# Patient Record
Sex: Male | Born: 1970 | Race: White | Hispanic: No | Marital: Married | Smoking: Never smoker
Health system: Southern US, Community
[De-identification: ages and names within clinical notes are randomized; demographics above are authoritative.]

## PROBLEM LIST (undated history)

## (undated) DIAGNOSIS — M545 Low back pain, unspecified: Secondary | ICD-10-CM

## (undated) DIAGNOSIS — R51 Headache: Secondary | ICD-10-CM

## (undated) DIAGNOSIS — G030 Nonpyogenic meningitis: Secondary | ICD-10-CM

## (undated) DIAGNOSIS — E785 Hyperlipidemia, unspecified: Secondary | ICD-10-CM

## (undated) DIAGNOSIS — A6002 Herpesviral infection of other male genital organs: Secondary | ICD-10-CM

## (undated) DIAGNOSIS — G039 Meningitis, unspecified: Secondary | ICD-10-CM

## (undated) DIAGNOSIS — M7711 Lateral epicondylitis, right elbow: Secondary | ICD-10-CM

## (undated) DIAGNOSIS — T148XXA Other injury of unspecified body region, initial encounter: Secondary | ICD-10-CM

## (undated) DIAGNOSIS — R519 Headache, unspecified: Secondary | ICD-10-CM

## (undated) DIAGNOSIS — H5350 Unspecified color vision deficiencies: Secondary | ICD-10-CM

## (undated) HISTORY — DX: Meningitis, unspecified: G03.9

## (undated) HISTORY — DX: Low back pain, unspecified: M54.50

## (undated) HISTORY — DX: Headache, unspecified: R51.9

## (undated) HISTORY — DX: Other injury of unspecified body region, initial encounter: T14.8XXA

## (undated) HISTORY — DX: Nonpyogenic meningitis: G03.0

## (undated) HISTORY — PX: LATERAL EPICONDYLE RELEASE: SHX1958

## (undated) HISTORY — DX: Low back pain: M54.5

## (undated) HISTORY — DX: Hyperlipidemia, unspecified: E78.5

## (undated) HISTORY — DX: Unspecified color vision deficiencies: H53.50

## (undated) HISTORY — DX: Herpesviral infection of other male genital organs: A60.02

## (undated) HISTORY — DX: Headache: R51

---
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
## 1998-09-19 HISTORY — PX: LUMBAR DISC SURGERY: SHX700

## 1998-10-01 ENCOUNTER — Observation Stay (HOSPITAL_COMMUNITY): Admission: RE | Admit: 1998-10-01 | Discharge: 1998-10-02 | Payer: Self-pay | Admitting: Specialist

## 1998-10-01 ENCOUNTER — Encounter: Payer: Self-pay | Admitting: Specialist

## 1999-04-15 ENCOUNTER — Encounter: Admission: RE | Admit: 1999-04-15 | Discharge: 1999-07-14 | Payer: Self-pay | Admitting: Anesthesiology

## 2006-02-28 ENCOUNTER — Ambulatory Visit: Payer: Self-pay | Admitting: Family Medicine

## 2007-04-30 ENCOUNTER — Ambulatory Visit: Payer: Self-pay | Admitting: Family Medicine

## 2007-05-02 ENCOUNTER — Ambulatory Visit: Payer: Self-pay | Admitting: Family Medicine

## 2007-08-10 ENCOUNTER — Ambulatory Visit (HOSPITAL_COMMUNITY): Admission: RE | Admit: 2007-08-10 | Discharge: 2007-08-10 | Payer: Self-pay | Admitting: Anesthesiology

## 2007-08-23 ENCOUNTER — Encounter: Admission: RE | Admit: 2007-08-23 | Discharge: 2007-09-19 | Payer: Self-pay | Admitting: Neurological Surgery

## 2007-09-20 ENCOUNTER — Encounter: Admission: RE | Admit: 2007-09-20 | Discharge: 2007-10-31 | Payer: Self-pay | Admitting: Neurological Surgery

## 2007-09-20 HISTORY — PX: CERVICAL DISC SURGERY: SHX588

## 2007-10-12 ENCOUNTER — Encounter: Admission: RE | Admit: 2007-10-12 | Discharge: 2007-10-12 | Payer: Self-pay | Admitting: Neurological Surgery

## 2007-12-19 ENCOUNTER — Encounter: Admission: RE | Admit: 2007-12-19 | Discharge: 2007-12-19 | Payer: Self-pay | Admitting: Family Medicine

## 2007-12-19 ENCOUNTER — Ambulatory Visit: Payer: Self-pay | Admitting: Family Medicine

## 2007-12-21 ENCOUNTER — Encounter: Admission: RE | Admit: 2007-12-21 | Discharge: 2008-01-08 | Payer: Self-pay | Admitting: Family Medicine

## 2008-01-03 ENCOUNTER — Ambulatory Visit: Payer: Self-pay | Admitting: Family Medicine

## 2008-01-05 ENCOUNTER — Ambulatory Visit (HOSPITAL_COMMUNITY): Admission: RE | Admit: 2008-01-05 | Discharge: 2008-01-05 | Payer: Self-pay | Admitting: Family Medicine

## 2008-01-17 ENCOUNTER — Observation Stay (HOSPITAL_COMMUNITY): Admission: RE | Admit: 2008-01-17 | Discharge: 2008-01-18 | Payer: Self-pay | Admitting: Neurological Surgery

## 2008-02-19 ENCOUNTER — Encounter: Admission: RE | Admit: 2008-02-19 | Discharge: 2008-02-19 | Payer: Self-pay | Admitting: Neurological Surgery

## 2008-04-21 ENCOUNTER — Encounter: Admission: RE | Admit: 2008-04-21 | Discharge: 2008-04-21 | Payer: Self-pay | Admitting: Neurological Surgery

## 2008-07-20 HISTORY — PX: VASECTOMY: SHX75

## 2008-11-06 ENCOUNTER — Encounter: Admission: RE | Admit: 2008-11-06 | Discharge: 2008-11-06 | Payer: Self-pay | Admitting: Neurological Surgery

## 2009-06-15 ENCOUNTER — Ambulatory Visit: Payer: Self-pay | Admitting: Family Medicine

## 2009-09-07 ENCOUNTER — Ambulatory Visit: Payer: Self-pay | Admitting: Family Medicine

## 2010-02-24 ENCOUNTER — Ambulatory Visit: Payer: Self-pay | Admitting: Family Medicine

## 2010-03-14 ENCOUNTER — Emergency Department (HOSPITAL_COMMUNITY): Admission: EM | Admit: 2010-03-14 | Discharge: 2010-03-14 | Payer: Self-pay | Admitting: Family Medicine

## 2010-03-19 ENCOUNTER — Emergency Department (HOSPITAL_COMMUNITY): Admission: EM | Admit: 2010-03-19 | Discharge: 2010-03-19 | Payer: Self-pay | Admitting: Family Medicine

## 2010-03-23 ENCOUNTER — Ambulatory Visit: Payer: Self-pay | Admitting: Family Medicine

## 2011-01-21 ENCOUNTER — Ambulatory Visit (INDEPENDENT_AMBULATORY_CARE_PROVIDER_SITE_OTHER): Payer: 59 | Admitting: Family Medicine

## 2011-01-21 DIAGNOSIS — L258 Unspecified contact dermatitis due to other agents: Secondary | ICD-10-CM

## 2011-01-24 ENCOUNTER — Other Ambulatory Visit (INDEPENDENT_AMBULATORY_CARE_PROVIDER_SITE_OTHER): Payer: 59

## 2011-01-24 DIAGNOSIS — E7800 Pure hypercholesterolemia, unspecified: Secondary | ICD-10-CM

## 2011-02-01 ENCOUNTER — Telehealth: Payer: Self-pay | Admitting: Family Medicine

## 2011-02-01 NOTE — Telephone Encounter (Signed)
KNAPP ASKED PT TO CALL TO LET HER KNOW HOW HE WAS DOING.  STEROID CREAN HELPED TO CLEAN UP AREA BUT DID NOT CLEAR IT UP. TRIED ANTIFUNGAL CREAM BUT THAT MADE THE PROBLEM SPREAD. TRIED TO GET APPT W/DERMATOLOGIST BUT APPT NOT AVAIL UNTIL 2-3 WEEKS FROM NOW. PLEASE CALL IF HE NEED TO DO SOMETHING ELSE

## 2011-02-01 NOTE — Op Note (Signed)
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   PROCEDURE:  1. Decompressive anterior cervical diskectomy C5-6.  2. Anterior cervical arthrodesis C5-6 utilizing a 8-mm      corticocancellous allograft.  3. Anterior cervical plating C5-6 utilizing a Venture plate.   SURGEON:  Tia Alert, MD   ASSISTANT:  Donalee Citrin, MD   ANESTHESIA:  General endotracheal.   COMPLICATIONS:  None apparent.   INDICATIONS FOR PROCEDURE:  Mr. Arizpe is a very pleasant 40 year old  gentleman, who presented with an acute right C6 radiculopathy with  weakness in his hand and wrist extensors.  He had significant pain in C6  distribution.  He had an MRI which showed a soft disk herniation to the  right at C5-6.  I recommended decompressive anterior cervical  diskectomy, fusion plating at C5-6.  He understood the risks, benefits,  and  expected outcome and wished to proceed.   DESCRIPTION OF PROCEDURE:  The patient was taken to operating room and  after induction of adequate generalized endotracheal anesthesia, he was  placed in a supine position on the operating room table.  His right  anterior cervical region was prepped with DuraPrep and then draped in  usual sterile fashion.  Local anesthesia 5 mL was injected and a  transverse incision was made to the right of midline and carried down to  the platysma which was elevated, opened, and undermined with Metzenbaum  scissors.  I then dissected it in a plane medial to the  sternocleidomastoid muscle and internal carotid artery and lateral to  the trachea and esophagus to expose C5-6.  Intraoperative x-ray  confirmed my level and then the longus colli muscles were taken down and  the shadow line retractors were placed under this to expose C5-6.  We  incised the annulus and performed the initial diskectomy with the  pituitary rongeurs and the curved Karlin curettes.  We then used the  high-speed drill to drill the endplates to prepare for later  arthrodesis.  The operating microscope was brought into the field and  then the posterior and longitudinal ligament was opened with a nerve  hook and then removed in the circumferential fashion with undercutting  the bodies of C5-C6.  Bilateral foraminotomies were performed.  Particular attention was placed in the right foramen because of the  herniated disk.  We did found a significant disk herniation with a free  fragment over the C6 nerve root.  The C6 nerve root was identified by  marching along the pedicle identifying the nerve root and then marching  out pass the pedicle  level utilizing a 1 and 2-mm Kerrison punch.  A  generous foraminotomy was performed.  The nerve root was widely  decompressed.  We then palpated with a nerve hook into the foramen along  the nerve root to assure adequate decompression.  We then  circumferentially palpated to assure adequate decompression at central  canal.  The dura was full all the way across.  We felt like we had a  good decompression of the C6 nerve root in the midline.  We then  irrigated with saline solution.  We dried the surgical bed.  We measured  the interspace with the 8 mm and used an 8-mm corticocancellous  allograft and tapped this into position at C5-6.  We then used a Venture  plate and placed two 13-mm variable angled screws in the bodies of C5-C6  and these locked into plate by a locking mechanism within the plate.  We  then irrigated with saline solution  containing bacitracin, dried all  bleeding points with bipolar cautery, and once meticulous hemostasis was  achieved, closed the platysma with 3-0 Vicryl closing the subcuticular  tissue with 3-0 Vicryl, closed the skin with Benzoin and Steri-Strips.  The drapes were removed.  A sterile dressing was applied.  The patient  awakened from general anesthesia and transferred to recovery room in  stable condition.  At the end of procedure, all sponge, needle, and  instrument counts were correct.      Tia Alert, MD  Electronically Signed     DSJ/MEDQ  D:  01/17/2008  T:  01/17/2008  Job:  [id number]

## 2011-02-02 NOTE — Telephone Encounter (Signed)
Spoke with pt.  He will continue with the clobetasol, and step down to weaker Rx steroid he has if clearing up significantly.  Avoid using antifungal (made it worse).  F/u with derm in 2-3 weeks if not improving/resolving for consult (I rec make appt, cancel if better).

## 2011-05-29 ENCOUNTER — Inpatient Hospital Stay (INDEPENDENT_AMBULATORY_CARE_PROVIDER_SITE_OTHER)
Admission: RE | Admit: 2011-05-29 | Discharge: 2011-05-29 | Disposition: A | Discharge status: Home / Self Care | Payer: 59 | Source: Ambulatory Visit | Attending: Family Medicine | Admitting: Family Medicine

## 2011-05-29 DIAGNOSIS — J020 Streptococcal pharyngitis: Secondary | ICD-10-CM

## 2011-06-14 LAB — DIFFERENTIAL
Basophils Absolute: 0
Lymphocytes Relative: 29
Neutro Abs: 4.6
Neutrophils Relative %: 58

## 2011-06-14 LAB — BASIC METABOLIC PANEL
BUN: 11
Calcium: 9.4
GFR calc non Af Amer: >60
Glucose, Bld: 91
Sodium: 140

## 2011-06-14 LAB — CBC
Platelets: 198
RDW: 13.3
WBC: 8

## 2011-06-14 LAB — PROTIME-INR
INR: 1
Prothrombin Time: 12.9

## 2011-06-14 LAB — APTT: aPTT: 30

## 2011-06-22 ENCOUNTER — Encounter: Payer: Self-pay | Admitting: Family Medicine

## 2011-06-23 ENCOUNTER — Encounter: Payer: Self-pay | Admitting: Family Medicine

## 2011-06-23 ENCOUNTER — Ambulatory Visit (INDEPENDENT_AMBULATORY_CARE_PROVIDER_SITE_OTHER): Payer: 59 | Admitting: Family Medicine

## 2011-06-23 VITALS — BP 136/96 | HR 64 | Ht 72.0 in | Wt 220.0 lb

## 2011-06-23 DIAGNOSIS — IMO0001 Reserved for inherently not codable concepts without codable children: Secondary | ICD-10-CM

## 2011-06-23 DIAGNOSIS — G4700 Insomnia, unspecified: Secondary | ICD-10-CM

## 2011-06-23 DIAGNOSIS — Z0000 Encounter for general adult medical examination without abnormal findings: Secondary | ICD-10-CM

## 2011-06-23 DIAGNOSIS — E7800 Pure hypercholesterolemia, unspecified: Secondary | ICD-10-CM

## 2011-06-23 DIAGNOSIS — M545 Low back pain, unspecified: Secondary | ICD-10-CM

## 2011-06-23 DIAGNOSIS — R030 Elevated blood-pressure reading, without diagnosis of hypertension: Secondary | ICD-10-CM

## 2011-06-23 LAB — POCT URINALYSIS DIPSTICK
Ketones, UA: NEGATIVE
Leukocytes, UA: NEGATIVE
Nitrite, UA: NEGATIVE
Protein, UA: NEGATIVE
Urobilinogen, UA: NEGATIVE
pH, UA: 7

## 2011-06-23 MED ORDER — ZOLPIDEM TARTRATE 10 MG PO TABS: 10.0000 mg | ORAL_TABLET | Freq: Every evening | ORAL | Status: DC | PRN

## 2011-06-23 NOTE — Progress Notes (Signed)
Larry Cruz is a 40 y.o. male who presents for a complete physical.  He has the following concerns: He saw dermatologist for rash on hand, which after a skin scraping was diagnosed as ringworm.  He had already failed topical therapy, so was placed on oral antifungals, and required 2 months of treatment.  Is significantly improved.  Stopped the Lipitor during the time he was on anti-fungals, and has only restarted about 2 weeks ago.  Last lipids were 01/2011  Intermittently has problems with low back pain (since his surgery).  He finds that if he just gets some good sleep with Ambien, back improves on its own; rarely uses Flexeril or Rx anti-inflammatories (both prescribed for him in the past)  There is no immunization history on file for this patient. Got flu shot at work earlier this week.  Believes tetanus is UTD--will check with employee health Last colonoscopy: never Last PSA: never Dentist: twice yearly Ophtho: 2-3 years ago Exercise: Not much recently  Past Medical History  Diagnosis Date  . 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 Dyslipidemia   . Low back pain     Past Surgical History  Procedure Date  . Vasectomy 11/09  . Cervical disc surgery 2009    JONES, MD; fusion  . Lumbar disc surgery 2007    CARTER, MD; L4-L5    History   Social History  . Marital Status: Married    Spouse Name: N/A    Number of Children: 2  . Years of Education: N/A   Occupational History  . Rehab director [employer]   Social History Main Topics  . Smoking status: Never Smoker   . Smokeless tobacco: Never Used  . Alcohol Use: Yes     1 drink 3-4 times per week.  . Drug Use: No  . Sexually Active: Yes -- Male partner(s)    Birth Control/ Protection: Surgical     vasectomy   Other Topics Concern  . Not on file   Social History Narrative   Lives with wife, daughter, son and dog    Family History  Problem Relation Age of Onset  . Heart disease Father     CABG in mid 40's  .  Hypertension Father   . Alzheimer's disease Father   . Hyperlipidemia Father   . Diabetes Father   . Hyperlipidemia Mother   . Hyperlipidemia Sister   . Hyperlipidemia Brother   . Hypertension Brother   . Hyperlipidemia Brother   . Hyperlipidemia Sister   . Hyperlipidemia Sister   . Hyperlipidemia Sister   . Hyperlipidemia Sister   . Hyperlipidemia Sister   . Hyperlipidemia Sister     Current outpatient prescriptions:atorvastatin (LIPITOR) 20 MG tablet, Take 20 mg by mouth daily.  , Disp: , Rfl: ;  zolpidem (AMBIEN) 10 MG tablet, Take 1 tablet (10 mg total) by mouth at bedtime as needed., Disp: 30 tablet, Rfl: 1  No Known Allergies  ROS: The patient denies anorexia, fever, weight changes, headaches,  vision loss, decreased hearing, ear pain, hoarseness, chest pain, palpitations, dizziness, syncope, dyspnea on exertion, cough, swelling, nausea, vomiting, diarrhea, abdominal pain, melena, indigestion/heartburn, hematuria, incontinence, erectile dysfunction, nocturia, weakened urine stream, dysuria, genital lesions, joint pains, numbness, tingling, weakness, tremor, suspicious skin lesions, depression, anxiety, abnormal bleeding/bruising, or enlarged lymph nodes +constipation, intermittent BRB, known hemorrhoid in past; intermittent LBP relieved by OTC meds. Previously used Flexeril and rx NSAID's, not needed recently.  Rash L hand resolving.  Some pain in  knees, ankles, shoulder  PHYSICAL EXAM: BP 128/90  Pulse 64  Ht 6' (1.829 m)  Wt 220 lb (99.791 kg)  BMI 29.84 kg/m2  General Appearance:    Alert, cooperative, no distress, appears stated age  Head:    Normocephalic, without obvious abnormality, atraumatic  Eyes:    PERRL, conjunctiva/corneas clear, EOM's intact, fundi    benign  Ears:    Normal TM's and external ear canals  Nose:   Nares normal, mucosa normal, no drainage or sinus   tenderness  Throat:   Lips, mucosa, and tongue normal; teeth and gums normal  Neck:   Supple, no  lymphadenopathy;  thyroid:  no   enlargement/tenderness/nodules; no carotid   bruit or JVD  Back:    Spine nontender, no curvature, ROM normal, no CVA     tenderness  Lungs:     Clear to auscultation bilaterally without wheezes, rales or     ronchi; respirations unlabored  Chest Wall:    No tenderness or deformity   Heart:    Regular rate and rhythm, S1 and S2 normal, no murmur, rub   or gallop  Breast Exam:    No chest wall tenderness, masses or gynecomastia  Abdomen:     Soft, non-tender, nondistended, normoactive bowel sounds,    no masses, no hepatosplenomegaly. Abdominal obesity  Genitalia:    Normal male external genitalia without lesions.  Testicles without masses.  No inguinal hernias.  Rectal:    Normal sphincter tone, no masses or tenderness; guaiac negative stool.  Prostate smooth, no nodules, not enlarged.  Extremities:   No clubbing, cyanosis or edema  Pulses:   2+ and symmetric all extremities  Skin:   Skin color, texture, turgor normal, no rashes or lesions. Slight residual discoloration at L hand, otherwise normal  Lymph nodes:   Cervical, supraclavicular, and axillary nodes normal  Neurologic:   CNII-XII intact, normal strength, sensation and gait; reflexes 1+ and symmetric throughout          Psych:   Normal mood, affect, hygiene and grooming.     ASSESSMENT/PLAN: 1. Routine general medical examination at a health care facility  POCT Urinalysis Dipstick, Visual acuity screening  2. Insomnia  zolpidem (AMBIEN) 10 MG tablet  3. Pure hypercholesterolemia    4. Elevated BP    5. Lumbago     Lipids, glu and lft's recommended for spring (April/May)  PSA screening at age 49, recommended at least 30 minutes of aerobic activity at least 5 days/week; proper sunscreen use reviewed; healthy diet and alcohol recommendations (less than or equal to 2 drinks/day) reviewed; regular seatbelt use; changing batteries in smoke detectors. Self-testicular exams. Immunization recommendations  discussed.  Colonoscopy recommendations reviewed--age 41, sooner if anyone in family with polyps.  TdaP recommended if he hasn't already had. Will check employee health records and set up NV if needed  Exercise, weight loss, check BP elsewhere. F/u 6 months--on BP and due for labs

## 2011-06-23 NOTE — Patient Instructions (Addendum)
HEALTH MAINTENANCE RECOMMENDATIONS:  It is recommended that you get at least 30 minutes of aerobic exercise at least 5 days/week (for weight loss, you may need as much as 60-90 minutes). This can be any activity that gets your heart rate up. This can be divided in 10-15 minute intervals if needed, but try and build up your endurance at least once a week.  Weight bearing exercise is also recommended twice weekly.  Eat a healthy diet with lots of vegetables, fruits and fiber.  "Colorful" foods have a lot of vitamins (ie green vegetables, tomatoes, red peppers, etc).  Limit sweet tea, regular sodas and alcoholic beverages, all of which has a lot of calories and sugar.  Up to 2 alcoholic drinks daily may be beneficial for men (unless trying to lose weight, watch sugars).  Drink a lot of water.  Sunscreen of at least SPF 30 should be used on all sun-exposed parts of the skin when outside between the hours of 10 am and 4 pm (not just when at beach or pool, but even with exercise, golf, tennis, and yard work!)  Use a sunscreen that says "broad spectrum" so it covers both UVA and UVB rays, and make sure to reapply every 1-2 hours.  Remember to change the batteries in your smoke detectors when changing your clock times in the spring and fall.  Use your seat belt every time you are in a car, and please drive safely and not be distracted with cell phones and texting while driving.  Periodically check your testicles in the shower as instructed.  Please check with employee health regarding date of last tetanus, and whether it was Td or TdaP (TdaP is recommended if you never had one) Check blood pressure periodically and keep a list--bring to your appt in 6 months

## 2011-10-26 ENCOUNTER — Other Ambulatory Visit: Payer: Self-pay | Admitting: Family Medicine

## 2011-12-28 ENCOUNTER — Ambulatory Visit: Payer: 59 | Admitting: Family Medicine

## 2012-07-25 ENCOUNTER — Telehealth: Payer: Self-pay | Admitting: Family Medicine

## 2012-07-25 NOTE — Telephone Encounter (Signed)
Spoke with patient and he scheduled for 10/22/12, called in #30 with no refills.

## 2012-07-25 NOTE — Telephone Encounter (Signed)
I was going to refuse this medication, wanted to double check with you first. Pt last seen 06/23/11 for CPE and cx 12/28/11 appt.  Thanks.

## 2012-07-25 NOTE — Telephone Encounter (Signed)
Pt not seen in a year, but has only used 60 tabs in entire year.  Call him to schedule CPE, and if he schedules, okay to give #30, no refill

## 2012-08-06 ENCOUNTER — Telehealth: Payer: Self-pay | Admitting: *Deleted

## 2012-08-06 NOTE — Telephone Encounter (Signed)
Error

## 2012-10-05 ENCOUNTER — Other Ambulatory Visit: Payer: Self-pay | Admitting: Family Medicine

## 2012-10-22 ENCOUNTER — Ambulatory Visit (INDEPENDENT_AMBULATORY_CARE_PROVIDER_SITE_OTHER): Payer: 59 | Admitting: Family Medicine

## 2012-10-22 ENCOUNTER — Encounter: Payer: Self-pay | Admitting: Family Medicine

## 2012-10-22 VITALS — BP 138/84 | HR 64 | Ht 73.0 in | Wt 225.0 lb

## 2012-10-22 DIAGNOSIS — R002 Palpitations: Secondary | ICD-10-CM

## 2012-10-22 DIAGNOSIS — E7800 Pure hypercholesterolemia, unspecified: Secondary | ICD-10-CM

## 2012-10-22 DIAGNOSIS — Z0000 Encounter for general adult medical examination without abnormal findings: Secondary | ICD-10-CM

## 2012-10-22 DIAGNOSIS — R030 Elevated blood-pressure reading, without diagnosis of hypertension: Secondary | ICD-10-CM

## 2012-10-22 DIAGNOSIS — Z23 Encounter for immunization: Secondary | ICD-10-CM

## 2012-10-22 DIAGNOSIS — IMO0001 Reserved for inherently not codable concepts without codable children: Secondary | ICD-10-CM

## 2012-10-22 LAB — LIPID PANEL
HDL: 53 mg/dL (ref >39)
LDL Cholesterol: 130 mg/dL — ABNORMAL HIGH (ref 0–99)

## 2012-10-22 LAB — COMPREHENSIVE METABOLIC PANEL
ALT: 29 U/L (ref 0–53)
Alkaline Phosphatase: 74 U/L (ref 39–117)
CO2: 28 mEq/L (ref 19–32)
Sodium: 141 mEq/L (ref 135–145)
Total Bilirubin: 0.9 mg/dL (ref 0.3–1.2)
Total Protein: 7.4 g/dL (ref 6.0–8.3)

## 2012-10-22 LAB — POCT URINALYSIS DIPSTICK
Bilirubin, UA: NEGATIVE
Blood, UA: NEGATIVE
Nitrite, UA: NEGATIVE
pH, UA: 6

## 2012-10-22 LAB — HEMOCCULT GUIAC POC 1CARD (OFFICE)

## 2012-10-22 NOTE — Patient Instructions (Addendum)
HEALTH MAINTENANCE RECOMMENDATIONS:  It is recommended that you get at least 30 minutes of aerobic exercise at least 5 days/week (for weight loss, you may need as much as 60-90 minutes). This can be any activity that gets your heart rate up. This can be divided in 10-15 minute intervals if needed, but try and build up your endurance at least once a week.  Weight bearing exercise is also recommended twice weekly.  Eat a healthy diet with lots of vegetables, fruits and fiber.  "Colorful" foods have a lot of vitamins (ie green vegetables, tomatoes, red peppers, etc).  Limit sweet tea, regular sodas and alcoholic beverages, all of which has a lot of calories and sugar.  Up to 2 alcoholic drinks daily may be beneficial for men (unless trying to lose weight, watch sugars).  Drink a lot of water.  Sunscreen of at least SPF 30 should be used on all sun-exposed parts of the skin when outside between the hours of 10 am and 4 pm (not just when at beach or pool, but even with exercise, golf, tennis, and yard work!)  Use a sunscreen that says "broad spectrum" so it covers both UVA and UVB rays, and make sure to reapply every 1-2 hours.  Remember to change the batteries in your smoke detectors when changing your clock times in the spring and fall.  Use your seat belt every time you are in a car, and please drive safely and not be distracted with cell phones and texting while driving.  Elevated BP--continue to monitor.  Goal is <135/85.  Continue low sodium diet, daily exercise, and weight loss is encouraged.  Palpitations with exercise--increase fluid intake, and limit caffeine.  If ongoing problems, needs referral to cardiologist for further evaluation.  Please call us if this persists, for referral.

## 2012-10-22 NOTE — Progress Notes (Signed)
Chief Complaint  Patient presents with  . Annual Exam    fasting annual exam. Has joint pain in his feet and knees, notices when he is more active. Standing makes his feet hurt. Was having some heart flutters and had them do an EKG at work, said it was okay. Notices this when he get "real into" a tennis game.   Larry Cruz is a 42 y.o. male who presents for a complete physical.  He is also here to f/u on hyperlipidemia:  He is past due for labs, is fasting today.  Compliant with meds and diet, denies side effects.  November--noticed a muscle spasm/quivering in chest.  Had EKG done at work, which was reportedly okay.  Pulse was a little irregular; had more caffeine that day.  Sometimes feels similar when playing singles in tennis, only when playing hard. Doesn't notice with other physical exertion.  Denies chest tightness/pain/pressure.  Health Maintenance: Immunization History  Administered Date(s) Administered  . Influenza Split 07/03/2012  . Tdap 10/22/2012  believes tetanus was early 2000's (poss 2003) through employee health Last colonoscopy: never  Last PSA: never  Dentist: twice yearly  Ophtho: 3-4 years ago  Exercise: weight class once weekly; tennis 1-3x/week (only once recently)  Checks BP periodically elsewhere, and runs high 120's to low 140's/mid-70's-80's.    ROS:  The patient denies anorexia, fever, weight changes, headaches,  vision loss, decreased hearing, ear pain, hoarseness, chest pain, dizziness, syncope, dyspnea on exertion, cough, swelling, nausea, vomiting, diarrhea, abdominal pain, melena, hematochezia, indigestion/heartburn, hematuria, incontinence, erectile dysfunction, nocturia, weakened urine stream, dysuria, genital lesions, joint pains, numbness, tingling, weakness, tremor, suspicious skin lesions, depression, anxiety, abnormal bleeding/bruising, or enlarged lymph nodes. Some constipation, chronic. Insomnia--uses ambien on average once a week, but more  typically may need it 2-3x/week, then go 6 weeks without needing it.  PHYSICAL EXAM: BP 138/84  Pulse 64  Ht 6\' 1"  (1.854 m)  Wt 225 lb (102.059 kg)  BMI 29.69 kg/m2 138/84 on repeat by MD RA General Appearance:    Alert, cooperative, no distress, appears stated age  Head:    Normocephalic, without obvious abnormality, atraumatic  Eyes:    PERRL, conjunctiva/corneas clear, EOM's intact, fundi    benign  Ears:    Normal TM's and external ear canals  Nose:   Nares normal, mucosa normal, no drainage or sinus   tenderness  Throat:   Lips, mucosa, and tongue normal; teeth and gums normal  Neck:   Supple, no lymphadenopathy;  thyroid:  no   enlargement/tenderness/nodules; no carotid   bruit or JVD  Back:    Spine nontender, no curvature, ROM normal, no CVA     tenderness  Lungs:     Clear to auscultation bilaterally without wheezes, rales or     ronchi; respirations unlabored  Chest Wall:    No tenderness or deformity   Heart:    Regular rate and rhythm, S1 and S2 normal, no murmur, rub   or gallop  Breast Exam:    No chest wall tenderness, masses or gynecomastia  Abdomen:     Soft, non-tender, nondistended, normoactive bowel sounds,    no masses, no hepatosplenomegaly. Abdominal obesity.  Genitalia:    Normal male external genitalia without lesions.  Testicles without masses.  No inguinal hernias.  Rectal:    Normal sphincter tone, no masses or tenderness; guaiac negative stool.  Prostate smooth, no nodules, not enlarged.  Extremities:   No clubbing, cyanosis or edema  Pulses:  2+ and symmetric all extremities  Skin:   Skin color, texture, turgor normal, no rashes or lesions  Lymph nodes:   Cervical, supraclavicular, and axillary nodes normal  Neurologic:   CNII-XII intact, normal strength, sensation and gait; reflexes 2+ and symmetric throughout          Psych:   Normal mood, affect, hygiene and grooming.     ASSESSMENT/PLAN:  1. Routine general medical examination at a health care  facility  Visual acuity screening, POCT Urinalysis Dipstick, Hemoccult - 1 Card (office)  2. Pure hypercholesterolemia  Lipid panel, Comprehensive metabolic panel, atorvastatin (LIPITOR) 20 MG tablet  3. Need for Tdap vaccination  Tdap vaccine greater than or equal to 7yo IM  4. Elevated blood pressure  Comprehensive metabolic panel  5. Palpitations  TSH    Palpitations with exercise--increase fluid intake, and limit caffeine.  If ongoing problems, needs referral to cardiologist for further evaluation.    Elevated BP--continue to monitor, lower at home, some white coat component.  Goal is <135/85.  Continue low sodium diet, daily exercise, and weight loss is encouraged.  Recommended at least 30 minutes of aerobic activity at least 5 days/week; proper sunscreen use reviewed; healthy diet and alcohol recommendations (less than or equal to 2 drinks/day) reviewed; regular seatbelt use; changing batteries in smoke detectors. Self-testicular exams. Immunization recommendations discussed--TdaP given today.  Colonoscopy recommendations reviewed--age 74.

## 2012-10-23 LAB — TSH: TSH: 1.168 u[IU]/mL (ref 0.350–4.500)

## 2012-10-23 MED ORDER — ATORVASTATIN CALCIUM 20 MG PO TABS: 20.0000 mg | ORAL_TABLET | Freq: Every day | ORAL | Status: DC

## 2013-01-10 ENCOUNTER — Encounter: Payer: Self-pay | Admitting: Family Medicine

## 2013-01-10 ENCOUNTER — Ambulatory Visit (INDEPENDENT_AMBULATORY_CARE_PROVIDER_SITE_OTHER): Payer: 59 | Admitting: Family Medicine

## 2013-01-10 VITALS — BP 132/90 | HR 64 | Ht 72.25 in | Wt 202.0 lb

## 2013-01-10 DIAGNOSIS — M545 Low back pain, unspecified: Secondary | ICD-10-CM

## 2013-01-10 DIAGNOSIS — M5416 Radiculopathy, lumbar region: Secondary | ICD-10-CM

## 2013-01-10 DIAGNOSIS — IMO0002 Reserved for concepts with insufficient information to code with codable children: Secondary | ICD-10-CM

## 2013-01-10 MED ORDER — METHYLPREDNISOLONE 4 MG PO KIT: PACK | ORAL | Status: DC

## 2013-01-10 NOTE — Progress Notes (Signed)
Chief Complaint  Patient presents with  . back and hip pain    2 week ago fell on a flight of stair on spine, back and right hip pain and now constanly   Slipped on stairs about 2 weeks ago (wearing socks on hard wood).  The corner of step hit his L4-L5 area--landed straight on this area, from standing position, to about 2 stairs lower, on his low back. Had immediate onset of pain, with some swelling and bruising.  Pain has worsened in the last 48 hours.  He has some pain radiating down his right leg (lateral lower leg)--burning pain.  He has had some pain in this area since his surgery, but is worse now.  He is complaining of right hip pain.  Pain is across right low back, into buttock and right hip, some pain anteriorly.  Pain seems to be getting worse lying down and sitting, feels better standing.  Icing helps some.  Has taken ibuprofen 200-600mg  just a few times (10 pills total).  Saw some blood in the stool about a week ago, which he tends to get only when takes ibuprofen. He has also taken naproxen once daily, and tylenol every other day. He can't notice any significant improvement with these pain meds.  He has a dull ache across entire lower lumbar area  He has a h/o low back surgery, in 1999 or 2000.  Had ongoing problems since the surgery.  Had MRI in 2008, and epidural in 2009, with good results until this recent fall.  Last MRI (2008): IMPRESSION:  1. Previous posterior decompressive surgery at L4-5. Moderate broad-based disc protrusion more pronounced towards the right. Narrowing of the lateral recesses, right more than left. Either L5 nerve root could be affected at this level, more likely the right.  2. Broad-based disc protrusion at L5-S1 with caudal down-turning of disc material. Subarticular lateral recesses are narrowed and disc material abuts the S1 nerve roots. Either could be irritated though neither appears grossly compressed.   He spoke with Dr. Marikay Alar today, who suggested  repeat MRI.   Patient denies any weakness, bowel or bladder dysfunction.  Past Medical History  Diagnosis Date  . 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 Dyslipidemia   . Low back pain   . Colorblind    Past Surgical History  Procedure Laterality Date  . Vasectomy  11/09  . Cervical disc surgery  2009    JONES, MD; fusion  . Lumbar disc surgery  2007    CARTER, MD; L4-L5   History   Social History  . Marital Status: Married    Spouse Name: N/A    Number of Children: 2  . Years of Education: N/A   Occupational History  . 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 Smoking status: Never Smoker   . Smokeless tobacco: Never Used  . Alcohol Use: Yes     Comment: 1 drink 3-4 times per week.  . Drug Use: No  . Sexually Active: Yes -- Male partner(s)    Birth Control/ Protection: Surgical     Comment: vasectomy   Other Topics Concern  . Not on file   Social History Narrative   Lives with wife, daughter, son and dog   Current Outpatient Prescriptions on File Prior to Visit  Medication Sig Dispense Refill  . atorvastatin (LIPITOR) 20 MG tablet Take 1 tablet (20 mg total) by mouth daily.  90 tablet  3  . zolpidem (  AMBIEN) 10 MG tablet TAKE 1 TABLET BY MOUTH AT BEDTIME AS NEEDED  30 tablet  0   No current facility-administered medications on file prior to visit.   No Known Allergies  ROS:  Denies fevers, rash, bleeding/bruising, numbness/tingling/weakness, urinary complaints, bowel changes.  +back pain, radiating into RLE as per HPI.  No chest pain, shortness of breath, URI symptoms or other complaints.  PHYSICAL EXAM BP 132/90  Pulse 64  Ht 6' 0.25" (1.835 m)  Wt 202 lb (91.627 kg)  BMI 27.21 kg/m2 Well developed, pleasant male, who doesn't appear to be in acute distress Spine: Mildly diffusely tender across lower back.  No focal tenderness at spine, SI joint or sciatic notch.  NO CVA tenderness. No pyriformis spasm.  Strength 5/5, normal  sensation. ?decreased/absent reflex at R ankle, only 1+ at left Skin: no bruising/rash   ASSESSMENT/PLAN:  Lumbar radiculopathy - Plan: DG Lumbar Spine Complete, methylPREDNISolone (MEDROL, PAK,) 4 MG tablet  Lumbago - Plan: DG Lumbar Spine Complete  Discussed treatments including NSAIDs vs steroids.  Elects for a course of oral steroids, hoping for some relief in symptoms while awaiting other studies and determination for MRI.  If no improvement with steroids, to call for MRI.  Will start with regular x-rays given acute trauma, r/o fracture (compression). Risks and side effects of oral steroids reviewed, not to take NSAIDs along with med.  May call for stronger pain meds if needed (declines today).  Pt to call and let us know if symptoms persist, for MRI.  Will f/u with Dr. Yetta Barre sooner if develops worsening neuro symptoms/weakness.

## 2013-01-10 NOTE — Patient Instructions (Addendum)
Get x-rays at the hospital.  Start steroid dosepak--take as directed.  If symptoms not improving, call and we will get you scheduled for MRI.

## 2013-01-11 ENCOUNTER — Encounter: Payer: Self-pay | Admitting: Family Medicine

## 2013-01-11 ENCOUNTER — Ambulatory Visit (HOSPITAL_COMMUNITY)
Admission: RE | Admit: 2013-01-11 | Discharge: 2013-01-11 | Disposition: A | Discharge status: Home / Self Care | Payer: 59 | Source: Ambulatory Visit | Attending: Family Medicine | Admitting: Family Medicine

## 2013-01-11 DIAGNOSIS — M5416 Radiculopathy, lumbar region: Secondary | ICD-10-CM

## 2013-01-11 DIAGNOSIS — M545 Low back pain, unspecified: Secondary | ICD-10-CM | POA: Insufficient documentation

## 2013-01-11 DIAGNOSIS — M79609 Pain in unspecified limb: Secondary | ICD-10-CM | POA: Insufficient documentation

## 2013-01-11 DIAGNOSIS — M51379 Other intervertebral disc degeneration, lumbosacral region without mention of lumbar back pain or lower extremity pain: Secondary | ICD-10-CM | POA: Insufficient documentation

## 2013-01-11 DIAGNOSIS — W19XXXA Unspecified fall, initial encounter: Secondary | ICD-10-CM | POA: Insufficient documentation

## 2013-01-11 DIAGNOSIS — M5137 Other intervertebral disc degeneration, lumbosacral region: Secondary | ICD-10-CM | POA: Insufficient documentation

## 2013-03-02 ENCOUNTER — Emergency Department (HOSPITAL_COMMUNITY)
Admission: EM | Admit: 2013-03-02 | Discharge: 2013-03-02 | Disposition: A | Discharge status: Home / Self Care | Payer: 59 | Source: Home / Self Care

## 2013-03-02 ENCOUNTER — Encounter (HOSPITAL_COMMUNITY): Payer: Self-pay | Admitting: Emergency Medicine

## 2013-03-02 DIAGNOSIS — L255 Unspecified contact dermatitis due to plants, except food: Secondary | ICD-10-CM

## 2013-03-02 DIAGNOSIS — L237 Allergic contact dermatitis due to plants, except food: Secondary | ICD-10-CM

## 2013-03-02 MED ORDER — LORATADINE 10 MG PO TABS: ORAL_TABLET | ORAL | Status: DC

## 2013-03-02 MED ORDER — PREDNISONE 10 MG PO TABS: 20.0000 mg | ORAL_TABLET | Freq: Every day | ORAL | Status: DC

## 2013-03-02 MED ORDER — DIPHENHYDRAMINE HCL 25 MG PO TABS: ORAL_TABLET | ORAL | Status: DC

## 2013-03-02 NOTE — ED Provider Notes (Signed)
History     CSN: [id number]  Arrival date & time 03/02/13  1249   First MD Initiated Contact with Patient 03/02/13 1353      Chief Complaint  Patient presents with  . Poison Ivy    (Consider location/radiation/quality/duration/timing/severity/associated sxs/prior treatment) Patient is a 42 y.o. male presenting with poison ivy.  Poison Lajoyce Corners This is a new problem. The current episode started 2 days ago. The problem has not changed since onset.Pertinent negatives include no chest pain, no abdominal pain, no headaches and no shortness of breath. Nothing aggravates the symptoms. The symptoms are relieved by medications (benadryl).    Past Medical History  Diagnosis Date  . 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 Dyslipidemia   . Low back pain   . Colorblind     Past Surgical History  Procedure Laterality Date  . Vasectomy  11/09  . Cervical disc surgery  2009    JONES, MD; fusion  . Lumbar disc surgery  2007    CARTER, MD; L4-L5    Family History  Problem Relation Age of Onset  . Heart disease Father     CABG in mid 11's  . Hypertension Father   . Alzheimer's disease Father   . Hyperlipidemia Father   . Diabetes Father   . Hyperlipidemia Mother   . Hyperlipidemia Sister   . Hyperlipidemia Brother   . Hypertension Brother   . Hyperlipidemia Brother   . Hyperlipidemia Sister   . Hyperlipidemia Sister   . Hyperlipidemia Sister   . Hyperlipidemia Sister   . Hyperlipidemia Sister   . Hyperlipidemia Sister     History  Substance Use Topics  . Smoking status: Never Smoker   . Smokeless tobacco: Never Used  . Alcohol Use: Yes     Comment: 1 drink 3-4 times per week.      Review of Systems  Respiratory: Negative for shortness of breath.   Cardiovascular: Negative for chest pain.  Gastrointestinal: Negative for abdominal pain.  Neurological: Negative for headaches.  All other systems reviewed and are negative.    Allergies  Review of patient's allergies indicates no  known allergies.  Home Medications   Current Outpatient Rx  Name  Route  Sig  Dispense  Refill  . atorvastatin (LIPITOR) 20 MG tablet   Oral   Take 1 tablet (20 mg total) by mouth daily.   90 tablet   3   . diphenhydrAMINE (BENADRYL) 25 MG tablet      1-2 tablets at bedtime when needed   20 tablet   0   . loratadine (CLARITIN) 10 MG tablet      1 tablet once a day for 10 days when needed   90 tablet   1   . methylPREDNISolone (MEDROL, PAK,) 4 MG tablet      follow package directions   21 tablet   0   . predniSONE (DELTASONE) 10 MG tablet   Oral   Take 2 tablets (20 mg total) by mouth daily.   5 tablet   0   . zolpidem (AMBIEN) 10 MG tablet      TAKE 1 TABLET BY MOUTH AT BEDTIME AS NEEDED   30 tablet   0     BP 114/63  Pulse 64  Temp(Src) 98.2 F (36.8 C) (Oral)  Resp 14  Physical Exam  Nursing note and vitals reviewed. Constitutional: He is oriented to person, place, and time. He appears well-developed and well-nourished.  HENT:  Head: Normocephalic and  atraumatic.  Eyes: Conjunctivae are normal. Pupils are equal, round, and reactive to light.  Neck: Normal range of motion. Neck supple. No JVD present. No thyromegaly present.  erythmatic raised lesions nec, and two in left face  Cardiovascular: Normal rate, normal heart sounds and intact distal pulses.   Pulmonary/Chest: Effort normal and breath sounds normal.  Lymphadenopathy:    He has no cervical adenopathy.  Neurological: He is alert and oriented to person, place, and time.  Psychiatric: He has a normal mood and affect. His behavior is normal.    ED Course  Procedures (including critical care time)  Labs Reviewed - No data to display No results found.   1. Poison ivy       MDM  Discussed meds and tx.  SE and precautions reviewed        Maryelizabeth Rowan, MD 03/02/13 1404

## 2013-03-02 NOTE — ED Notes (Signed)
Pt c/o poss poison ivy rash onset Thursday... Rash on neck, side of face and groin area... Denies: fevers Has taken benadryl for sxs... He is alert and oriented w/no signs of acute distress.

## 2013-03-13 ENCOUNTER — Other Ambulatory Visit: Payer: Self-pay | Admitting: Family Medicine

## 2013-03-13 NOTE — Telephone Encounter (Signed)
I called out Ambien to the pharmacy per Crosby Oyster PA-C. CLS

## 2013-03-13 NOTE — Telephone Encounter (Signed)
Is it okay to refill?

## 2013-07-25 ENCOUNTER — Other Ambulatory Visit: Payer: Self-pay

## 2013-08-21 ENCOUNTER — Other Ambulatory Visit: Payer: Self-pay | Admitting: Neurological Surgery

## 2013-08-21 DIAGNOSIS — M5126 Other intervertebral disc displacement, lumbar region: Secondary | ICD-10-CM

## 2013-08-22 ENCOUNTER — Ambulatory Visit
Admission: RE | Admit: 2013-08-22 | Discharge: 2013-08-22 | Disposition: A | Discharge status: Home / Self Care | Payer: 59 | Source: Ambulatory Visit | Attending: Neurological Surgery | Admitting: Neurological Surgery

## 2013-08-22 DIAGNOSIS — M5126 Other intervertebral disc displacement, lumbar region: Secondary | ICD-10-CM

## 2013-08-23 ENCOUNTER — Other Ambulatory Visit: Payer: Self-pay | Admitting: Neurological Surgery

## 2013-08-23 ENCOUNTER — Other Ambulatory Visit: Payer: 59

## 2013-08-23 DIAGNOSIS — M545 Low back pain, unspecified: Secondary | ICD-10-CM

## 2013-08-27 ENCOUNTER — Ambulatory Visit
Admission: RE | Admit: 2013-08-27 | Discharge: 2013-08-27 | Disposition: A | Discharge status: Home / Self Care | Payer: 59 | Source: Ambulatory Visit | Attending: Neurological Surgery | Admitting: Neurological Surgery

## 2013-08-27 DIAGNOSIS — M545 Low back pain, unspecified: Secondary | ICD-10-CM

## 2013-08-27 MED ORDER — IOHEXOL 180 MG/ML  SOLN
1.0000 mL | Freq: Once | INTRAMUSCULAR | Status: AC | PRN
  Administered 2013-08-27: 1 mL via EPIDURAL

## 2013-08-27 MED ORDER — METHYLPREDNISOLONE ACETATE 40 MG/ML INJ SUSP (RADIOLOG
120.0000 mg | Freq: Once | INTRAMUSCULAR | Status: AC
  Administered 2013-08-27: 120 mg via EPIDURAL

## 2013-09-04 ENCOUNTER — Other Ambulatory Visit: Payer: Self-pay | Admitting: Neurological Surgery

## 2013-09-04 DIAGNOSIS — M545 Low back pain, unspecified: Secondary | ICD-10-CM

## 2013-09-05 ENCOUNTER — Encounter: Payer: Self-pay | Admitting: Family Medicine

## 2013-09-05 ENCOUNTER — Other Ambulatory Visit: Payer: Self-pay | Admitting: Medical

## 2013-09-05 MED ORDER — ZOLPIDEM TARTRATE 10 MG PO TABS: ORAL_TABLET | ORAL | Status: DC

## 2013-09-05 NOTE — Telephone Encounter (Signed)
Done

## 2013-09-09 ENCOUNTER — Ambulatory Visit
Admission: RE | Admit: 2013-09-09 | Discharge: 2013-09-09 | Disposition: A | Discharge status: Home / Self Care | Payer: 59 | Source: Ambulatory Visit | Attending: Neurological Surgery | Admitting: Neurological Surgery

## 2013-09-09 DIAGNOSIS — M545 Low back pain, unspecified: Secondary | ICD-10-CM

## 2013-09-09 MED ORDER — IOHEXOL 180 MG/ML  SOLN
1.0000 mL | Freq: Once | INTRAMUSCULAR | Status: AC | PRN
  Administered 2013-09-09: 1 mL via EPIDURAL

## 2013-09-09 MED ORDER — METHYLPREDNISOLONE ACETATE 40 MG/ML INJ SUSP (RADIOLOG
120.0000 mg | Freq: Once | INTRAMUSCULAR | Status: AC
  Administered 2013-09-09: 120 mg via EPIDURAL

## 2013-11-17 HISTORY — PX: LUMBAR DISC SURGERY: SHX700

## 2014-03-05 ENCOUNTER — Encounter: Payer: Self-pay | Admitting: Family Medicine

## 2014-03-05 ENCOUNTER — Ambulatory Visit (INDEPENDENT_AMBULATORY_CARE_PROVIDER_SITE_OTHER): Payer: 59 | Admitting: Family Medicine

## 2014-03-05 VITALS — BP 110/76 | HR 76 | Temp 97.8°F | Ht 72.25 in | Wt 216.0 lb

## 2014-03-05 DIAGNOSIS — Z79899 Other long term (current) drug therapy: Secondary | ICD-10-CM

## 2014-03-05 DIAGNOSIS — H60399 Other infective otitis externa, unspecified ear: Secondary | ICD-10-CM

## 2014-03-05 DIAGNOSIS — H6092 Unspecified otitis externa, left ear: Secondary | ICD-10-CM

## 2014-03-05 DIAGNOSIS — G4700 Insomnia, unspecified: Secondary | ICD-10-CM

## 2014-03-05 DIAGNOSIS — E7800 Pure hypercholesterolemia, unspecified: Secondary | ICD-10-CM

## 2014-03-05 MED ORDER — NEOMYCIN-POLYMYXIN-HC 3.5-10000-1 OT SOLN: 4.0000 [drp] | Freq: Four times a day (QID) | OTIC | Status: DC

## 2014-03-05 MED ORDER — ZOLPIDEM TARTRATE 10 MG PO TABS: ORAL_TABLET | ORAL | Status: DC

## 2014-03-05 NOTE — Patient Instructions (Signed)
Otitis Externa Otitis externa is a bacterial or fungal infection of the outer ear canal. This is the area from the eardrum to the outside of the ear. Otitis externa is sometimes called "swimmer's ear." CAUSES  Possible causes of infection include:  Swimming in dirty water.  Moisture remaining in the ear after swimming or bathing.  Mild injury (trauma) to the ear.  Objects stuck in the ear (foreign body).  Cuts or scrapes (abrasions) on the outside of the ear. SYMPTOMS  The first symptom of infection is often itching in the ear canal. Later signs and symptoms may include swelling and redness of the ear canal, ear pain, and yellowish-white fluid (pus) coming from the ear. The ear pain may be worse when pulling on the earlobe. DIAGNOSIS  Your caregiver will perform a physical exam. A sample of fluid may be taken from the ear and examined for bacteria or fungi. TREATMENT  Antibiotic ear drops are often given for 10 to 14 days. Treatment may also include pain medicine or corticosteroids to reduce itching and swelling. PREVENTION   Keep your ear dry. Use the corner of a towel to absorb water out of the ear canal after swimming or bathing.  Avoid scratching or putting objects inside your ear. This can damage the ear canal or remove the protective wax that lines the canal. This makes it easier for bacteria and fungi to grow.  Avoid swimming in lakes, polluted water, or poorly chlorinated pools.  You may use ear drops made of rubbing alcohol and vinegar after swimming. Combine equal parts of white vinegar and alcohol in a bottle. Put 3 or 4 drops into each ear after swimming. HOME CARE INSTRUCTIONS   Apply antibiotic ear drops to the ear canal as prescribed by your caregiver.  Only take over-the-counter or prescription medicines for pain, discomfort, or fever as directed by your caregiver.  If you have diabetes, follow any additional treatment instructions from your caregiver.  Keep all  follow-up appointments as directed by your caregiver. SEEK MEDICAL CARE IF:   You have a fever.  Your ear is still red, swollen, painful, or draining pus after 3 days.  Your redness, swelling, or pain gets worse.  You have a severe headache.  You have redness, swelling, pain, or tenderness in the area behind your ear. MAKE SURE YOU:   Understand these instructions.  Will watch your condition.  Will get help right away if you are not doing well or get worse. Document Released: 09/05/2005 Document Revised: 11/28/2011 Document Reviewed: 09/22/2011 [facility] Patient Information 2015 [place], [place]. This information is not intended to replace advice given to you by your health care provider. Make sure you discuss any questions you have with your health care provider.

## 2014-03-05 NOTE — Progress Notes (Signed)
Chief Complaint  Patient presents with  . Otalgia    left ear pain x 4-5 day, last 12-24 hours have been worse.    4-5 days ago he started with left ear discomfort.  It was worse yesterday, and very painful last night, awoke him from sleep.  He has some decreased/muffled hearing.  Jaw movements also increase pain.  Denies runny nose, sinus pain, sore throat, PND, cough (just mild seasonal throat/allergy symptoms).  He took tylenol and aleve today.  Denies fevers, chills. No sick contacts.  +swimming.  Asking for ambien refill--Last filled 08/2013.  He reports that a prescription of #30 usually lasts 6 months.  While dealing with his back pain/surgery he admits to needing to use it more frequently than usual.  Requesting refill today.  Hyperlipidemia--he reports having a month left on current prescription, but admitting to being past due for labs.  He reports that he didn't take it regularly while having back issues.  Only back to taking it regularly for the last week.  Last labs 10/2012  Past Medical History  Diagnosis Date  . 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 Dyslipidemia   . Low back pain   . Colorblind    Past Surgical History  Procedure Laterality Date  . Vasectomy  11/09  . Cervical disc surgery  2009    JONES, MD; fusion  . Lumbar disc surgery  2000    CARTER, MD; L4-L5 (right)  . Lumbar disc surgery  11/2013    L4-5, Dr. Ronnald Ramp (left)   History   Social History  . Marital Status: Married    Spouse Name: N/A    Number of Children: 2  . Years of Education: N/A   Occupational History  . 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 Smoking status: Never Smoker   . Smokeless tobacco: Never Used  . Alcohol Use: Yes     Comment: 1 drink 3-4 times per week.  . Drug Use: No  . Sexual Activity: Yes    Partners: Female    Birth Control/ Protection: Surgical     Comment: vasectomy   Other Topics Concern  . Not on file   Social History Narrative   Lives with  wife, daughter, son and dog   Outpatient Encounter Prescriptions as of 03/05/2014  Medication Sig Note  . atorvastatin (LIPITOR) 20 MG tablet Take 1 tablet (20 mg total) by mouth daily. 03/05/2014: Only restarted last week; not taking regularly while dealing with back pain/surgery  . zolpidem (AMBIEN) 10 MG tablet TAKE 1 TABLET BY MOUTH AT BEDTIME AS NEEDED 03/05/2014: Uses sporadically, usually #30 lasts 6 months.  Needed more frequently related to back pain  . [DISCONTINUED] diphenhydrAMINE (BENADRYL) 25 MG tablet 1-2 tablets at bedtime when needed   . [DISCONTINUED] zolpidem (AMBIEN) 10 MG tablet TAKE 1 TABLET BY MOUTH AT BEDTIME AS NEEDED 03/05/2014: (30 pills usually last 6 months, needed more related to back pain/surgery)  . neomycin-polymyxin-hydrocortisone (CORTISPORIN) otic solution Place 4 drops into the left ear 4 (four) times daily.   . [DISCONTINUED] loratadine (CLARITIN) 10 MG tablet 1 tablet once a day for 10 days when needed   . [DISCONTINUED] methylPREDNISolone (MEDROL, PAK,) 4 MG tablet follow package directions   . [DISCONTINUED] predniSONE (DELTASONE) 10 MG tablet Take 2 tablets (20 mg total) by mouth daily.    No Known Allergies  ROS:  Denies headaches, dizziness, chest pain, shortness of breath.  +ear pain as per HPI.  Denies other URI symptoms, cough, shortness of breath.  No GI complaints.  No bleeding/bruising, rash.  Back pain improved s/p surgery, not yet back to playing tennis yet.  PHYSICAL EXAM: BP 110/76  Pulse 76  Temp(Src) 97.8 F (36.6 C) (Oral)  Ht 6' 0.25" (1.835 m)  Wt 216 lb (97.977 kg)  BMI 29.10 kg/m2 Well developed, pleasant male in no distress HEENT:  PERRL ,EOMI, conjunctiva clear.  R TM and EAC normal. Tender at L TMJ, no clicking. L EAC is swollen with exudate.  Visualized portion of TM is normal, without erythema or bulging.  OP clear. Sinuses nontender. Nasal mucosa normal. Neck: no lymphadenopathy or mass Heart: regular rate and rhythm without  murmur Lungs: clear bilaterally Neuro: alert and oriented.  Cranial nerves intact. Normal gait Psych: normal mood, affect, hygiene and grooming.  ASSESSMENT/PLAN:  Otitis externa of left ear - Plan: neomycin-polymyxin-hydrocortisone (CORTISPORIN) otic solution  Insomnia - Plan: zolpidem (AMBIEN) 10 MG tablet  Pure hypercholesterolemia - Plan: Lipid panel  Encounter for long-term (current) use of other medications - Plan: Comprehensive metabolic panel, Lipid panel  L OE, cannot r/o TMJ issues contributing Continue Aleve as needed for pain Cortisporin otic to treat OE.  Insomnia--sporadic.  Refilled ambien  Hyperlipidemia--noncompliant; past due for labs, but only back on meds x 1 week.  Set up labs for 2 months, and schedule CPE  c-met, lipids

## 2014-03-07 ENCOUNTER — Telehealth: Payer: Self-pay

## 2014-03-07 DIAGNOSIS — E7800 Pure hypercholesterolemia, unspecified: Secondary | ICD-10-CM

## 2014-03-07 NOTE — Telephone Encounter (Signed)
Refill request for Atorvastatin 20mg  #90 [facility]

## 2014-03-10 ENCOUNTER — Ambulatory Visit (INDEPENDENT_AMBULATORY_CARE_PROVIDER_SITE_OTHER): Payer: 59 | Admitting: Medical

## 2014-03-10 ENCOUNTER — Encounter: Payer: Self-pay | Admitting: Medical

## 2014-03-10 VITALS — BP 110/80 | HR 60 | Temp 97.6°F | Resp 14 | Wt 212.0 lb

## 2014-03-10 DIAGNOSIS — H60392 Other infective otitis externa, left ear: Secondary | ICD-10-CM

## 2014-03-10 DIAGNOSIS — H60399 Other infective otitis externa, unspecified ear: Secondary | ICD-10-CM

## 2014-03-10 MED ORDER — ATORVASTATIN CALCIUM 20 MG PO TABS: 20.0000 mg | ORAL_TABLET | Freq: Every day | ORAL | Status: DC

## 2014-03-10 NOTE — Progress Notes (Addendum)
Subjective: Patient is a 43 y.o. male presenting for 10 day history of left ear fullness/pressure. He was last seen here by Dr. Tomi Bamberger on 03/05/2014 and diagnosed with Otitis Externa, prescribed Cortisporin and Aleve for pain. He admits compliance with medication and reports that his pain has improved but the fullness/pressure and decreased hearing persist. He now reports a headache and feels sharp pain at times like his ear "wants to pop". A few times, the pain has been worse when he applies the otic solution. He denies ringing in his ears but says that he sometimes feels he hears "crackles and pops". No fevers, sinus pressure, runny nose, jaw pain, throat pain, dizziness, vertigo, vision changes. He reports having seasonal allergies but hasn't had any difficulties with his sinuses recently. He notes that he has had more difficulty with his left ear recently and has needed to have it irrigated for cerumen. He did not have it irrigated last week when he was seen by Dr. Tomi Bamberger. No other aggravating or relieving factors.  The patient has no other questions or concerns.  ROS as in subjective.   Objective:  BP 110/80  Pulse 60  Temp(Src) 97.6 F (36.4 C) (Oral)  Resp 14  Wt 212 lb (96.163 kg)  General appearance: alert, no distress  HEENT: normocephalic, sclerae anicteric, Right TM partially occluded by cerumen otherwise intact, ear canal without erythema or discharge; Left TM is occluded by clumpy whitish discharge mixed with cerumen, ear canal has no discharge and is without erythema. Nares patent, no discharge or erythema, pharynx normal Oral cavity: MMM, no lesions Neck: supple, no lymphadenopathy, no thyromegaly, no masses   Assessment:  Encounter Diagnosis  Name Primary?  . Otitis, externa, infective, left Yes     Plan: Used currette to remove some of the left canal debris from left ear canal.  Inserted ear wick.   No apparent erythema or swollen canal.    Advised he give the  antibiotic drops more time, c/t Ibuprofen, avoid getting water in the ear.  If not much improved in 72 hours, consider ENT referral for better debridement.  Patient was seen in conjunction with PA student Jaynee Eagles, and I have also evaluated and examined patient, agree with student's notes, student supervised by me.

## 2014-03-10 NOTE — Telephone Encounter (Signed)
Done

## 2014-03-12 ENCOUNTER — Telehealth: Payer: Self-pay | Admitting: *Deleted

## 2014-03-12 DIAGNOSIS — G4700 Insomnia, unspecified: Secondary | ICD-10-CM

## 2014-03-12 MED ORDER — ZOLPIDEM TARTRATE 10 MG PO TABS: ORAL_TABLET | ORAL | Status: DC

## 2014-03-12 NOTE — Telephone Encounter (Signed)
Ok #30 this time only

## 2014-03-12 NOTE — Telephone Encounter (Signed)
Spoke with patient and he states that he had the rx bottle in his car, went into a store and accidentally left his car unlocked and when he got back to the care the bottle was gone. He searched his entire care several times so this will not turn up.

## 2014-03-12 NOTE — Telephone Encounter (Signed)
Larry Cruz, pharmacist @ [facility]. Larry Cruz filled his ambien rx last week and has lost the prescription. Pharmacist is asking for permission to fill early (3 weeks). If that is okay then they would actually need new rx phoned in as original did not have any refills. Thanks.

## 2014-03-12 NOTE — Telephone Encounter (Signed)
Called into pharmacy

## 2014-03-12 NOTE — Telephone Encounter (Signed)
Please call pt and find out details--if it might turn up again, then just give #10 to last until he can find bottle (since he stated that #30 usually lasts 6 months).  Advise him of policy of NOT doing this--would make exception just ONCE if he has a believable story

## 2014-03-17 ENCOUNTER — Telehealth: Payer: Self-pay | Admitting: Family Medicine

## 2014-03-17 NOTE — Telephone Encounter (Signed)
Patient schedule with Dr.Chris Lucia Gaskins for 03/21/14 @ 2:15pm. Patient aware.

## 2014-03-17 NOTE — Telephone Encounter (Signed)
Refer to ENT, as per Shane's note from a week ago

## 2014-04-17 ENCOUNTER — Encounter: Payer: Self-pay | Admitting: Family Medicine

## 2014-04-17 ENCOUNTER — Ambulatory Visit (INDEPENDENT_AMBULATORY_CARE_PROVIDER_SITE_OTHER): Payer: 59 | Admitting: Family Medicine

## 2014-04-17 VITALS — BP 130/80 | HR 72 | Ht 73.0 in | Wt 212.0 lb

## 2014-04-17 DIAGNOSIS — E7800 Pure hypercholesterolemia, unspecified: Secondary | ICD-10-CM

## 2014-04-17 DIAGNOSIS — Z0000 Encounter for general adult medical examination without abnormal findings: Secondary | ICD-10-CM

## 2014-04-17 LAB — POCT URINALYSIS DIPSTICK
Bilirubin, UA: NEGATIVE
Blood, UA: NEGATIVE
Glucose, UA: NEGATIVE
KETONES UA: NEGATIVE
Leukocytes, UA: NEGATIVE
Nitrite, UA: NEGATIVE
PROTEIN UA: NEGATIVE
SPEC GRAV UA: 1.01
Urobilinogen, UA: NEGATIVE
pH, UA: 6

## 2014-04-17 NOTE — Patient Instructions (Signed)
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
## 2014-05-05 ENCOUNTER — Other Ambulatory Visit: Payer: 59

## 2014-05-05 DIAGNOSIS — Z79899 Other long term (current) drug therapy: Secondary | ICD-10-CM

## 2014-05-05 DIAGNOSIS — E7800 Pure hypercholesterolemia, unspecified: Secondary | ICD-10-CM

## 2014-05-06 LAB — LIPID PANEL
CHOLESTEROL: 214 mg/dL — AB (ref 0–200)
HDL: 63 mg/dL (ref >39)
LDL Cholesterol: 138 mg/dL — ABNORMAL HIGH (ref 0–99)
TRIGLYCERIDES: 66 mg/dL (ref <150)
Total CHOL/HDL Ratio: 3.4 Ratio
VLDL: 13 mg/dL (ref 0–40)

## 2014-05-06 LAB — COMPREHENSIVE METABOLIC PANEL
ALBUMIN: 4.7 g/dL (ref 3.5–5.2)
ALT: 24 U/L (ref 0–53)
AST: 31 U/L (ref 0–37)
Alkaline Phosphatase: 78 U/L (ref 39–117)
BILIRUBIN TOTAL: 1 mg/dL (ref 0.2–1.2)
BUN: 14 mg/dL (ref 6–23)
CO2: 27 meq/L (ref 19–32)
Calcium: 9 mg/dL (ref 8.4–10.5)
Chloride: 101 mEq/L (ref 96–112)
Creat: 1.01 mg/dL (ref 0.50–1.35)
GLUCOSE: 93 mg/dL (ref 70–99)
Potassium: 4.3 mEq/L (ref 3.5–5.3)
SODIUM: 137 meq/L (ref 135–145)
Total Protein: 6.9 g/dL (ref 6.0–8.3)

## 2014-08-04 ENCOUNTER — Encounter: Payer: Self-pay | Admitting: Family Medicine

## 2014-08-04 ENCOUNTER — Ambulatory Visit (INDEPENDENT_AMBULATORY_CARE_PROVIDER_SITE_OTHER): Payer: 59 | Admitting: Family Medicine

## 2014-08-04 VITALS — BP 120/84 | HR 56 | Temp 97.7°F | Ht 73.0 in | Wt 205.0 lb

## 2014-08-04 DIAGNOSIS — J309 Allergic rhinitis, unspecified: Secondary | ICD-10-CM

## 2014-08-04 DIAGNOSIS — R059 Cough, unspecified: Secondary | ICD-10-CM

## 2014-08-04 DIAGNOSIS — R05 Cough: Secondary | ICD-10-CM

## 2014-08-04 MED ORDER — ALBUTEROL SULFATE HFA 108 (90 BASE) MCG/ACT IN AERS: 2.0000 | INHALATION_SPRAY | Freq: Four times a day (QID) | RESPIRATORY_TRACT | Status: DC | PRN

## 2014-08-04 MED ORDER — BENZONATATE 200 MG PO CAPS: 200.0000 mg | ORAL_CAPSULE | Freq: Three times a day (TID) | ORAL | Status: DC | PRN

## 2014-08-04 NOTE — Patient Instructions (Signed)
  Drink plenty of fluids. Continue to use allergy medications regularly (zyrtec or Zyrtec-D). Restart Flonase, using 2 sprays (gentle sniffs) into each nostril daily. Continue guaifenesin (either plain Mucinex or the DM) Use the albuterol inhaler as needed for dry cough, chest tightness and shortness of breath. Use Tessalon (benzonatate) as needed for cough--this can be used during the day, and can be used along with Mucinex.  If your cough isn't improving over the next week, then let's get a chest x-ray (call us to have order entered) and then a course of steroids if no pneumonia seen

## 2014-08-04 NOTE — Progress Notes (Signed)
Chief Complaint  Patient presents with  . Cough    x 3 weeks. Felt like it was allergy related at first. Has moved down to his chest. When he lays on his left side feels like he is wheezing. Took zyrtec and zyrtec D without any relief. Mucinex DM up until last evening-not much help. Persisitant cough.Did cough up some white mucus.   He has been coughing on/off for about 3 weeks.  Feels like there is something in his chest.  He feels a tickle in his throat, postnasal drainage, but the Zyrtec hasn't been helping like usual. Cough is worse with exercise and lying down.  Pudding consistency to the phlegm that he sometimes gets up. Doesn't cough up mucus every day, just every couple of days.  Phlegm is white, and usually early in the day (before lunch).  No known fevers, chills. 2 days ago he had a headache from coughing so much.  Denies sinus headaches.  He has taken Zyrtec, Zyrtec-D and Mucinex-DM.  There has been no improvement in cough.  No sick contacts at home.  +friends with pneumonia, cough.  He tried his son's inhaler last week (without the aerochamber)--?if it helped.  PMH, PSH, SH reviewed  Outpatient Encounter Prescriptions as of 08/04/2014  Medication Sig Note  . atorvastatin (LIPITOR) 20 MG tablet Take 1 tablet (20 mg total) by mouth daily.   . Pseudoephedrine-APAP-DM (DAYQUIL PO) Take 30 mLs by mouth once.   Marland Kitchen zolpidem (AMBIEN) 10 MG tablet TAKE 1 TABLET BY MOUTH AT BEDTIME AS NEEDED 04/17/2014: Takes sporadically, prn  . albuterol (PROVENTIL HFA;VENTOLIN HFA) 108 (90 BASE) MCG/ACT inhaler Inhale 2 puffs into the lungs every 6 (six) hours as needed for wheezing or shortness of breath.   . benzonatate (TESSALON) 200 MG capsule Take 1 capsule (200 mg total) by mouth 3 (three) times daily as needed.   Marland Kitchen dextromethorphan-guaiFENesin (MUCINEX DM) 30-600 MG per 12 hr tablet Take 1 tablet by mouth 2 (two) times daily. 08/04/2014: Taking for a week (not yet today)  . naproxen sodium (ANAPROX)  220 MG tablet Take 440 mg by mouth 2 (two) times daily with a meal. 04/17/2014: Taking about 4-5 times per week.    (tessalon and albuterol rx'd today, not taking prior to visit)  No Known Allergies   ROS: No nausea, vomiting, diarrhea, rashes, bleeding, bruising, rashes. No fevers, chills, headaches, dizziness, ear pain.  No numbness/tingling/weakness. No urinary complaints. No myalgias/arthralgias.  See HPI  PHYSICAL EXAM: BP 120/84 mmHg  Pulse 56  Temp(Src) 97.7 F (36.5 C) (Tympanic)  Ht 6\' 1"  (1.854 m)  Wt 205 lb (92.987 kg)  BMI 27.05 kg/m2 Pleasant male in no distress.  Frequent throat-clearing and dry cough.  Speaking easily in full sentences.  Some increased cough with deep breaths HEENT: PERRL, EOMI, conjunctiva clear. TM's and EAC's normal. Nasal mucosa is mod on the right, mod-severe on the left.  Mucosa is not erythematous.  Some white mucus noted on the left.  Sinuses nontender Neck: no lymphadenopathy or mass Heart: regular rate and rhythm, no murmur Lungs: clear bilaterally.  Slightly coarse anteriorly with forced expiration, but not wheezing noted.  Had some cough with forced expiration.  ASSESSMENT/PLAN:  Cough - suspect contribution from PND and RAD based on history - Plan: benzonatate (TESSALON) 200 MG capsule, albuterol (PROVENTIL HFA;VENTOLIN HFA) 108 (90 BASE) MCG/ACT inhaler  Allergic rhinitis, unspecified allergic rhinitis type  Cough--likely has a component of post nasal drainage from inadequately controlled allergies.  May  also have reactive airway component.  No evidence of infection currently.   Drink plenty of fluids. Continue to use allergy medications regularly (zyrtec or Zyrtec-D). Restart Flonase, using 2 sprays (gentle sniffs) into each nostril daily. Continue guaifenesin (either plain Mucinex or the DM) Use the albuterol inhaler as needed for dry cough, chest tightness and shortness of breath. Use Tessalon (benzonatate) as needed for cough--this  can be used during the day, and can be used along with Mucinex.  If your cough isn't improving over the next week, then let's get a chest x-ray (call us to have order entered) and then a course of steroids if no pneumonia seen

## 2014-11-12 ENCOUNTER — Other Ambulatory Visit: Payer: Self-pay | Admitting: Family Medicine

## 2014-11-13 NOTE — Telephone Encounter (Signed)
Yes, okay to refill.  But please have him schedule for CPE in July

## 2014-11-13 NOTE — Telephone Encounter (Signed)
Are these okay to fill?

## 2015-03-19 ENCOUNTER — Telehealth: Payer: Self-pay | Admitting: Internal Medicine

## 2015-03-19 DIAGNOSIS — A6000 Herpesviral infection of urogenital system, unspecified: Secondary | ICD-10-CM

## 2015-03-19 MED ORDER — VALACYCLOVIR HCL 500 MG PO TABS: 500.0000 mg | ORAL_TABLET | Freq: Two times a day (BID) | ORAL | Status: DC

## 2015-03-19 NOTE — Telephone Encounter (Signed)
Refill request for valtrex 500mg  #30 to [facility]

## 2015-03-19 NOTE — Telephone Encounter (Signed)
Left message on vm that med was sent into pharmacy

## 2015-03-19 NOTE — Telephone Encounter (Signed)
I don't see where this has ever been prescribed for him.  I do see a history of genital HSV in chart.  Advise pt that rx was sent, assuming to be used for prn flare (rather than preventative).  If you speak to him, please also offer him my congratulations on his promotion.

## 2015-04-30 ENCOUNTER — Ambulatory Visit (INDEPENDENT_AMBULATORY_CARE_PROVIDER_SITE_OTHER): Payer: 59 | Admitting: Family Medicine

## 2015-04-30 ENCOUNTER — Encounter: Payer: Self-pay | Admitting: Family Medicine

## 2015-04-30 VITALS — BP 130/72 | HR 60 | Ht 73.0 in | Wt 215.0 lb

## 2015-04-30 DIAGNOSIS — Z5181 Encounter for therapeutic drug level monitoring: Secondary | ICD-10-CM

## 2015-04-30 DIAGNOSIS — E7800 Pure hypercholesterolemia, unspecified: Secondary | ICD-10-CM

## 2015-04-30 DIAGNOSIS — Z0000 Encounter for general adult medical examination without abnormal findings: Secondary | ICD-10-CM

## 2015-04-30 LAB — CBC WITH DIFFERENTIAL/PLATELET
BASOS ABS: 0 10*3/uL (ref 0.0–0.1)
BASOS PCT: 0 % (ref 0–1)
EOS ABS: 0.2 10*3/uL (ref 0.0–0.7)
Eosinophils Relative: 4 % (ref 0–5)
HCT: 41.2 % (ref 39.0–52.0)
Hemoglobin: 14.4 g/dL (ref 13.0–17.0)
Lymphocytes Relative: 32 % (ref 12–46)
Lymphs Abs: 1.9 10*3/uL (ref 0.7–4.0)
MCH: 32.1 pg (ref 26.0–34.0)
MCHC: 35 g/dL (ref 30.0–36.0)
MCV: 91.8 fL (ref 78.0–100.0)
MPV: 11.4 fL (ref 8.6–12.4)
Monocytes Absolute: 0.4 10*3/uL (ref 0.1–1.0)
Monocytes Relative: 7 % (ref 3–12)
Neutro Abs: 3.4 10*3/uL (ref 1.7–7.7)
Neutrophils Relative %: 57 % (ref 43–77)
Platelets: 207 10*3/uL (ref 150–400)
RBC: 4.49 MIL/uL (ref 4.22–5.81)
RDW: 14.6 % (ref 11.5–15.5)
WBC: 5.9 10*3/uL (ref 4.0–10.5)

## 2015-04-30 LAB — LIPID PANEL
CHOL/HDL RATIO: 3.2 ratio (ref <=5.0)
CHOLESTEROL: 208 mg/dL — AB (ref 125–200)
HDL: 66 mg/dL (ref >=40)
LDL Cholesterol: 120 mg/dL (ref <130)
TRIGLYCERIDES: 111 mg/dL (ref <150)
VLDL: 22 mg/dL (ref <30)

## 2015-04-30 LAB — POCT URINALYSIS DIPSTICK
BILIRUBIN UA: NEGATIVE
Glucose, UA: NEGATIVE
Ketones, UA: NEGATIVE
Leukocytes, UA: NEGATIVE
NITRITE UA: NEGATIVE
Protein, UA: NEGATIVE
RBC UA: NEGATIVE
Spec Grav, UA: 1.02
UROBILINOGEN UA: NEGATIVE
pH, UA: 6

## 2015-04-30 LAB — COMPREHENSIVE METABOLIC PANEL
ALBUMIN: 4.7 g/dL (ref 3.6–5.1)
ALT: 21 U/L (ref 9–46)
AST: 19 U/L (ref 10–40)
Alkaline Phosphatase: 64 U/L (ref 40–115)
BUN: 10 mg/dL (ref 7–25)
CALCIUM: 9.3 mg/dL (ref 8.6–10.3)
CHLORIDE: 104 mmol/L (ref 98–110)
CO2: 28 mmol/L (ref 20–31)
Creat: 1.05 mg/dL (ref 0.60–1.35)
Glucose, Bld: 86 mg/dL (ref 65–99)
POTASSIUM: 4.4 mmol/L (ref 3.5–5.3)
Sodium: 141 mmol/L (ref 135–146)
TOTAL PROTEIN: 7.2 g/dL (ref 6.1–8.1)
Total Bilirubin: 0.7 mg/dL (ref 0.2–1.2)

## 2015-04-30 MED ORDER — ATORVASTATIN CALCIUM 20 MG PO TABS: 20.0000 mg | ORAL_TABLET | Freq: Every day | ORAL | Status: DC

## 2015-04-30 NOTE — Progress Notes (Signed)
Chief Complaint  Patient presents with  . Annual Exam    fasting annual exam, no concerns.    Larry Cruz is a 44 y.o. male who presents for a complete physical.  He has the following concerns:  Hyperlipidemia follow-up: Patient is reportedly following a low-fat, low cholesterol diet. Compliant with medications and denies medication side effects.   Insomnia--intermittent. He needed Ambien more related to stressful interview process (for the promotion he got).  Some weeks he may need Ambien 3-4 times, but goes many weeks without needing it at all.   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  Got Valtrex refilled in June, and used a few pills.  Prior to this, hadn't had a flare in years.  Immunization History  Administered Date(s) Administered  . Influenza Split 07/03/2012, 07/03/2014  . Tdap 10/22/2012   gets flu shots yearly at work [employer]) Last colonoscopy: never  Last PSA: never  Dentist: twice yearly  Ophtho:5-6 years ago, plans to schedule  Exercise:Plays tennis 2-4x/week, singles and doubles. 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  Weight lifting twice a month ([employer] class)  Past Medical History  Diagnosis Date  . 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 Dyslipidemia   . Low back pain   . Colorblind     Past Surgical History  Procedure Laterality Date  . Vasectomy  11/09  . Cervical disc surgery  2009    JONES, MD; fusion  . Lumbar disc surgery  2000    CARTER, MD; L4-L5 (right)  . Lumbar disc surgery  11/2013    L4-5, Dr. Ronnald Ramp (left)    Social History   Social History  . Marital Status: Married    Spouse Name: N/A  . Number of Children: 2  . Years of Education: N/A   Occupational History  . Development worker, [employer] [place] History Main Topics  . Smoking status: Never Smoker   . Smokeless tobacco: Never Used  . Alcohol Use: Yes     Comment: 1 drink 3-4 times per week.  . Drug Use: No  . Sexual  Activity:    Partners: Female    Patent examiner Protection: Surgical     Comment: vasectomy   Other Topics Concern  . Not on file   Social History Narrative   Lives with wife, daughter, son and dog    Family History  Problem Relation Age of Onset  . Heart disease Father     CABG in mid 4's  . Hypertension Father   . Alzheimer's disease Father   . Hyperlipidemia Father   . Diabetes Father   . Hyperlipidemia Mother   . Hyperlipidemia Sister   . Hyperlipidemia Brother   . Hypertension Brother   . Hyperlipidemia Brother   . Hyperlipidemia Sister   . Melanoma Sister   . Hyperlipidemia Sister   . Hyperlipidemia Sister   . Hyperlipidemia Sister   . Hyperlipidemia Sister   . Hyperlipidemia Sister   . Asthma Son     Outpatient Encounter Prescriptions as of 04/30/2015  Medication Sig Note  . atorvastatin (LIPITOR) 20 MG tablet TAKE 1 TABLET BY MOUTH ONCE DAILY   . zolpidem (AMBIEN) 10 MG tablet TAKE 1 TABLET BY MOUTH EVERY NIGHT AT BEDTIME AS NEEDED   . valACYclovir (VALTREX) 500 MG tablet Take 1 tablet (500 mg total) by mouth 2 (two) times daily. Use for twice daily for 3 days as needed for outbreaks (Patient not  taking: Reported on 04/30/2015)   . [DISCONTINUED] albuterol (PROVENTIL HFA;VENTOLIN HFA) 108 (90 BASE) MCG/ACT inhaler Inhale 2 puffs into the lungs every 6 (six) hours as needed for wheezing or shortness of breath.   . [DISCONTINUED] benzonatate (TESSALON) 200 MG capsule Take 1 capsule (200 mg total) by mouth 3 (three) times daily as needed.   . [DISCONTINUED] dextromethorphan-guaiFENesin (MUCINEX DM) 30-600 MG per 12 hr tablet Take 1 tablet by mouth 2 (two) times daily. 08/04/2014: Taking for a week (not yet today)  . [DISCONTINUED] naproxen sodium (ANAPROX) 220 MG tablet Take 440 mg by mouth 2 (two) times daily with a meal. 04/17/2014: Taking about 4-5 times per week.   . [DISCONTINUED] Pseudoephedrine-APAP-DM (DAYQUIL PO) Take 30 mLs by mouth once.    No  facility-administered encounter medications on file as of 04/30/2015.    No Known Allergies   ROS: The patient denies anorexia, fever, weight changes, headaches, vision loss, decreased hearing, ear pain, hoarseness, chest pain, dizziness, syncope, dyspnea on exertion, cough, swelling, nausea, vomiting, diarrhea, abdominal pain, melena, hematochezia, indigestion/heartburn, hematuria, incontinence, erectile dysfunction, nocturia, weakened urine stream, dysuria, genital lesions, numbness, tingling, weakness, tremor, suspicious skin lesions, depression, anxiety, abnormal bleeding/bruising, or enlarged lymph nodes.  Slight tightness in chest only when he is dehydrated and playing aggressive tennis (rare, relieved by drinking water). Herpes flares are infrequent (had one recently) Some pain in back, knees, ankles, hips. only rarely needs NSAID (naproxen) Some constipation, chronic, constipation.  Rarely has some BRB and discomfort with passing a stool (when constipated). Insomnia--per HPI.   PHYSICAL EXAM:  BP 130/72 mmHg  Pulse 60  Ht 6\' 1"  (1.854 m)  Wt 215 lb (97.523 kg)  BMI 28.37 kg/m2  General Appearance:  Alert, cooperative, no distress, appears stated age   Head:  Normocephalic, without obvious abnormality, atraumatic   Eyes:  PERRL, conjunctiva/corneas clear, EOM's intact, fundi  benign   Ears:  Normal TM's and external ear canals  Nose:  Nares normal, mucosa normal, pale, no drainage or sinus tenderness   Throat:  Lips, mucosa, and tongue normal; teeth and gums normal   Neck:  Supple, no lymphadenopathy; thyroid: no enlargement/tenderness/nodules; no carotid  bruit or JVD   Back:  Spine nontender, no curvature, ROM normal, no CVA tenderness   Lungs:  Clear to auscultation bilaterally without wheezes, rales or ronchi; respirations unlabored   Chest Wall:  No tenderness or deformity   Heart:  Regular rate and rhythm, S1 and S2 normal, no murmur, rub  or  gallop   Breast Exam:  No chest wall tenderness, masses or gynecomastia   Abdomen:  Soft, non-tender, nondistended, normoactive bowel sounds,  no masses, no hepatosplenomegaly.  Genitalia:  Normal male external genitalia without lesions. Testicles without masses. No inguinal hernias.   Rectal:  Normal sphincter tone, no masses or tenderness; guaiac negative stool. Prostate smooth, no nodules, not enlarged.   Extremities:  No clubbing, cyanosis or edema   Pulses:  2+ and symmetric all extremities   Skin:  Skin color, texture, turgor normal, no rashes or lesions   Lymph nodes:  Cervical, supraclavicular, and axillary nodes normal   Neurologic:  CNII-XII intact, normal strength, sensation and gait; reflexes 2+ and symmetric throughout    Psych: Normal mood, affect, hygiene and grooming        ASSESSMENT/PLAN:  Annual physical exam - Plan: POCT Urinalysis Dipstick, Visual acuity screening, Lipid panel, Comprehensive metabolic panel, CBC with Differential/Platelet  Pure hypercholesterolemia - Plan: Lipid panel, Comprehensive metabolic  panel, atorvastatin (LIPITOR) 20 MG tablet  Medication monitoring encounter - Plan: Lipid panel, Comprehensive metabolic panel, CBC with Differential/Platelet   Recommended at least 30 minutes of aerobic activity at least 5 days/week, weight bearing exercise 2x/wk; proper sunscreen use reviewed; healthy diet and alcohol recommendations (less than or equal to 2 drinks/day) reviewed; regular seatbelt use; changing batteries in smoke detectors. Self-testicular exams. Immunization recommendations discussed--continue yearly flu shots. Colonoscopy recommendations reviewed--age 44.   F/u 1 year, sooner prn

## 2015-04-30 NOTE — Patient Instructions (Signed)

## 2015-06-02 ENCOUNTER — Other Ambulatory Visit: Payer: Self-pay

## 2015-06-02 ENCOUNTER — Other Ambulatory Visit: Payer: Self-pay | Admitting: Family Medicine

## 2015-06-02 NOTE — Telephone Encounter (Signed)
Is this okay to refill? 

## 2015-06-02 NOTE — Telephone Encounter (Signed)
cheri called out med to pharmacy

## 2015-06-02 NOTE — Telephone Encounter (Signed)
Yes, okay for both, as previously filled

## 2015-11-19 MED FILL — ATORVASTATIN 20 MG TABLET: 20 | 90 days supply | Qty: 90 | Fill #1

## 2015-12-29 ENCOUNTER — Encounter: Payer: Self-pay | Admitting: Sports Medicine

## 2015-12-29 ENCOUNTER — Ambulatory Visit (INDEPENDENT_AMBULATORY_CARE_PROVIDER_SITE_OTHER): Payer: 59 | Admitting: Sports Medicine

## 2015-12-29 VITALS — BP 137/85 | HR 79 | Ht 73.0 in | Wt 210.0 lb

## 2015-12-29 DIAGNOSIS — M722 Plantar fascial fibromatosis: Secondary | ICD-10-CM | POA: Diagnosis not present

## 2015-12-29 DIAGNOSIS — M216X9 Other acquired deformities of unspecified foot: Secondary | ICD-10-CM

## 2015-12-29 DIAGNOSIS — M79672 Pain in left foot: Secondary | ICD-10-CM | POA: Diagnosis not present

## 2015-12-29 DIAGNOSIS — Q667 Congenital pes cavus, unspecified foot: Secondary | ICD-10-CM | POA: Insufficient documentation

## 2015-12-29 NOTE — Assessment & Plan Note (Signed)
PF stretch program  Progressive PF exercise rehab  Icing prn Spiky ball for massage

## 2015-12-29 NOTE — Progress Notes (Signed)
Patient ID: Larry Cruz, male   DOB: 04-24-1971, 45 y.o.   MRN: [id number]  Patient C/O heel pain worse on left  Patient has been active in sports for years.  Played FB, BB, rugby etc in HS/ college Now works out and plays tennis  Chronic heel pain for more than a year Hx of multiple ankle sprains left more than RT  Past few weeks heel pain has been progressive and not responding to any efforts on his part He is an Scientist, product/process development and has tried many of the stretches and PT approaches without relief  Soc HX; Heat of OT at [place] No hx of sciatica No Hx of foot numbness Denies ankle or foot swelling or swelling in other joints  PEXAM Muscular M in NAD BP 137/85 mmHg  Pulse 79  Ht 6\' 1"  (1.854 m)  Wt 210 lb (95.255 kg)  BMI 27.71 kg/m2  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

## 2015-12-29 NOTE — Assessment & Plan Note (Signed)
Needs custom orthtotic to support his cavus foot  Trial on sports insoles with scaphoid pad and some heel lift  If these help we will then proceed to custom orthotic in 1 to 2 mos

## 2016-01-09 ENCOUNTER — Telehealth: Payer: 59 | Admitting: Physician Assistant

## 2016-01-09 DIAGNOSIS — L237 Allergic contact dermatitis due to plants, except food: Secondary | ICD-10-CM

## 2016-01-09 MED ORDER — METHYLPREDNISOLONE 4 MG PO TBPK: ORAL_TABLET | ORAL | Status: DC

## 2016-01-09 NOTE — Progress Notes (Signed)

## 2016-02-24 ENCOUNTER — Other Ambulatory Visit: Payer: Self-pay | Admitting: Family Medicine

## 2016-02-24 MED FILL — ATORVASTATIN 20 MG TABLET: 20 | 90 days supply | Qty: 90 | Fill #2

## 2016-02-24 NOTE — Telephone Encounter (Signed)
Is this okay to refill? 

## 2016-02-24 NOTE — Telephone Encounter (Signed)
Ok to refill #30, no refill

## 2016-02-25 MED FILL — ZOLPIDEM TARTRATE 10 MG TAB: 10 | 30 days supply | Qty: 30 | Fill #0

## 2016-03-01 DIAGNOSIS — L814 Other melanin hyperpigmentation: Secondary | ICD-10-CM | POA: Diagnosis not present

## 2016-03-01 DIAGNOSIS — L570 Actinic keratosis: Secondary | ICD-10-CM | POA: Diagnosis not present

## 2016-03-01 DIAGNOSIS — L918 Other hypertrophic disorders of the skin: Secondary | ICD-10-CM | POA: Diagnosis not present

## 2016-03-01 DIAGNOSIS — D1801 Hemangioma of skin and subcutaneous tissue: Secondary | ICD-10-CM | POA: Diagnosis not present

## 2016-04-04 ENCOUNTER — Ambulatory Visit (INDEPENDENT_AMBULATORY_CARE_PROVIDER_SITE_OTHER): Payer: 59 | Admitting: Sports Medicine

## 2016-04-04 ENCOUNTER — Encounter: Payer: Self-pay | Admitting: Sports Medicine

## 2016-04-04 VITALS — BP 133/88 | HR 68 | Ht 72.0 in | Wt 214.0 lb

## 2016-04-04 DIAGNOSIS — M216X9 Other acquired deformities of unspecified foot: Secondary | ICD-10-CM | POA: Diagnosis not present

## 2016-04-04 DIAGNOSIS — S92911A Unspecified fracture of right toe(s), initial encounter for closed fracture: Secondary | ICD-10-CM | POA: Diagnosis not present

## 2016-04-04 NOTE — Progress Notes (Signed)
   Subjective:    Patient ID: Larry Cruz, male    DOB: September 20, 1970, 45 y.o.   MRN: [id number]  HPI   Patient comes in today for custom orthotics. Please see the office note from Dr. Oneida Alar dated 12/29/2015 for details regarding history and physical exam findings. Since that office visit, he has injured his right second toe. He initially stubbed it on the floor 3 weeks ago. This was followed by a reinjury while at the beach 2 weeks ago. He's had pain, swelling, and discoloration that he localizes to the second toe. He has been able to walk but is walking with a slight limp. Denies pain more proximally in his foot. No prior toe fracture but he does have a history of a prior foot fracture which was treated conservatively without surgery.    Review of Systems     Objective:   Physical Exam  Well-developed, well-nourished. No acute distress  Cavus foot  Right foot shows moderate swelling diffusely around the second toe. Limited range of motion secondary to pain. He is tender to palpation along the middle phalanx. No angulation or malrotation appreciated. Brisk capillary refill. Walks with a slight limp.  MSK ultrasound of the right second toe shows cortical irregularity and fluid along the distal middle phalanx. There is also some slight early soft callus formation best seen on the transverse view. Findings consistent with a middle phalanx fracture of his right second toe.       Assessment & Plan:  Cavus foot Right second toe fracture  Custom orthotics were constructed today. He found them to be comfortable prior to leaving the office. For his toe fracture we will treat him with buddy tape and a postop shoe. He may wean from both of these as his symptoms allow. Increase activity as tolerated. He is reassured that his fracture appears to be stable. He also understands that it may be a few weeks before he is able to return to all activity. Follow-up as needed.  Total time spent with the  patient was 30 minutes with greater than 50% of the time spent in face-to-face consultation discussing orthotic construction, instruction, and fitting. Time was also spent ultrasounding his right second toe and discussing treatment for his toe fracture.  Patient was fitted for a : standard, cushioned, semi-rigid orthotic. The orthotic was heated and afterward the patient stood on the orthotic blank positioned on the orthotic stand. The patient was positioned in subtalar neutral position and 10 degrees of ankle dorsiflexion in a weight bearing stance. After completion of molding, a stable base was applied to the orthotic blank. The blank was ground to a stable position for weight bearing. Size: 9 Base: blue EVA Posting: none Additional orthotic padding: none

## 2016-04-08 ENCOUNTER — Ambulatory Visit (INDEPENDENT_AMBULATORY_CARE_PROVIDER_SITE_OTHER): Payer: 59 | Admitting: Family Medicine

## 2016-04-08 ENCOUNTER — Encounter: Payer: Self-pay | Admitting: Family Medicine

## 2016-04-08 VITALS — BP 128/80 | HR 61 | Temp 97.9°F | Wt 218.0 lb

## 2016-04-08 DIAGNOSIS — H6092 Unspecified otitis externa, left ear: Secondary | ICD-10-CM | POA: Diagnosis not present

## 2016-04-08 MED ORDER — NEOMYCIN-POLYMYXIN-HC 3.5-10000-1 OT SUSP: 3.0000 [drp] | Freq: Three times a day (TID) | OTIC | Status: DC

## 2016-04-08 MED FILL — NEO/POLYMYXIN/HC EAR SUSP: 3.5-10000-1 | 7 days supply | Qty: 10 | Fill #0

## 2016-04-08 NOTE — Progress Notes (Signed)
   Subjective:    Patient ID: Larry Cruz, male    DOB: 03-29-1971, 45 y.o.   MRN: [id number]  HPI He is here for consult concerning a three-week history of fullness in the right ear and a two-week history of a fullness occurring in the left as well as increased pain. He's had no sore throat, cough, congestion. No history of recent swimming.   Review of Systems     Objective:   Physical Exam Alert  and in no distress. Both canals are narrow however the left canal was quite narrow and red. Slight tenderness to pulling on the ear was noted.       Assessment & Plan:  Left otitis externa - Plan: neomycin-polymyxin-hydrocortisone (CORTISPORIN) 3.5-10000-1 otic suspension He will call if further trouble. Also recommended setting up for complete exam with Dr. Tomi Bamberger sometime this fall.

## 2016-04-11 DIAGNOSIS — H6123 Impacted cerumen, bilateral: Secondary | ICD-10-CM | POA: Diagnosis not present

## 2016-04-11 DIAGNOSIS — H60333 Swimmer's ear, bilateral: Secondary | ICD-10-CM | POA: Diagnosis not present

## 2016-04-26 DIAGNOSIS — L570 Actinic keratosis: Secondary | ICD-10-CM | POA: Diagnosis not present

## 2016-04-26 DIAGNOSIS — L918 Other hypertrophic disorders of the skin: Secondary | ICD-10-CM | POA: Diagnosis not present

## 2016-05-03 NOTE — Progress Notes (Signed)
Chief Complaint  Patient presents with  . Annual Exam    fasting annual exam. No concerns.     Larry Cruz is a 45 y.o. male who presents for a complete physical.  He has the following concerns:  Seen 7/21 by Dr. Redmond School with ear pain, treated with Cortisporin for otitis externa. He ended up seeing Dr. Cresenciano Lick, and was treated there.  Symptoms resolved.  He had custom orthotics made last month by Dr. Micheline Chapman, and also was diagnosed with middle phalanx fracture of his right second toe (treated with buddy tape and post-op shoe). He just started back to tennis this week, minimal discomfort.  Some decreased ROM noted.  Hyperlipidemia follow-up: Patient is reportedly following a low-fat, low cholesterol diet. Compliant with medications and denies medication side effects.   Insomnia--intermittent. Some weeks he may need Ambien 3-4 times, but goes many weeks without needing it at all.  Last filled #30 on 02/24/16.  Doesn't need a refill.  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  Got Valtrex refilled in June, and used a few pills.  Prior to this, hadn't had a flare in years.  Immunization History  Administered Date(s) Administered  . Influenza Split 07/03/2012, 07/03/2014  . Tdap 10/22/2012   gets flu shots yearly at work [employer]) Last colonoscopy: never  Last PSA: never  Dentist: twice yearly  Ophtho:6-7 years ago, plans to schedule  Exercise:Plays tennis 2-4x/week, singles and doubles. Limited due to toe fracture and plantar fasciitis over the last few months. 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   Past Medical History:  Diagnosis Date  . Colorblind   . Dyslipidemia   . 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 Low back pain     Past Surgical History:  Procedure Laterality Date  . CERVICAL DISC SURGERY  2009   JONES, MD; fusion  . LUMBAR DISC SURGERY  2000   CARTER, MD; L4-L5 (right)  . LUMBAR DISC SURGERY  11/2013   L4-5, Dr. Ronnald Ramp (left)  .  VASECTOMY  11/09    Social History   Social History  . Marital status: Married    Spouse name: N/A  . Number of children: 2  . Years of education: N/A   Occupational History  . Development worker, [employer] History Main Topics  . Smoking status: Never Smoker  . Smokeless tobacco: Never Used  . Alcohol use Yes     Comment: 1 drink 3-4 times per week.  . Drug use: No  . Sexual activity: Yes    Partners: Female    Birth control/ protection: Surgical     Comment: vasectomy   Other Topics Concern  . Not on file   Social History Narrative   Lives with wife, daughter, son and dog    Family History  Problem Relation Age of Onset  . Heart disease Father     CABG in mid 27's  . Hypertension Father   . Alzheimer's disease Father   . Hyperlipidemia Father   . Diabetes Father   . Hyperlipidemia Mother   . Hyperlipidemia Sister   . Hyperlipidemia Brother   . Hypertension Brother   . Hyperlipidemia Brother   . Hyperlipidemia Sister   . Melanoma Sister   . Hyperlipidemia Sister   . Hyperlipidemia Sister   . Hyperlipidemia Sister   . Hyperlipidemia Sister   . Hyperlipidemia Sister   . Asthma Son     Outpatient Encounter Prescriptions as of  05/04/2016  Medication Sig  . atorvastatin (LIPITOR) 20 MG tablet Take 1 tablet (20 mg total) by mouth daily.  Marland Kitchen zolpidem (AMBIEN) 10 MG tablet TAKE 1 TABLET BY MOUTH ONCE DAILY AT BEDTIME AS NEEDED  . [DISCONTINUED] atorvastatin (LIPITOR) 20 MG tablet Take 1 tablet (20 mg total) by mouth daily.  . valACYclovir (VALTREX) 500 MG tablet TAKE 1 TABLET BY MOUTH 2 TIMES DAILY. USE FOR TWICE DAILY FOR 3 DAYS AS NEEDED FOR OUTBREAKS (Patient not taking: Reported on 05/04/2016)  . [DISCONTINUED] neomycin-polymyxin-hydrocortisone (CORTISPORIN) 3.5-10000-1 otic suspension Place 3 drops into both ears 3 (three) times daily.   No facility-administered encounter medications on file as of 05/04/2016.     No Known  Allergies   ROS: The patient denies anorexia, fever, weight changes, headaches, vision loss, decreased hearing, ear pain, hoarseness, chest pain, dizziness, syncope, dyspnea on exertion, cough, swelling, nausea, vomiting, diarrhea, abdominal pain, melena, hematochezia, indigestion/heartburn, hematuria, incontinence, erectile dysfunction, nocturia, weakened urine stream, dysuria, genital lesions, numbness, tingling, weakness, tremor, suspicious skin lesions, depression, anxiety, abnormal bleeding/bruising, or enlarged lymph nodes.  Herpes flares are infrequent Some pain in back, knees, ankles, hips. only rarely needs NSAID (naproxen), unchanged over the last few years.   Plantar fasciitis is improving, now has orthotics Some constipation, chronic, constipation.  Rarely has some BRB and discomfort with passing a stool (when constipated). Insomnia--per HPI. Goes to the dermatologist yearly, periodically gets AK's frozen from his scalp.    PHYSICAL EXAM:  BP 118/86   Pulse 64   Ht 6' 0.5" (1.842 m)   Wt 215 lb (97.5 kg)   BMI 28.76 kg/m   General Appearance:  Alert, cooperative, no distress, appears stated age   Head:  Normocephalic, without obvious abnormality, atraumatic   Eyes:  PERRL, conjunctiva/corneas clear, EOM's intact, fundi  benign   Ears:  Normal TM and EAC on the right.  Left canal is stained purple, and TM isn't well visualized  Nose:  Nares normal, mucosa normal, pale, no drainage or sinus tenderness   Throat:  Lips, mucosa, and tongue normal; teeth and gums normal   Neck:  Supple, no lymphadenopathy; thyroid: no enlargement/tenderness/nodules; no carotid  bruit or JVD   Back:  Spine nontender, no curvature, ROM normal, no CVA tenderness   Lungs:  Clear to auscultation bilaterally without wheezes, rales or ronchi; respirations unlabored   Chest Wall:  No tenderness or deformity   Heart:  Regular rate and rhythm, S1 and S2 normal, no murmur, rub   or gallop   Breast Exam:  No chest wall tenderness, masses or gynecomastia   Abdomen:  Soft, non-tender, nondistended, normoactive bowel sounds,  no masses, no hepatosplenomegaly.  Genitalia:  Normal male external genitalia without lesions. Testicles without masses. No inguinal hernias.   Rectal:  Normal sphincter tone, no masses or tenderness; guaiac negative stool. Prostate smooth, no nodules, not enlarged.   Extremities:  No clubbing, cyanosis or edema   Pulses:  2+ and symmetric all extremities   Skin:  Skin color, texture, turgor normal, no lesions. There is a patchy area of erythema in hair-bearing area of central lower chest (consistent with tinea versicolor--he reports this area does get itchy with perspiration)  Lymph nodes:  Cervical, supraclavicular, and axillary nodes normal   Neurologic:  CNII-XII intact, normal strength, sensation and gait; reflexes 2+ and symmetric throughout    Psych:  Normal mood, affect, hygiene and grooming    ASSESSMENT/PLAN:  Annual physical exam - Plan: Comprehensive metabolic panel, Lipid  panel, VITAMIN D 25 Hydroxy (Vit-D Deficiency, Fractures), Visual acuity screening, POCT Urinalysis Dipstick  Pure hypercholesterolemia - Plan: atorvastatin (LIPITOR) 20 MG tablet  Other fatigue - Plan: VITAMIN D 25 Hydroxy (Vit-D Deficiency, Fractures)  Medication monitoring encounter - Plan: Comprehensive metabolic panel, Lipid panel  Tinea versicolor - suspected on chest.  Discussed OTC antifungal use BID   c-met, lipids, vitamin D   Recommended at least 30 minutes of aerobic activity at least 5 days/week, weight bearing exercise 2x/wk; proper sunscreen use reviewed; healthy diet and alcohol recommendations (less than or equal to 2 drinks/day) reviewed; regular seatbelt use; changing batteries in smoke detectors. Self-testicular exams. Immunization recommendations discussed--continue yearly flu shots. Colonoscopy  recommendations reviewed--age 36.   F/u 1 year, sooner prn

## 2016-05-04 ENCOUNTER — Encounter: Payer: Self-pay | Admitting: Family Medicine

## 2016-05-04 ENCOUNTER — Ambulatory Visit (INDEPENDENT_AMBULATORY_CARE_PROVIDER_SITE_OTHER): Payer: 59 | Admitting: Family Medicine

## 2016-05-04 VITALS — BP 118/86 | HR 64 | Ht 72.5 in | Wt 215.0 lb

## 2016-05-04 DIAGNOSIS — Z5181 Encounter for therapeutic drug level monitoring: Secondary | ICD-10-CM

## 2016-05-04 DIAGNOSIS — R5383 Other fatigue: Secondary | ICD-10-CM | POA: Diagnosis not present

## 2016-05-04 DIAGNOSIS — Z0000 Encounter for general adult medical examination without abnormal findings: Secondary | ICD-10-CM | POA: Diagnosis not present

## 2016-05-04 DIAGNOSIS — E7800 Pure hypercholesterolemia, unspecified: Secondary | ICD-10-CM | POA: Diagnosis not present

## 2016-05-04 DIAGNOSIS — B360 Pityriasis versicolor: Secondary | ICD-10-CM

## 2016-05-04 LAB — POCT URINALYSIS DIPSTICK
BILIRUBIN UA: NEGATIVE
GLUCOSE UA: NEGATIVE
Ketones, UA: NEGATIVE
Leukocytes, UA: NEGATIVE
NITRITE UA: NEGATIVE
PH UA: 7
Protein, UA: NEGATIVE
RBC UA: NEGATIVE
SPEC GRAV UA: 1.02
UROBILINOGEN UA: NEGATIVE

## 2016-05-04 MED ORDER — ATORVASTATIN CALCIUM 20 MG PO TABS: 20.0000 mg | ORAL_TABLET | Freq: Every day | ORAL | 3 refills | Status: DC

## 2016-05-04 NOTE — Patient Instructions (Signed)

## 2016-05-05 LAB — LIPID PANEL
CHOL/HDL RATIO: 3.3 ratio (ref <=5.0)
Cholesterol: 196 mg/dL (ref 125–200)
HDL: 60 mg/dL (ref >=40)
LDL CALC: 108 mg/dL (ref <130)
Triglycerides: 138 mg/dL (ref <150)
VLDL: 28 mg/dL (ref <30)

## 2016-05-05 LAB — COMPREHENSIVE METABOLIC PANEL
ALT: 22 U/L (ref 9–46)
AST: 23 U/L (ref 10–40)
Albumin: 4.5 g/dL (ref 3.6–5.1)
Alkaline Phosphatase: 66 U/L (ref 40–115)
BUN: 10 mg/dL (ref 7–25)
CHLORIDE: 102 mmol/L (ref 98–110)
CO2: 24 mmol/L (ref 20–31)
CREATININE: 1.04 mg/dL (ref 0.60–1.35)
Calcium: 9.6 mg/dL (ref 8.6–10.3)
GLUCOSE: 99 mg/dL (ref 65–99)
POTASSIUM: 4.6 mmol/L (ref 3.5–5.3)
SODIUM: 138 mmol/L (ref 135–146)
Total Bilirubin: 0.9 mg/dL (ref 0.2–1.2)
Total Protein: 7.4 g/dL (ref 6.1–8.1)

## 2016-05-05 LAB — VITAMIN D 25 HYDROXY (VIT D DEFICIENCY, FRACTURES): VIT D 25 HYDROXY: 40 ng/mL (ref 30–100)

## 2016-06-13 ENCOUNTER — Encounter: Payer: 59 | Admitting: Family Medicine

## 2016-07-04 MED FILL — METHYLPREDNISOLONE 4 MG TAB: 4 | 6 days supply | Qty: 21 | Fill #0

## 2016-07-20 ENCOUNTER — Other Ambulatory Visit: Payer: Self-pay | Admitting: Family Medicine

## 2016-07-20 MED FILL — ZOLPIDEM TARTRATE 10 MG TAB: 10 | 30 days supply | Qty: 30 | Fill #0

## 2016-07-20 NOTE — Telephone Encounter (Signed)
Is this okay?

## 2016-07-20 NOTE — Telephone Encounter (Signed)
Last filled June, last seen August.  Okay to refill #30

## 2016-08-03 DIAGNOSIS — M62830 Muscle spasm of back: Secondary | ICD-10-CM | POA: Diagnosis not present

## 2016-08-03 DIAGNOSIS — M5136 Other intervertebral disc degeneration, lumbar region: Secondary | ICD-10-CM | POA: Diagnosis not present

## 2016-08-03 DIAGNOSIS — M5137 Other intervertebral disc degeneration, lumbosacral region: Secondary | ICD-10-CM | POA: Diagnosis not present

## 2016-08-03 DIAGNOSIS — M5441 Lumbago with sciatica, right side: Secondary | ICD-10-CM | POA: Diagnosis not present

## 2016-08-05 DIAGNOSIS — M5441 Lumbago with sciatica, right side: Secondary | ICD-10-CM | POA: Diagnosis not present

## 2016-08-05 DIAGNOSIS — M5137 Other intervertebral disc degeneration, lumbosacral region: Secondary | ICD-10-CM | POA: Diagnosis not present

## 2016-08-05 DIAGNOSIS — M62830 Muscle spasm of back: Secondary | ICD-10-CM | POA: Diagnosis not present

## 2016-08-05 DIAGNOSIS — M5136 Other intervertebral disc degeneration, lumbar region: Secondary | ICD-10-CM | POA: Diagnosis not present

## 2016-08-08 ENCOUNTER — Encounter: Payer: Self-pay | Admitting: *Deleted

## 2016-08-08 DIAGNOSIS — M5441 Lumbago with sciatica, right side: Secondary | ICD-10-CM | POA: Diagnosis not present

## 2016-08-08 DIAGNOSIS — M62830 Muscle spasm of back: Secondary | ICD-10-CM | POA: Diagnosis not present

## 2016-08-08 DIAGNOSIS — M5137 Other intervertebral disc degeneration, lumbosacral region: Secondary | ICD-10-CM | POA: Diagnosis not present

## 2016-08-08 DIAGNOSIS — M5136 Other intervertebral disc degeneration, lumbar region: Secondary | ICD-10-CM | POA: Diagnosis not present

## 2016-08-08 NOTE — Progress Notes (Signed)
This encounter was created in error - please disregard.

## 2016-08-09 DIAGNOSIS — H524 Presbyopia: Secondary | ICD-10-CM | POA: Diagnosis not present

## 2016-08-10 DIAGNOSIS — M62830 Muscle spasm of back: Secondary | ICD-10-CM | POA: Diagnosis not present

## 2016-08-10 DIAGNOSIS — M5136 Other intervertebral disc degeneration, lumbar region: Secondary | ICD-10-CM | POA: Diagnosis not present

## 2016-08-10 DIAGNOSIS — M5441 Lumbago with sciatica, right side: Secondary | ICD-10-CM | POA: Diagnosis not present

## 2016-08-10 DIAGNOSIS — M5137 Other intervertebral disc degeneration, lumbosacral region: Secondary | ICD-10-CM | POA: Diagnosis not present

## 2016-08-15 DIAGNOSIS — M5137 Other intervertebral disc degeneration, lumbosacral region: Secondary | ICD-10-CM | POA: Diagnosis not present

## 2016-08-15 DIAGNOSIS — M62830 Muscle spasm of back: Secondary | ICD-10-CM | POA: Diagnosis not present

## 2016-08-15 DIAGNOSIS — M5441 Lumbago with sciatica, right side: Secondary | ICD-10-CM | POA: Diagnosis not present

## 2016-08-15 DIAGNOSIS — M5136 Other intervertebral disc degeneration, lumbar region: Secondary | ICD-10-CM | POA: Diagnosis not present

## 2016-08-16 DIAGNOSIS — M5137 Other intervertebral disc degeneration, lumbosacral region: Secondary | ICD-10-CM | POA: Diagnosis not present

## 2016-08-16 DIAGNOSIS — M62830 Muscle spasm of back: Secondary | ICD-10-CM | POA: Diagnosis not present

## 2016-08-16 DIAGNOSIS — M5136 Other intervertebral disc degeneration, lumbar region: Secondary | ICD-10-CM | POA: Diagnosis not present

## 2016-08-16 DIAGNOSIS — M5441 Lumbago with sciatica, right side: Secondary | ICD-10-CM | POA: Diagnosis not present

## 2016-08-19 DIAGNOSIS — M62830 Muscle spasm of back: Secondary | ICD-10-CM | POA: Diagnosis not present

## 2016-08-19 DIAGNOSIS — M5136 Other intervertebral disc degeneration, lumbar region: Secondary | ICD-10-CM | POA: Diagnosis not present

## 2016-08-19 DIAGNOSIS — M5137 Other intervertebral disc degeneration, lumbosacral region: Secondary | ICD-10-CM | POA: Diagnosis not present

## 2016-08-19 DIAGNOSIS — M5441 Lumbago with sciatica, right side: Secondary | ICD-10-CM | POA: Diagnosis not present

## 2016-08-22 DIAGNOSIS — M5441 Lumbago with sciatica, right side: Secondary | ICD-10-CM | POA: Diagnosis not present

## 2016-08-22 DIAGNOSIS — M5136 Other intervertebral disc degeneration, lumbar region: Secondary | ICD-10-CM | POA: Diagnosis not present

## 2016-08-22 DIAGNOSIS — M5137 Other intervertebral disc degeneration, lumbosacral region: Secondary | ICD-10-CM | POA: Diagnosis not present

## 2016-08-22 DIAGNOSIS — M62830 Muscle spasm of back: Secondary | ICD-10-CM | POA: Diagnosis not present

## 2016-09-08 DIAGNOSIS — M5441 Lumbago with sciatica, right side: Secondary | ICD-10-CM | POA: Diagnosis not present

## 2016-09-08 DIAGNOSIS — M5136 Other intervertebral disc degeneration, lumbar region: Secondary | ICD-10-CM | POA: Diagnosis not present

## 2016-09-08 DIAGNOSIS — M5137 Other intervertebral disc degeneration, lumbosacral region: Secondary | ICD-10-CM | POA: Diagnosis not present

## 2016-09-08 DIAGNOSIS — M62830 Muscle spasm of back: Secondary | ICD-10-CM | POA: Diagnosis not present

## 2016-09-16 DIAGNOSIS — M62830 Muscle spasm of back: Secondary | ICD-10-CM | POA: Diagnosis not present

## 2016-09-16 DIAGNOSIS — M5137 Other intervertebral disc degeneration, lumbosacral region: Secondary | ICD-10-CM | POA: Diagnosis not present

## 2016-09-16 DIAGNOSIS — M5441 Lumbago with sciatica, right side: Secondary | ICD-10-CM | POA: Diagnosis not present

## 2016-09-16 DIAGNOSIS — M5136 Other intervertebral disc degeneration, lumbar region: Secondary | ICD-10-CM | POA: Diagnosis not present

## 2016-09-22 DIAGNOSIS — M5441 Lumbago with sciatica, right side: Secondary | ICD-10-CM | POA: Diagnosis not present

## 2016-09-22 DIAGNOSIS — M62830 Muscle spasm of back: Secondary | ICD-10-CM | POA: Diagnosis not present

## 2016-09-22 DIAGNOSIS — M5137 Other intervertebral disc degeneration, lumbosacral region: Secondary | ICD-10-CM | POA: Diagnosis not present

## 2016-09-22 DIAGNOSIS — M5136 Other intervertebral disc degeneration, lumbar region: Secondary | ICD-10-CM | POA: Diagnosis not present

## 2016-09-26 DIAGNOSIS — M5137 Other intervertebral disc degeneration, lumbosacral region: Secondary | ICD-10-CM | POA: Diagnosis not present

## 2016-09-26 DIAGNOSIS — M5441 Lumbago with sciatica, right side: Secondary | ICD-10-CM | POA: Diagnosis not present

## 2016-09-26 DIAGNOSIS — M62830 Muscle spasm of back: Secondary | ICD-10-CM | POA: Diagnosis not present

## 2016-09-26 DIAGNOSIS — M5136 Other intervertebral disc degeneration, lumbar region: Secondary | ICD-10-CM | POA: Diagnosis not present

## 2016-09-30 DIAGNOSIS — M62830 Muscle spasm of back: Secondary | ICD-10-CM | POA: Diagnosis not present

## 2016-09-30 DIAGNOSIS — M5136 Other intervertebral disc degeneration, lumbar region: Secondary | ICD-10-CM | POA: Diagnosis not present

## 2016-09-30 DIAGNOSIS — M5137 Other intervertebral disc degeneration, lumbosacral region: Secondary | ICD-10-CM | POA: Diagnosis not present

## 2016-09-30 DIAGNOSIS — M5441 Lumbago with sciatica, right side: Secondary | ICD-10-CM | POA: Diagnosis not present

## 2016-10-03 DIAGNOSIS — M5441 Lumbago with sciatica, right side: Secondary | ICD-10-CM | POA: Diagnosis not present

## 2016-10-03 DIAGNOSIS — M62830 Muscle spasm of back: Secondary | ICD-10-CM | POA: Diagnosis not present

## 2016-10-03 DIAGNOSIS — M5137 Other intervertebral disc degeneration, lumbosacral region: Secondary | ICD-10-CM | POA: Diagnosis not present

## 2016-10-03 DIAGNOSIS — M5136 Other intervertebral disc degeneration, lumbar region: Secondary | ICD-10-CM | POA: Diagnosis not present

## 2016-10-04 DIAGNOSIS — M5137 Other intervertebral disc degeneration, lumbosacral region: Secondary | ICD-10-CM | POA: Diagnosis not present

## 2016-10-04 DIAGNOSIS — M62830 Muscle spasm of back: Secondary | ICD-10-CM | POA: Diagnosis not present

## 2016-10-04 DIAGNOSIS — M5441 Lumbago with sciatica, right side: Secondary | ICD-10-CM | POA: Diagnosis not present

## 2016-10-04 DIAGNOSIS — M5136 Other intervertebral disc degeneration, lumbar region: Secondary | ICD-10-CM | POA: Diagnosis not present

## 2016-10-10 MED FILL — ATORVASTATIN 20 MG TABLET: 20 | 90 days supply | Qty: 90 | Fill #0

## 2016-12-07 ENCOUNTER — Encounter: Payer: Self-pay | Admitting: Family Medicine

## 2016-12-07 ENCOUNTER — Ambulatory Visit (INDEPENDENT_AMBULATORY_CARE_PROVIDER_SITE_OTHER): Payer: 59 | Admitting: Family Medicine

## 2016-12-07 DIAGNOSIS — M25562 Pain in left knee: Secondary | ICD-10-CM

## 2016-12-08 DIAGNOSIS — M25562 Pain in left knee: Secondary | ICD-10-CM | POA: Insufficient documentation

## 2016-12-08 NOTE — Assessment & Plan Note (Signed)
Most likely his pain is associated with arthritic changes with an around the knee. Structurally is intact. - Advised compression - Can use ice or NSAIDs as needed - Could consider an injection in the future if no improvement.

## 2016-12-08 NOTE — Progress Notes (Signed)
  Larry Cruz - 46 y.o. male MRN [id number]  Date of birth: 1971-06-05  SUBJECTIVE:  Including CC & ROS.   Larry Cruz is a 46 year old male that is presenting with left knee pain. This has been occurring for 2-3 weeks. The swelling has gradually improved. He reports no inciting event. He coaches and plays tennis and soccer. He has a history of a similar pain. He has never had any surgery on his knee. Reports the pain is around the patella. It is worse with flexion. He has tried ice and over-the-counter Naprosyn. He has not had any locking but feels like his knee might give way.  ROS: No unexpected weight loss, fever, chills, instability, muscle pain, numbness/tingling, redness, otherwise see HPI   HISTORY: Past Medical, Surgical, Social, and Family History Reviewed & Updated per EMR.   Pertinent Historical Findings include: PMSHx -  back surgeries PSHx -  no tobacco, occasional alcohol use FHx -  hypertension, Alzheimer's  PHYSICAL EXAM:  VS: BP:   HR: bpm  TEMP: ( )  RESP:   HT:6' (182.9 cm)   WT:215 lb (97.5 kg)  BMI:29.2 PHYSICAL EXAM: Gen: NAD, alert, cooperative with exam, well-appearing HEENT: clear conjunctiva, EOMI CV:  no edema, capillary refill brisk,  Resp: non-labored, normal speech Skin: no rashes, normal turgor  Neuro: no gross deficits.  Psych:  alert and oriented Left Knee:  No tender to palpation over the medial lateral joint line. No obvious effusion. Some tenderness on the medial lateral aspect of the patella. No tenderness to palpation over the quadrant patellar tendon. Normal active flexion and extension. Some pain with full flexion. Normal endpoints valgus and varus stress testing. Negative McMurray's test. Neurovascularly intact.  Limited ultrasound: Left knee:  There is a moderate effusion in the suprapatellar pouch. The quadriceps and patellar tendon reviewed in long axis and found to be normal The medial and lateral meniscus were found to be  normal. There were small spurring noted along the medial and lateral joint line. The trochanteric groove appears to have adequate space. There was some spurring occurring in the chart clear groove.   Summary: These findings are consistent with mild arthritic changes.  Ultrasound and interpretation by Clearance Coots, MD   ASSESSMENT & PLAN:   Left knee pain Most likely his pain is associated with arthritic changes with an around the knee. Structurally is intact. - Advised compression - Can use ice or NSAIDs as needed - Could consider an injection in the future if no improvement.

## 2017-04-27 ENCOUNTER — Other Ambulatory Visit: Payer: Self-pay | Admitting: Family Medicine

## 2017-04-27 MED FILL — ATORVASTATIN 20 MG TABLET: 20 | 90 days supply | Qty: 90 | Fill #1

## 2017-04-27 MED FILL — VALACYCLOVIR HCL 500 MG TAB: 500 | 15 days supply | Qty: 30 | Fill #0

## 2017-04-27 NOTE — Telephone Encounter (Signed)
Spoke with patient and he needs both.

## 2017-04-27 NOTE — Telephone Encounter (Signed)
He has appt 8/22.  ambien last filled #30 11/1.  If he truly needs both meds before then, okay to refill #30 today for both meds, with no refills

## 2017-04-27 NOTE — Telephone Encounter (Signed)
Are these okay to refill? 

## 2017-04-28 MED FILL — ZOLPIDEM TARTRATE 10 MG TAB: 10 | 30 days supply | Qty: 30 | Fill #0
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
## 2017-05-10 ENCOUNTER — Ambulatory Visit (INDEPENDENT_AMBULATORY_CARE_PROVIDER_SITE_OTHER): Payer: 59 | Admitting: Family Medicine

## 2017-05-10 ENCOUNTER — Encounter: Payer: Self-pay | Admitting: Family Medicine

## 2017-05-10 VITALS — BP 124/78 | HR 72 | Ht 72.5 in | Wt 218.4 lb

## 2017-05-10 DIAGNOSIS — R5383 Other fatigue: Secondary | ICD-10-CM | POA: Diagnosis not present

## 2017-05-10 DIAGNOSIS — A6000 Herpesviral infection of urogenital system, unspecified: Secondary | ICD-10-CM

## 2017-05-10 DIAGNOSIS — R002 Palpitations: Secondary | ICD-10-CM

## 2017-05-10 DIAGNOSIS — E7800 Pure hypercholesterolemia, unspecified: Secondary | ICD-10-CM

## 2017-05-10 DIAGNOSIS — Z0000 Encounter for general adult medical examination without abnormal findings: Secondary | ICD-10-CM | POA: Diagnosis not present

## 2017-05-10 DIAGNOSIS — G4700 Insomnia, unspecified: Secondary | ICD-10-CM | POA: Diagnosis not present

## 2017-05-10 LAB — POCT URINALYSIS DIP (PROADVANTAGE DEVICE)
Bilirubin, UA: NEGATIVE
Glucose, UA: NEGATIVE mg/dL
Ketones, POC UA: NEGATIVE mg/dL
LEUKOCYTES UA: NEGATIVE
NITRITE UA: NEGATIVE
PH UA: 7 (ref 5.0–8.0)
RBC UA: NEGATIVE
Specific Gravity, Urine: 1.015
UUROB: NEGATIVE

## 2017-05-10 LAB — CBC WITH DIFFERENTIAL/PLATELET
BASOS ABS: 0 {cells}/uL (ref 0–200)
Basophils Relative: 0 %
Eosinophils Absolute: 171 cells/uL (ref 15–500)
Eosinophils Relative: 3 %
HEMATOCRIT: 45.3 % (ref 38.5–50.0)
HEMOGLOBIN: 15.7 g/dL (ref 13.2–17.1)
LYMPHS ABS: 1710 {cells}/uL (ref 850–3900)
Lymphocytes Relative: 30 %
MCH: 31.8 pg (ref 27.0–33.0)
MCHC: 34.7 g/dL (ref 32.0–36.0)
MCV: 91.7 fL (ref 80.0–100.0)
MONO ABS: 456 {cells}/uL (ref 200–950)
MPV: 11.3 fL (ref 7.5–12.5)
Monocytes Relative: 8 %
NEUTROS PCT: 59 %
Neutro Abs: 3363 cells/uL (ref 1500–7800)
Platelets: 198 10*3/uL (ref 140–400)
RBC: 4.94 MIL/uL (ref 4.20–5.80)
RDW: 13.8 % (ref 11.0–15.0)
WBC: 5.7 10*3/uL (ref 4.0–10.5)

## 2017-05-10 LAB — TSH: TSH: 1.34 mIU/L (ref 0.40–4.50)

## 2017-05-10 NOTE — Patient Instructions (Signed)
HEALTH MAINTENANCE RECOMMENDATIONS:  It is recommended that you get at least 30 minutes of aerobic exercise at least 5 days/week (for weight loss, you may need as much as 60-90 minutes). This can be any activity that gets your heart rate up. This can be divided in 10-15 minute intervals if needed, but try and build up your endurance at least once a week.  Weight bearing exercise is also recommended twice weekly.  Eat a healthy diet with lots of vegetables, fruits and fiber.  "Colorful" foods have a lot of vitamins (ie green vegetables, tomatoes, red peppers, etc).  Limit sweet tea, regular sodas and alcoholic beverages, all of which has a lot of calories and sugar.  Up to 2 alcoholic drinks daily may be beneficial for men (unless trying to lose weight, watch sugars).  Drink a lot of water.  Sunscreen of at least SPF 30 should be used on all sun-exposed parts of the skin when outside between the hours of 10 am and 4 pm (not just when at beach or pool, but even with exercise, golf, tennis, and yard work!)  Use a sunscreen that says "broad spectrum" so it covers both UVA and UVB rays, and make sure to reapply every 1-2 hours.  Remember to change the batteries in your smoke detectors when changing your clock times in the spring and fall.  Use your seat belt every time you are in a car, and please drive safely and not be distracted with cell phones and texting while driving.  Call the pharmacy when you need refills on your Lipitor.  We should have your test results back to you in 1-2 days through [facility]. Stay well hydrated to avoid any SVT.  Sometimes decongestants (the sudafed in Zyrtec-D) can also be a factor. If you are having more frequent episodes, please let us know.    Supraventricular Tachycardia, Adult Supraventricular tachycardia (SVT) is a type of abnormal heart rhythm. It causes the heart to beat very quickly and then return to a normal speed. A normal heart rate is 60-100 beats per  minute. During an episode of SVT, your heart rate may be higher than 150 beats per minute. Episodes of SVT can be frightening, but they are usually not dangerous. However, if episodes happens often or last for long periods of time, they may lead to heart failure. What are the causes? Usually, a normal heartbeat starts when an area called the sinoatrial node releases an electrical signal. In SVT, other areas of the heart send out electrical signals that interfere with the signal from the sinoatrial node. It is not known why some people get SVT and others do not. What increases the risk? This condition is more likely to develop in:  People who are 48?48 years old.  Women.  Factors that may increase your chances of an attack include:  Stress.  Tiredness.  Smoking.  Stimulant drugs, such as cocaine and methamphetamine.  Alcohol.  Caffeine.  Pregnancy.  Anxiety.  What are the signs or symptoms? Symptoms of this condition include:  A pounding heart.  A feeling that the heart is skipping beats (palpitations).  Weakness.  Shortness of breath.  Tightness or pain in your chest.  Light-headedness.  Anxiety.  Dizziness.  Sweating.  Nausea.  Fainting.  Fatigue or tiredness.  A mild episode may not cause symptoms. How is this diagnosed? This condition may be diagnosed based on:  Your symptoms.  A physical exam. If you are have an episode of SVT during  the exam, the health care provider may be able to diagnose SVT by listening to your heart and feeling your pulse.  Tests. These may include: ? An electrocardiogram (ECG). This test is done to check for problems with electrical activity in the heart. ? A Holter monitor or event monitor test. This test involves wearing a portable device that monitors your heart rate over time. ? An echocardiogram. This test involves taking an image of your heart using sound waves. It is done to rule out other causes of a fast heart  rate. ? Blood tests.  How is this treated? This condition may be treated with:  Vagal nerve stimulation. The treatment involves stimulating your vagus nerve, which slows down the heart. It is often the first and only treatment that is needed for this condition. It is a good idea to try the several ways of doing vagal stimulation to find which one works best for you. Ways to do this treatment include: ? Holding your breath and pushing, as though you are having a bowel movement. ? Massaging an area on one side of your neck, below your jaw. Do not try this yourself. Only a health care provider should do this. If done the wrong way, it can lead to a stroke. ? Bending forward with your head between your legs. ? Coughing while bending forward with your head between your legs. ? Closing your eyes and massaging your eyeballs. A health care provider should guide you through this method before you try it on your own.  Medicines that prevent attacks.  Medicine to stop an attack. The medicine is given through an IV tube at the hospital.  A small electric shock (cardioversion) that stops an attack. Before you get the shock, you will get medicine to make you fall asleep.  Radiofrequency ablation. In this procedure, a small, thin tube (catheter) is used to send radiofrequency energy to the area of tissue that is causing the rapid heartbeats. The energy kills the cells and helps your heart keep a normal rhythm. You may have this treatment if you have symptoms of SVT often.  If you do not have symptoms, you may not need treatment. Follow these instructions at home: Stress  Avoid stressful situations when possible.  Find healthy ways of managing stress that work for you. Some healthy ways to manage stress include: ? Taking part in relaxing activities, such as yoga, meditation, or being out in nature. ? Listening to relaxing music. ? Practicing relaxation techniques, such as deep breathing. ? Leading a  healthy lifestyle. This involves getting plenty of sleep, exercising, and eating a balanced diet. ? Attending counseling or talk therapy with a mental health professional. Sleep  Try to get at least 7 hours of sleep each night. Tobacco and nicotine  Do not use any products that contain nicotine or tobacco, such as cigarettes and e-cigarettes. If you need help quitting, ask your health care provider. Alcohol  If alcohol triggers episodes of SVT, do not drink alcohol.  If alcohol does not seem to trigger episodes, limit alcohol intake to no more than 1 drink a day for nonpregnant women and 2 drinks a day for men. One drink equals 12 oz of beer, 5 oz of wine, or 1 oz of hard liquor. Caffeine  If caffeine triggers episodes of SVT, do not eat, drink, or use anything with caffeine in it.  If caffeine does not seem to trigger episodes, consume caffeine in moderation. Stimulant drugs  Do not use  stimulant drugs. If you need help quitting, talk with your health care provider. General instructions  Maintain a healthy weight.  Exercise regularly. Ask your health care provider to suggest some good activities for you. Aim for one or a combination of the following: ? 150 minutes per week of moderate exercise, such as walking or yoga. ? 75 minutes per week of vigorous exercise, such as running or swimming.  Perform vagus nerve stimulation as directed by your health care provider.  Take over-the-counter and prescription medicines only as told by your health care provider. Contact a health care provider if:  You have episodes of SVT more often than before.  Episodes of SVT last longer than before.  Vagus nerve stimulation is no longer helping.  You have new symptoms. Get help right away if:  You have chest pain.  Your symptoms get worse.  You have trouble breathing.  You have an episode of SVT that lasts longer than 20 minutes.  You faint. These symptoms may represent a serious  problem that is an emergency. Do not wait to see if the symptoms will go away. Get medical help right away. Call your local emergency services (911 in the [place] Do not drive yourself to the hospital. This information is not intended to replace advice given to you by your health care provider. Make sure you discuss any questions you have with your health care provider. Document Released: 09/05/2005 Document Revised: 05/12/2016 Document Reviewed: 05/12/2016 Elsevier Interactive Patient Education  2017 [employer].

## 2017-05-11 LAB — LIPID PANEL
CHOLESTEROL: 204 mg/dL — AB (ref <200)
HDL: 47 mg/dL (ref >40)
LDL CALC: 129 mg/dL — AB (ref <100)
TRIGLYCERIDES: 139 mg/dL (ref <150)
Total CHOL/HDL Ratio: 4.3 Ratio (ref <5.0)
VLDL: 28 mg/dL (ref <30)

## 2017-05-11 LAB — COMPREHENSIVE METABOLIC PANEL
ALBUMIN: 4.8 g/dL (ref 3.6–5.1)
ALK PHOS: 90 U/L (ref 40–115)
ALT: 31 U/L (ref 9–46)
AST: 28 U/L (ref 10–40)
BILIRUBIN TOTAL: 0.9 mg/dL (ref 0.2–1.2)
BUN: 11 mg/dL (ref 7–25)
CALCIUM: 9.6 mg/dL (ref 8.6–10.3)
CO2: 24 mmol/L (ref 20–32)
Chloride: 100 mmol/L (ref 98–110)
Creat: 1.01 mg/dL (ref 0.60–1.35)
Glucose, Bld: 86 mg/dL (ref 65–99)
Potassium: 4.4 mmol/L (ref 3.5–5.3)
Sodium: 138 mmol/L (ref 135–146)
Total Protein: 7.3 g/dL (ref 6.1–8.1)

## 2017-05-24 DIAGNOSIS — L570 Actinic keratosis: Secondary | ICD-10-CM | POA: Diagnosis not present

## 2017-05-24 DIAGNOSIS — Z86018 Personal history of other benign neoplasm: Secondary | ICD-10-CM | POA: Diagnosis not present

## 2017-05-24 DIAGNOSIS — D225 Melanocytic nevi of trunk: Secondary | ICD-10-CM | POA: Diagnosis not present

## 2017-05-24 DIAGNOSIS — D1801 Hemangioma of skin and subcutaneous tissue: Secondary | ICD-10-CM | POA: Diagnosis not present

## 2017-05-24 DIAGNOSIS — L821 Other seborrheic keratosis: Secondary | ICD-10-CM | POA: Diagnosis not present

## 2017-05-24 DIAGNOSIS — L814 Other melanin hyperpigmentation: Secondary | ICD-10-CM | POA: Diagnosis not present

## 2017-05-24 DIAGNOSIS — L219 Seborrheic dermatitis, unspecified: Secondary | ICD-10-CM | POA: Diagnosis not present

## 2017-08-15 DIAGNOSIS — H524 Presbyopia: Secondary | ICD-10-CM | POA: Diagnosis not present

## 2017-08-15 DIAGNOSIS — H5213 Myopia, bilateral: Secondary | ICD-10-CM | POA: Diagnosis not present

## 2017-08-15 DIAGNOSIS — H52202 Unspecified astigmatism, left eye: Secondary | ICD-10-CM | POA: Diagnosis not present

## 2017-09-19 DIAGNOSIS — G039 Meningitis, unspecified: Secondary | ICD-10-CM

## 2017-09-19 HISTORY — DX: Meningitis, unspecified: G03.9

## 2017-09-19 LAB — HM HEPATITIS C SCREENING LAB: HM Hepatitis Screen: NEGATIVE

## 2017-09-28 ENCOUNTER — Other Ambulatory Visit: Payer: Self-pay | Admitting: Family Medicine

## 2017-09-28 DIAGNOSIS — E7800 Pure hypercholesterolemia, unspecified: Secondary | ICD-10-CM

## 2017-09-28 MED FILL — ATORVASTATIN 20 MG TABLET: 20 | 90 days supply | Qty: 90 | Fill #0

## 2017-10-24 ENCOUNTER — Other Ambulatory Visit: Payer: Self-pay | Admitting: Family Medicine

## 2017-10-24 MED FILL — VALACYCLOVIR HCL 500 MG TAB: 500 | 15 days supply | Qty: 30 | Fill #0

## 2017-10-24 MED FILL — ZOLPIDEM TARTRATE 10 MG TAB: 10 | 30 days supply | Qty: 30 | Fill #0

## 2017-10-24 NOTE — Telephone Encounter (Signed)
Is this okay to refill? 

## 2018-03-19 DIAGNOSIS — M7711 Lateral epicondylitis, right elbow: Secondary | ICD-10-CM

## 2018-03-19 HISTORY — DX: Lateral epicondylitis, right elbow: M77.11

## 2018-04-09 ENCOUNTER — Ambulatory Visit: Payer: 59 | Admitting: Sports Medicine

## 2018-04-09 ENCOUNTER — Encounter: Payer: Self-pay | Admitting: Sports Medicine

## 2018-04-09 VITALS — BP 124/88 | Ht 73.0 in | Wt 220.0 lb

## 2018-04-09 DIAGNOSIS — M7711 Lateral epicondylitis, right elbow: Secondary | ICD-10-CM | POA: Diagnosis not present

## 2018-04-09 MED ORDER — METHYLPREDNISOLONE ACETATE 40 MG/ML IJ SUSP: 40.0000 mg | Freq: Once | INTRAMUSCULAR | Status: DC

## 2018-04-09 NOTE — Progress Notes (Signed)
Subjective:    Patient ID: Larry Cruz is a 47 y.o. male.  Chief Complaint:    Social History   Occupational History  . Occupation: Editor, commissioning: [employer]  Tobacco Use  . Smoking status: Never Smoker  . Smokeless tobacco: Never Used  Substance and Sexual Activity  . Alcohol use: Yes    Comment: 1 drink 3-4 times per week.  . Drug use: No  . Sexual activity: Yes    Partners: Female    Birth control/protection: Surgical    Comment: vasectomy    ROS       Objective:   Ortho Exam      Assessment:       Plan:

## 2018-04-09 NOTE — Progress Notes (Signed)
   Subjective:    Patient ID: Carola Rhine, male    DOB: [birth year], 47 y.o.   MRN: [id number]  HPI complaint: Right elbow pain  Very pleasant 47 year old male comes in today complaining of right elbow pain which has been present now for several months. He denies any specific trauma but rather describes a gradual onset of pain that may have begun after playing tennis and doing quite a bit of yard work in the spring. He describes an achy discomfort along the lateral elbow with some radiating pain to the medial elbow. He has tried icing, scheduled over-the-counter anti-inflammatories, counterforce brace, and rest with no significant improvement in symptoms. Pain is now awakening him at night. He denies numbness or tingling. He denies any prior occurrences.  Interim medical history reviewed Medications reviewed Allergies reviewed    Review of Systems As above    Objective:   Physical Exam  Well-developed, well-nourished.No acute distress. Awake alert and oriented 3. Vital signs reviewed.  Right elbow: Full range of motion. No effusion. No soft tissue swelling. Tenderness to palpation directly over the lateral epicondyle with reproducible pain resisted ECRB testing.No tenderness over the medial epicondyle. Good grip strength. Good pulses.  MSK ultrasound of the right elbow shows hypoechoic changes diffusely throughout the common extensor tendon but no discrete tear.No spurring seen. Findings consistent with lateral epicondylitis      Assessment & Plan:    right elbow pain secondary to lateral epicondylitis  Patient's right common extensor tendon was injected with cortisone today after risks and benefits were explained to him. He tolerated this without difficulty. He will start phase I of a home exercise program consisting of icing and stretching and he will move to phase II in 2 weeks (eccentric strengthening). No tennis. Follow-up with me in 4 weeks for reevaluation. If symptoms  persist consider further diagnostic imaging. Call with questions or concerns in the interim.  Consent obtained and verified. Time-out conducted. Noted no overlying erythema, induration, or other signs of local infection. Skin prepped in a sterile fashion. Topical analgesic spray: Ethyl chloride. Joint: right lateral epicondyle Needle: 25g 5/8 inch Completed without difficulty. Meds: 1cc (40mg ) depomedrol, 1cc 1% xylocaine  Advised to call if fevers/chills, erythema, induration, drainage, or persistent bleeding.

## 2018-04-10 ENCOUNTER — Telehealth: Payer: Self-pay | Admitting: *Deleted

## 2018-04-10 MED ORDER — PREDNISONE 10 MG PO TABS: ORAL_TABLET | ORAL | 0 refills | Status: DC

## 2018-04-10 MED FILL — predniSONE 10 MG TABS: 10 | 6 days supply | Qty: 21 | Fill #0

## 2018-04-10 NOTE — Telephone Encounter (Signed)
Called in 6 day dose pak in to his pharmacy

## 2018-04-11 ENCOUNTER — Telehealth: Payer: Self-pay | Admitting: Sports Medicine

## 2018-04-11 NOTE — Telephone Encounter (Signed)
Larry Cruz called yesterday after developing acute pain in his elbow after the cortisone injection the day before. I spoke with him on the phone. No erythema or swelling. This sounds like a classic steroid flare. He was started on a 6 day Sterapred Dosepak and a follow-up phone call was made this morning, July 24. His pain was improving. I've reassured him that his pain should continue to improve to the point of resolution over the next 24-48 hours. If his pain worsens or he develops erythema or swelling he will need to contact me again. Otherwise, I will plan on seeing him at his follow-up visit in a few weeks.

## 2018-04-11 NOTE — Telephone Encounter (Signed)
-----   Message from Cyd Silence, [place] sent at 04/10/2018  9:36 AM EDT ----- Regarding: FW: vm message   ----- Message ----- From: Carolyne Littles Sent: 04/10/2018   8:18 AM To: Cyd Silence, CMA Subject: vm message                                     Pt left vm saying he is in unbearable pain, asking for a call back.

## 2018-04-18 ENCOUNTER — Encounter (HOSPITAL_COMMUNITY): Payer: Self-pay

## 2018-04-18 ENCOUNTER — Ambulatory Visit (HOSPITAL_COMMUNITY)
Admission: EM | Admit: 2018-04-18 | Discharge: 2018-04-18 | Disposition: A | Discharge status: Home / Self Care | Payer: 59 | Attending: Urgent Care | Admitting: Urgent Care

## 2018-04-18 DIAGNOSIS — R5381 Other malaise: Secondary | ICD-10-CM

## 2018-04-18 DIAGNOSIS — L568 Other specified acute skin changes due to ultraviolet radiation: Secondary | ICD-10-CM

## 2018-04-18 DIAGNOSIS — G43809 Other migraine, not intractable, without status migrainosus: Secondary | ICD-10-CM

## 2018-04-18 DIAGNOSIS — Z8669 Personal history of other diseases of the nervous system and sense organs: Secondary | ICD-10-CM

## 2018-04-18 DIAGNOSIS — R52 Pain, unspecified: Secondary | ICD-10-CM

## 2018-04-18 MED ORDER — ONDANSETRON 4 MG PO TBDP
8.0000 mg | ORAL_TABLET | Freq: Once | ORAL | Status: AC
  Administered 2018-04-18: 8 mg via ORAL

## 2018-04-18 MED ORDER — ONDANSETRON 4 MG PO TBDP
ORAL_TABLET | ORAL | Status: AC
  Filled 2018-04-18: qty 2

## 2018-04-18 MED ORDER — KETOROLAC TROMETHAMINE 60 MG/2ML IM SOLN
INTRAMUSCULAR | Status: AC
  Filled 2018-04-18: qty 2

## 2018-04-18 MED ORDER — KETOROLAC TROMETHAMINE 60 MG/2ML IM SOLN
60.0000 mg | Freq: Once | INTRAMUSCULAR | Status: AC
  Administered 2018-04-18: 60 mg via INTRAMUSCULAR

## 2018-04-18 MED ORDER — ONDANSETRON 8 MG PO TBDP: 8.0000 mg | ORAL_TABLET | Freq: Three times a day (TID) | ORAL | 0 refills | Status: DC | PRN

## 2018-04-18 NOTE — ED Provider Notes (Signed)
  MRN: [id number] DOB: 09-Feb-1971  Subjective:   Larry Cruz is a 47 y.o. male presenting for 3 day history of constant, right temporal headache that radiates across his scalp to the left side. Headache is pounding in nature, has mild associated photosensitivity, subjective fever, intermittent/transient dizziness. Has had mild congestion, had body aches initially but is now resolved. Felt like he had the flu but the headache is persisting. Has a history of migraines, has not had one in years and this headache is different. Has tried APAP, Aleve, Ambien, otc cold/flu medications.  Tries to hydrate well with 4-5 cups of water daily. Denies smoking cigarettes.    Current Facility-Administered Medications:  .  methylPREDNISolone acetate (DEPO-MEDROL) injection 40 mg, 40 mg, Intra-articular, Once, Lilia Argue R, DO  Current Outpatient Medications:  .  atorvastatin (LIPITOR) 20 MG tablet, TAKE 1 TABLET BY MOUTH DAILY., Disp: 90 tablet, Rfl: 2 .  Cetirizine-Pseudoephedrine (ZYRTEC-D PO), Take 1 tablet by mouth 2 (two) times daily as needed., Disp: , Rfl:  .  predniSONE (DELTASONE) 10 MG tablet, Take as directed per MD instructions, Disp: 21 tablet, Rfl: 0 .  valACYclovir (VALTREX) 500 MG tablet, TAKE 1 TABLET BY MOUTH 2 TIMES DAILY. USE FOR TWICE DAILY FOR 3 DAYS AS NEEDED FOR OUTBREAKS, Disp: 30 tablet, Rfl: 0 .  zolpidem (AMBIEN) 10 MG tablet, TAKE 1 TABLET BY MOUTH NIGHTLY AT BEDTIME AS NEEDED, Disp: 30 tablet, Rfl: 0   No Known Allergies  Past Medical History:  Diagnosis Date  . Colorblind   . Dyslipidemia   . 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 Low back pain      Past Surgical History:  Procedure Laterality Date  . CERVICAL DISC SURGERY  2009   JONES, MD; fusion  . LUMBAR DISC SURGERY  2000   CARTER, MD; L4-L5 (right)  . LUMBAR DISC SURGERY  11/2013   L4-5, Dr. Ronnald Ramp (left)  . VASECTOMY  11/09    Objective:   Vitals: BP (!) 138/98 (BP Location: Left Arm)   Pulse 76   Temp 98.2  F (36.8 C) (Temporal)   Resp 20   SpO2 97%   BP Readings from Last 3 Encounters:  04/18/18 (!) 138/98  04/09/18 124/88  05/10/17 124/78    Physical Exam  Constitutional: He is oriented to person, place, and time. He appears well-developed and well-nourished.  HENT:  Right Ear: Tympanic membrane normal.  Left Ear: Tympanic membrane normal.  Nose: No mucosal edema, rhinorrhea or sinus tenderness.  Mouth/Throat: Oropharynx is clear and moist.  Eyes: Pupils are equal, round, and reactive to light. EOM are normal.  Photosensitivity present.  Neck: Normal range of motion. Neck supple.  Cardiovascular: Normal rate.  Pulmonary/Chest: Effort normal.  Neurological: He is alert and oriented to person, place, and time. No cranial nerve deficit. Coordination normal.  Skin: Skin is warm and dry.  Psychiatric: He has a normal mood and affect.   Assessment and Plan :   Other migraine without status migrainosus, not intractable  Malaise  History of migraine  Photosensitivity  Body aches  IM Toradol and p.o. Zofran in clinic.  Patient's headache has a lot of migraine characteristics and with his history, counseled that we should try and abort the migraine today with the aforementioned medications.  Patient was agreeable to this.  Counseled on headache management as an outpatient.  Follow-up as needed.

## 2018-04-18 NOTE — ED Triage Notes (Signed)
Pt presents with cold symptoms, ear pain and headache

## 2018-04-18 NOTE — Discharge Instructions (Addendum)
You may take 500mg Tylenol with ibuprofen 400-600mg every 6 hours for pain and inflammation. °

## 2018-04-19 ENCOUNTER — Ambulatory Visit (INDEPENDENT_AMBULATORY_CARE_PROVIDER_SITE_OTHER): Payer: 59 | Admitting: Family Medicine

## 2018-04-19 ENCOUNTER — Encounter: Payer: Self-pay | Admitting: Family Medicine

## 2018-04-19 VITALS — BP 132/88 | HR 68 | Ht 73.0 in | Wt 224.0 lb

## 2018-04-19 DIAGNOSIS — R51 Headache: Secondary | ICD-10-CM | POA: Diagnosis not present

## 2018-04-19 DIAGNOSIS — R519 Headache, unspecified: Secondary | ICD-10-CM

## 2018-04-19 DIAGNOSIS — R509 Fever, unspecified: Secondary | ICD-10-CM

## 2018-04-19 MED ORDER — DOXYCYCLINE HYCLATE 100 MG PO TABS: 100.0000 mg | ORAL_TABLET | Freq: Two times a day (BID) | ORAL | 0 refills | Status: DC

## 2018-04-19 MED ORDER — METHYLPREDNISOLONE ACETATE 80 MG/ML IJ SUSP
80.0000 mg | Freq: Once | INTRAMUSCULAR | Status: AC
  Administered 2018-04-19: 80 mg via INTRAMUSCULAR

## 2018-04-19 MED FILL — DOXYCYCLINE HYCLATE 100 MG: 100 | 7 days supply | Qty: 14 | Fill #0

## 2018-04-19 NOTE — Patient Instructions (Signed)
We are giving you steroid injection today to help with the headache.  We are doing tests for more concerning etiologies for your headache. We are going to treat you presumptively for rocky [place] spotted fever--this diagnosis is not tremendously likely, but better to be safe than sorry, and treat while awaiting the test results. Avoid the sun while on the antibiotic.  We will contact you with some of your results tomorrow, and the others as they come in, through [facility].

## 2018-04-19 NOTE — Progress Notes (Signed)
Chief Complaint  Patient presents with  . Headache    x 4 days. Did go to UC and had tordol shot, helped some. Was able to get some rest  last night finally. Still has a HA and it is pretty steady, sharp at times. Eyeballs hurt. Feels like he had the flu this past Sunday, body aches and low grade temp-felt better by Tues but HA was still there.    Patient presents with complaint of persistent headache.  The headache is at both temples, and spreads across the front/top of his head, more on the right side than the left.  Started 7/28--went to bed without a headache, headache woke him up in the middle of the night. Couldn't get back to sleep due to pain. Went to work on 7/29, whole body ached, persistent headache. Didn't sleep that night, or Tuesday night.  Just slight congestion.  He had low grade fever, 100 (which is high for him, doesn't usually run fevers). Every joint hurt, even his teeth hurt.  Use neti-pot this morning, was clear.  Joint and tooth pain got better 2 days ago, but the headache persisted.  Fever has also resolved.  Headache currently is milder, but when bad it is pounding/throbbing. He had taken excedrin migraine earlier today.  Yesterday had slight nausea, some light sensitivity (not like "typical true migraine"--this feels different).  Went to UC last night, treated with IM toradol and zofran, helped some in that he was actually able to sleep some last night. Woke up at 6am with a pounding headache again, like yesterday.   Took zyrtec D and excedrin migraine today (11 am), slightly better. Head feels worse with quick motions.  Had cortisone shot in R elbow last week 7/22, the next day he was in unbearable pain, could barely move his arm.  He was given steroid dosepak; completed the pills on Sunday morning (headache started that night).   Arm is feeling better.  H/o migraines, no true migraine in 15 years. No known tick bites or rashes. No jaw pain or vision changes No other  neurologic symptoms, intermittent mild nausea  PMH, PSH, SH reviewed  Outpatient Encounter Medications as of 04/19/2018  Medication Sig Note  . atorvastatin (LIPITOR) 20 MG tablet TAKE 1 TABLET BY MOUTH DAILY.   Marland Kitchen Cetirizine-Pseudoephedrine (ZYRTEC-D PO) Take 1 tablet by mouth 2 (two) times daily as needed. 05/10/2017: Has 12 hour version, taking just in the mornings  . zolpidem (AMBIEN) 10 MG tablet TAKE 1 TABLET BY MOUTH NIGHTLY AT BEDTIME AS NEEDED   . doxycycline (VIBRA-TABS) 100 MG tablet Take 1 tablet (100 mg total) by mouth 2 (two) times daily.   . ondansetron (ZOFRAN-ODT) 8 MG disintegrating tablet Take 1 tablet (8 mg total) by mouth every 8 (eight) hours as needed for nausea. (Patient not taking: Reported on 04/19/2018)   . valACYclovir (VALTREX) 500 MG tablet TAKE 1 TABLET BY MOUTH 2 TIMES DAILY. USE FOR TWICE DAILY FOR 3 DAYS AS NEEDED FOR OUTBREAKS (Patient not taking: Reported on 04/19/2018)   . [DISCONTINUED] predniSONE (DELTASONE) 10 MG tablet Take as directed per MD instructions    Facility-Administered Encounter Medications as of 04/19/2018  Medication  . methylPREDNISolone acetate (DEPO-MEDROL) injection 40 mg  . [COMPLETED] methylPREDNISolone acetate (DEPO-MEDROL) injection 80 mg   No Known Allergies  ROS: fevers, myalgias and headache per HPI.  No vomiting, diarrhea, numbness, tingling or other neuro complaints.  No chest pain, shortness of breath, cough.  Mild congestion.  No skin  rashes. See HPI  PHYSICAL EXAM:  BP 132/88   Pulse 68   Ht 6' 1"  (1.854 m)   Wt 224 lb (101.6 kg)   BMI 29.55 kg/m    T98  Well-appearing, though somewhat tired-appearing male, in no acute distress HEENT: conjunctiva and sclera are clear. PERRL, EOMI, fundi benign. He is mildly tender at right temporal artery as well as R temporalis muscle. Sinuses are nontender.   TM's and EAC's normal, OP is clear. Nasal mucosa is moderately edematous, no erythema or purulence. Sinuses are nontender. Neck:  no lymphadenopathy or mass Heart: regular rate and rhythm Lungs: clear bilaterally Abdomen: soft, nontender, no mass Back: no CVA tenderness Skin: normal turgor, no rash. Neuro: alert and oriented, cranial nerves intact. Normal finger to nose, no pronator drift, normal strength, sensation, DTR's, tandem gait  ASSESSMENT/PLAN:  Acute intractable headache, unspecified headache type - normal neuro exam; in setting of viral syndrome. some temp art tenderness. Check labs for TA, RMSF, treat presumptively - Plan: CBC with Differential/Platelet, Sedimentation Rate, [place] spotted fvr ab, IgM-blood, methylPREDNISolone acetate (DEPO-MEDROL) injection 80 mg  Febrile illness - Plan: CBC with Differential/Platelet, [place] spotted fvr ab, IgM-blood, doxycycline (VIBRA-TABS) 100 MG tablet   Ddx includes RMSF, TA, viral syndrome,   ESR, CBC, RMSF titer  Depo medrol 33m IM; risks/side effects reviewed

## 2018-04-20 ENCOUNTER — Encounter: Payer: Self-pay | Admitting: Family Medicine

## 2018-04-20 MED ORDER — SUMATRIPTAN SUCCINATE 100 MG PO TABS: ORAL_TABLET | ORAL | 0 refills | Status: DC

## 2018-04-20 MED FILL — SUMATRIPTAN SUCC 100 MG TAB: 100 | 30 days supply | Qty: 9 | Fill #0

## 2018-04-21 ENCOUNTER — Other Ambulatory Visit: Payer: Self-pay

## 2018-04-21 ENCOUNTER — Encounter (HOSPITAL_COMMUNITY): Payer: Self-pay | Admitting: Emergency Medicine

## 2018-04-21 ENCOUNTER — Emergency Department (HOSPITAL_COMMUNITY): Payer: 59

## 2018-04-21 ENCOUNTER — Observation Stay (HOSPITAL_COMMUNITY)
Admission: EM | Admit: 2018-04-21 | Discharge: 2018-04-23 | Disposition: A | Discharge status: Home / Self Care | Payer: 59 | Attending: Internal Medicine | Admitting: Internal Medicine

## 2018-04-21 DIAGNOSIS — R42 Dizziness and giddiness: Secondary | ICD-10-CM | POA: Diagnosis not present

## 2018-04-21 DIAGNOSIS — B009 Herpesviral infection, unspecified: Secondary | ICD-10-CM | POA: Diagnosis not present

## 2018-04-21 DIAGNOSIS — A879 Viral meningitis, unspecified: Secondary | ICD-10-CM | POA: Diagnosis not present

## 2018-04-21 DIAGNOSIS — M545 Low back pain: Secondary | ICD-10-CM | POA: Diagnosis not present

## 2018-04-21 DIAGNOSIS — Z79899 Other long term (current) drug therapy: Secondary | ICD-10-CM | POA: Diagnosis not present

## 2018-04-21 DIAGNOSIS — G030 Nonpyogenic meningitis: Secondary | ICD-10-CM

## 2018-04-21 DIAGNOSIS — R5383 Other fatigue: Secondary | ICD-10-CM | POA: Diagnosis not present

## 2018-04-21 DIAGNOSIS — R51 Headache: Secondary | ICD-10-CM | POA: Diagnosis not present

## 2018-04-21 DIAGNOSIS — E785 Hyperlipidemia, unspecified: Secondary | ICD-10-CM | POA: Diagnosis not present

## 2018-04-21 DIAGNOSIS — G43909 Migraine, unspecified, not intractable, without status migrainosus: Secondary | ICD-10-CM | POA: Diagnosis present

## 2018-04-21 LAB — BASIC METABOLIC PANEL
Anion gap: 9 (ref 5–15)
BUN: 16 mg/dL (ref 6–20)
CO2: 28 mmol/L (ref 22–32)
Calcium: 9.4 mg/dL (ref 8.9–10.3)
Chloride: 101 mmol/L (ref 98–111)
Creatinine, Ser: 1.23 mg/dL (ref 0.61–1.24)
GFR calc Af Amer: >60 mL/min (ref >60)
GLUCOSE: 110 mg/dL — AB (ref 70–99)
POTASSIUM: 4.4 mmol/L (ref 3.5–5.1)
Sodium: 138 mmol/L (ref 135–145)

## 2018-04-21 LAB — CBC
HEMATOCRIT: 46.8 % (ref 39.0–52.0)
Hemoglobin: 15.7 g/dL (ref 13.0–17.0)
MCH: 31.4 pg (ref 26.0–34.0)
MCHC: 33.5 g/dL (ref 30.0–36.0)
MCV: 93.6 fL (ref 78.0–100.0)
Platelets: 181 10*3/uL (ref 150–400)
RBC: 5 MIL/uL (ref 4.22–5.81)
RDW: 13.1 % (ref 11.5–15.5)
WBC: 8.1 10*3/uL (ref 4.0–10.5)

## 2018-04-21 LAB — CBC WITH DIFFERENTIAL/PLATELET
Basophils Absolute: 0 10*3/uL (ref 0.0–0.2)
Basos: 0 %
EOS (ABSOLUTE): 0.2 10*3/uL (ref 0.0–0.4)
EOS: 3 %
HEMATOCRIT: 48.2 % (ref 37.5–51.0)
Hemoglobin: 16.6 g/dL (ref 13.0–17.7)
IMMATURE GRANULOCYTES: 0 %
Immature Grans (Abs): 0 10*3/uL (ref 0.0–0.1)
Lymphocytes Absolute: 1.7 10*3/uL (ref 0.7–3.1)
Lymphs: 30 %
MCH: 31.9 pg (ref 26.6–33.0)
MCHC: 34.4 g/dL (ref 31.5–35.7)
MCV: 93 fL (ref 79–97)
MONOS ABS: 0.5 10*3/uL (ref 0.1–0.9)
Monocytes: 9 %
NEUTROS PCT: 58 %
Neutrophils Absolute: 3.2 10*3/uL (ref 1.4–7.0)
PLATELETS: 170 10*3/uL (ref 150–450)
RBC: 5.21 x10E6/uL (ref 4.14–5.80)
RDW: 14.2 % (ref 12.3–15.4)
WBC: 5.6 10*3/uL (ref 3.4–10.8)

## 2018-04-21 LAB — ROCKY MTN SPOTTED FVR AB, IGM-BLOOD: RMSF IGM: 0.74 {index} (ref 0.00–0.89)

## 2018-04-21 LAB — SEDIMENTATION RATE: Sed Rate: 3 mm/hr (ref 0–15)

## 2018-04-21 MED ORDER — MAGNESIUM SULFATE 2 GM/50ML IV SOLN
2.0000 g | Freq: Once | INTRAVENOUS | Status: AC
  Administered 2018-04-21: 2 g via INTRAVENOUS
  Filled 2018-04-21: qty 50

## 2018-04-21 MED ORDER — DIPHENHYDRAMINE HCL 50 MG/ML IJ SOLN
25.0000 mg | Freq: Once | INTRAMUSCULAR | Status: AC
  Administered 2018-04-21: 25 mg via INTRAVENOUS
  Filled 2018-04-21: qty 1

## 2018-04-21 MED ORDER — DEXAMETHASONE SODIUM PHOSPHATE 4 MG/ML IJ SOLN
4.0000 mg | Freq: Once | INTRAMUSCULAR | Status: AC
  Administered 2018-04-21: 4 mg via INTRAVENOUS
  Filled 2018-04-21: qty 1

## 2018-04-21 MED ORDER — METOCLOPRAMIDE HCL 5 MG/ML IJ SOLN
10.0000 mg | Freq: Once | INTRAMUSCULAR | Status: AC
  Administered 2018-04-21: 10 mg via INTRAVENOUS
  Filled 2018-04-21: qty 2

## 2018-04-21 MED ORDER — LIDOCAINE HCL (PF) 1 % IJ SOLN
5.0000 mL | Freq: Once | INTRAMUSCULAR | Status: AC
  Administered 2018-04-21: 5 mL
  Filled 2018-04-21: qty 5

## 2018-04-21 MED ORDER — KETOROLAC TROMETHAMINE 15 MG/ML IJ SOLN
15.0000 mg | Freq: Once | INTRAMUSCULAR | Status: AC
  Administered 2018-04-21: 15 mg via INTRAVENOUS
  Filled 2018-04-21: qty 1

## 2018-04-21 MED ORDER — SODIUM CHLORIDE 0.9 % IV BOLUS
1000.0000 mL | Freq: Once | INTRAVENOUS | Status: AC
  Administered 2018-04-21: 1000 mL via INTRAVENOUS

## 2018-04-21 NOTE — Discharge Instructions (Addendum)
Follow with Primary MD Knapp, Eve, MD in 7 days  ? ?Get CBC, CMP,  checked  by Primary MD next visit.  ? ? ?Activity: As tolerated with Full fall precautions use walker/cane & assistance as needed ? ? ?Disposition Home  ? ? ?Diet: Heart Healthy ? ?On your next visit with your primary care physician please Get Medicines reviewed and adjusted. ? ? ?Please request your Prim.MD to go over all Hospital Tests and Procedure/Radiological results at the follow up, please get all Hospital records sent to your Prim MD by signing hospital release before you go home. ? ? ?If you experience worsening of your admission symptoms, develop shortness of breath, life threatening emergency, suicidal or homicidal thoughts you must seek medical attention immediately by calling 911 or calling your MD immediately  if symptoms less severe. ? ?You Must read complete instructions/literature along with all the possible adverse reactions/side effects for all the Medicines you take and that have been prescribed to you. Take any new Medicines after you have completely understood and accpet all the possible adverse reactions/side effects.  ? ?Do not drive, operating heavy machinery, perform activities at heights, swimming or participation in water activities or provide baby sitting services if your were admitted for syncope or siezures until you have seen by Primary MD or a Neurologist and advised to do so again. ? ?Do not drive when taking Pain medications.  ? ? ?Do not take more than prescribed Pain, Sleep and Anxiety Medications ? ?Special Instructions: If you have smoked or chewed Tobacco  in the last 2 yrs please stop smoking, stop any regular Alcohol  and or any Recreational drug use. ? ?Wear Seat belts while driving. ? ? ?Please note ? ?You were cared for by a hospitalist during your hospital stay. If you have any questions about your discharge medications or the care you received while you were in the hospital after you are discharged, you  can call the unit and asked to speak with the hospitalist on call if the hospitalist that took care of you is not available. Once you are discharged, your primary care physician will handle any further medical issues. Please note that NO REFILLS for any discharge medications will be authorized once you are discharged, as it is imperative that you return to your primary care physician (or establish a relationship with a primary care physician if you do not have one) for your aftercare needs so that they can reassess your need for medications and monitor your lab values.  ?

## 2018-04-21 NOTE — ED Notes (Signed)
Lumbar Puncture procedure explained to pt by Abigail Butts PA and consent signed by pt.

## 2018-04-21 NOTE — ED Provider Notes (Signed)
[facility] DEPARTMENT Provider Note   CSN: [id number] Arrival date & time: 04/21/18  1901     History   Chief Complaint Chief Complaint  Patient presents with  . Migraine    HPI Longino Trefz is a 47 y.o. male with a past medical history of colorblind, remote history of migraines who presents today for evaluation of headache since Sunday.  He has been seen by Dr. Rita Ohara, his PCP, in addition to a PA at the urgent care for this without significant relief.  Reports that his migraine headache started on Sunday, and that he is only been able to sleep for 2 nights due to the pain.  His wife corroborates and provides additional history.  He reports that his pain is primarily over the front of his head.  He reports mild photosensitivity.  When this started he initially had subjective fevers, wife states temperature was 101.  He reports that he felt generally like he had the flu and not well.  That has resolved.  He does report that his PCP tested him for [facility] spotted fever and started him on doxycycline, that test came back negative and he has stopped the doxycycline.  He and his wife deny any confusion,  numbness, or weakness.  HPI  Past Medical History:  Diagnosis Date  . Colorblind   . Dyslipidemia   . 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 Low back pain     Patient Active Problem List   Diagnosis Date Noted  . Left knee pain 12/08/2016  . Plantar fasciitis of left foot 12/29/2015  . 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 Pure hypercholesterolemia 06/23/2011    Past Surgical History:  Procedure Laterality Date  . CERVICAL DISC SURGERY  2009   JONES, MD; fusion  . LUMBAR DISC SURGERY  2000   CARTER, MD; L4-L5 (right)  . LUMBAR DISC SURGERY  11/2013   L4-5, Dr. Ronnald Ramp (left)  . VASECTOMY  11/09        Home Medications    Prior to Admission medications   Medication Sig Start Date End Date Taking? Authorizing Provider  atorvastatin (LIPITOR)  20 MG tablet TAKE 1 TABLET BY MOUTH DAILY. 09/28/17  Yes Rita Ohara, MD  doxycycline (VIBRA-TABS) 100 MG tablet Take 1 tablet (100 mg total) by mouth 2 (two) times daily. 04/19/18  Yes Rita Ohara, MD  SUMAtriptan (IMITREX) 100 MG tablet Take 1/2-1 tablet at onset of headache. May repeat in 2 hours if headache persists/recurs (max 200 mg/24 hrs) 04/20/18  Yes Rita Ohara, MD  zolpidem (AMBIEN) 10 MG tablet TAKE 1 TABLET BY MOUTH NIGHTLY AT BEDTIME AS NEEDED 10/24/17  Yes Rita Ohara, MD  ondansetron (ZOFRAN-ODT) 8 MG disintegrating tablet Take 1 tablet (8 mg total) by mouth every 8 (eight) hours as needed for nausea. Patient not taking: Reported on 04/19/2018 04/18/18   Jaynee Eagles, PA-C  valACYclovir (VALTREX) 500 MG tablet TAKE 1 TABLET BY MOUTH 2 TIMES DAILY. USE FOR TWICE DAILY FOR 3 DAYS AS NEEDED FOR OUTBREAKS Patient not taking: Reported on 04/19/2018 10/24/17   Rita Ohara, MD    Family History Family History  Problem Relation Age of Onset  . Heart disease Father        CABG in mid 64's  . Hypertension Father   . Alzheimer's disease Father   . Hyperlipidemia Father   . Diabetes Father   . Hyperlipidemia Mother   . Hyperlipidemia Sister   . Hyperlipidemia  Brother   . Hypertension Brother   . Hyperlipidemia Brother   . Hyperlipidemia Sister   . Melanoma Sister   . Breast cancer Sister 40  . Cancer Sister   . Hyperlipidemia Sister   . Breast cancer Sister 68  . Cancer Sister   . Hyperlipidemia Sister   . Hyperlipidemia Sister   . Hyperlipidemia Sister   . Hyperlipidemia Sister   . Asthma Son     Social History Social History   Tobacco Use  . Smoking status: Never Smoker  . Smokeless tobacco: Never Used  Substance Use Topics  . Alcohol use: Yes    Comment: 1 drink 3-4 times per week.  . Drug use: No     Allergies   Patient has no known allergies.   Review of Systems Review of Systems  Constitutional: Positive for fatigue and fever. Negative for chills.  HENT: Negative for  congestion.   Eyes: Positive for photophobia. Negative for visual disturbance.  Respiratory: Negative for chest tightness and shortness of breath.   Cardiovascular: Negative for chest pain, palpitations and leg swelling.  Gastrointestinal: Positive for nausea. Negative for abdominal pain, diarrhea and vomiting.  Musculoskeletal: Negative for back pain, neck pain and neck stiffness.  Skin: Negative for color change, rash and wound.  Allergic/Immunologic: Negative for immunocompromised state.  Neurological: Positive for dizziness (Intermittent. ) and headaches. Negative for tremors, syncope, speech difficulty, weakness, light-headedness and numbness.  Psychiatric/Behavioral: Negative for agitation, behavioral problems and decreased concentration. The patient is not nervous/anxious.   All other systems reviewed and are negative.    Physical Exam Updated Vital Signs BP (!) 133/97   Pulse 70   Temp 98.3 F (36.8 C) (Oral)   Resp 18   SpO2 96%   Physical Exam  Constitutional: He is oriented to person, place, and time. He appears well-developed and well-nourished. No distress.  HENT:  Head: Normocephalic and atraumatic.  Mouth/Throat: Oropharynx is clear and moist.  Eyes: Pupils are equal, round, and reactive to light. Conjunctivae and EOM are normal. Right eye exhibits no discharge. Left eye exhibits no discharge. No scleral icterus.  Neck: Normal range of motion. Neck supple.  Cardiovascular: Normal rate, regular rhythm, normal heart sounds and intact distal pulses.  No murmur heard. Pulmonary/Chest: Effort normal and breath sounds normal. No stridor. No respiratory distress.  Abdominal: Soft. Bowel sounds are normal. He exhibits no distension. There is no tenderness. There is no guarding.  Musculoskeletal: He exhibits no edema or deformity.  Neurological: He is alert and oriented to person, place, and time. He exhibits normal muscle tone.  Mental Status:  Alert, oriented, thought  content appropriate, able to give a coherent history. Speech fluent without evidence of aphasia. Able to follow 2 step commands without difficulty.  Cranial Nerves:  II:  Peripheral visual fields grossly normal, pupils equal, round, reactive to light III,IV, VI: ptosis not present, extra-ocular motions intact bilaterally  V,VII: smile symmetric, facial light touch sensation equal VIII: hearing grossly normal to voice  X: uvula elevates symmetrically  XI: bilateral shoulder shrug symmetric and strong XII: midline tongue extension without fassiculations Motor:  Normal tone. 5/5 in upper and lower extremities bilaterally including strong and equal grip strength and dorsiflexion/plantar flexion CV: distal pulses palpable throughout    Skin: Skin is warm and dry. He is not diaphoretic.  Psychiatric: He has a normal mood and affect. His behavior is normal.  Nursing note and vitals reviewed.    ED Treatments / Results  Labs (  all labs ordered are listed, but only abnormal results are displayed) Labs Reviewed  BASIC METABOLIC PANEL - Abnormal; Notable for the following components:      Result Value   Glucose, Bld 110 (*)    All other components within normal limits  CBC    EKG None  Radiology Ct Head Wo Contrast  Result Date: 04/21/2018 CLINICAL DATA:  Headache. EXAM: CT HEAD WITHOUT CONTRAST TECHNIQUE: Contiguous axial images were obtained from the base of the skull through the vertex without intravenous contrast. COMPARISON:  None. FINDINGS: Brain: There is no evidence for acute hemorrhage, hydrocephalus, mass lesion, or abnormal extra-axial fluid collection. No definite CT evidence for acute infarction. Vascular: No hyperdense vessel or unexpected calcification. Skull: Normal. Negative for fracture or focal lesion. Sinuses/Orbits: No acute finding. Other: None. IMPRESSION: Unremarkable CT evaluation of the brain. Electronically Signed   By: Misty Stanley M.D.   On: 04/21/2018 21:19     Procedures Procedures (including critical care time)  Medications Ordered in ED Medications  sodium chloride 0.9 % bolus 1,000 mL (1,000 mLs Intravenous New Bag/Given 04/21/18 2055)  ketorolac (TORADOL) 15 MG/ML injection 15 mg (15 mg Intravenous Given 04/21/18 2115)  metoCLOPramide (REGLAN) injection 10 mg (10 mg Intravenous Given 04/21/18 2119)  dexamethasone (DECADRON) injection 4 mg (4 mg Intravenous Given 04/21/18 2122)  diphenhydrAMINE (BENADRYL) injection 25 mg (25 mg Intravenous Given 04/21/18 2118)     Initial Impression / Assessment and Plan / ED Course  I have reviewed the triage vital signs and the nursing notes.  Pertinent labs & imaging results that were available during my care of the patient were reviewed by me and considered in my medical decision making (see chart for details).    Patient presents today for evaluation of migraine headache that has been going on for approximately 6 days.  He has been seen by his PCP and urgent care who have tried multiple interventions, however he has not had sustained relief.  Discussed with patient treatment and evaluation options.  Given that his headache has been intractable to IM treatments tried so far will order CT head.  We will also place IV, give IV fluids, Reglan, Decadron, Benadryl, and Toradol in addition to obtaining basic labs.   CT head without evidence of mass or other abnormalities.  CBC interpreted as no significant anemia or leukocytosis.  BMP with normal electrolytes.    Plan to re-evaluate patient around 2330.   At shift change care was transferred to [facility] who will follow pending studies, re-evaulate and determine disposition.     Final Clinical Impressions(s) / ED Diagnoses   Final diagnoses:  Bad headache    ED Discharge Orders    None       Ollen Gross 04/21/18 2238    Charlesetta Shanks, MD 04/22/18 [id number]

## 2018-04-21 NOTE — ED Triage Notes (Signed)
Pt has had migraine headache since Sunday. Has seen his physician twice this week and tried toradol, imitrex, and steroid injections with no relief. Pain is 10/10.  Nausea with no vomiting.  Light and sound sensitive.

## 2018-04-21 NOTE — ED Notes (Signed)
Pt to CT at this time.

## 2018-04-21 NOTE — ED Notes (Signed)
Pt returned from CT °

## 2018-04-21 NOTE — ED Notes (Signed)
Nurse starting IV and collecting labs. 

## 2018-04-21 NOTE — ED Provider Notes (Signed)
Care assumed from [facility], [place].  Please see her full H&P.  In short,  Larry Cruz is a 47 y.o. male presents for headache, persistent for the last 6 days.  Pt has seen his PCP, urgent care and now the ED for this.  Pt was tested for RMSF and started on Doxycycline .  His ESR, sent this week by PCP was normal.  Pt denies recent international travel, sick contacts, known immunocompromise.  He denies contact with birds or mold.   Physical Exam  BP (!) 133/97   Pulse 70   Temp 98.3 F (36.8 C) (Oral)   Resp 18   SpO2 96%   Physical Exam  Constitutional: He appears well-developed and well-nourished.  Pt appears uncomfortable in bed.  HENT:  Head: Normocephalic.  Eyes: Conjunctivae are normal. No scleral icterus.  Neck: Normal range of motion.  No nuchal rigidity.  Cardiovascular: Normal rate and intact distal pulses.  Pulmonary/Chest: Effort normal.  Musculoskeletal: Normal range of motion.  Neurological: He is alert.  Skin: Skin is warm and dry.  Nursing note and vitals reviewed.   ED Course/Procedures   Clinical Course as of Apr 23 311  Sat Apr 21, 2018  2215 Plan: meds and reassess.  Pt has been given Toradol, decadron, reglan, benadryl and fluids.     [HM]  2302 Pt reports no significant improvement in headache.  Mag ordered.    Concern for alternate etiology of headache.  Discussed potential LP with patient and wife who are amenable to this.    [HM]  Sun Apr 22, 2018  0015 No acute abnormality.  No ICH or mass noted.  I personally reviewed the images.  CT Head Wo Contrast [HM]  0100 Elevated  Total  Protein, CSF(!): 59 [HM]  0230 Elevated  WBC, CSF(!!): 11 [HM]  0258 Discussed with Dr. Maudie Mercury who will admit   [HM]    Clinical Course User Index [HM] Song Myre, Gwenlyn Perking    Results for orders placed or performed during the hospital encounter of 04/21/18  CSF culture  Result Value Ref Range   Specimen Description CSF    Special Requests TUBE 2     Gram Stain      CYTOSPIN SLIDE WBC PRESENT, PREDOMINANTLY MONONUCLEAR NO ORGANISMS SEEN Performed at [facility], [address] [place], [place] [address]    Culture PENDING    Report Status PENDING   Basic metabolic panel  Result Value Ref Range   Sodium 138 135 - 145 mmol/L   Potassium 4.4 3.5 - 5.1 mmol/L   Chloride 101 98 - 111 mmol/L   CO2 28 22 - 32 mmol/L   Glucose, Bld 110 (H) 70 - 99 mg/dL   BUN 16 6 - 20 mg/dL   Creatinine, Ser 1.23 0.61 - 1.24 mg/dL   Calcium 9.4 8.9 - 10.3 mg/dL   GFR calc non Af Amer >60 >60 mL/min   GFR calc Af Amer >60 >60 mL/min   Anion gap 9 5 - 15  CBC  Result Value Ref Range   WBC 8.1 4.0 - 10.5 K/uL   RBC 5.00 4.22 - 5.81 MIL/uL   Hemoglobin 15.7 13.0 - 17.0 g/dL   HCT 46.8 39.0 - 52.0 %   MCV 93.6 78.0 - 100.0 fL   MCH 31.4 26.0 - 34.0 pg   MCHC 33.5 30.0 - 36.0 g/dL   RDW 13.1 11.5 - 15.5 %   Platelets 181 150 - 400 K/uL  CSF cell count with differential collection tube #: 1  Result Value Ref Range   Tube # 1    Color, CSF COLORLESS COLORLESS   Appearance, CSF CLEAR CLEAR   Supernatant NOT INDICATED    RBC Count, CSF 15 (H) 0 /cu mm   WBC, CSF 12 (HH) 0 - 5 /cu mm   Lymphs, CSF 94 (H) 40 - 80 %   Monocyte-Macrophage-Spinal Fluid 6 (L) 15 - 45 %  CSF cell count with differential  Result Value Ref Range   Tube # 4    Color, CSF COLORLESS COLORLESS   Appearance, CSF CLEAR CLEAR   Supernatant NOT INDICATED    RBC Count, CSF 1 (H) 0 /cu mm   WBC, CSF 11 (HH) 0 - 5 /cu mm   Lymphs, CSF 97 (H) 40 - 80 %   Monocyte-Macrophage-Spinal Fluid 3 (L) 15 - 45 %  Protein and glucose, CSF  Result Value Ref Range   Glucose, CSF 59 40 - 70 mg/dL   Total  Protein, CSF 59 (H) 15 - 45 mg/dL  Herpes simplex virus (HSV), DNA by PCR Cerebrospinal Fluid  Result Value Ref Range   Specimen source hsv CSF    HSV 1 DNA PENDING    HSV 2 DNA PENDING   Cryptococcal antigen, CSF  Result Value Ref Range   Crypto Ag NEGATIVE NEGATIVE    Cryptococcal Ag Titer NOT INDICATED NOT INDICATED   Ct Head Wo Contrast  Result Date: 04/21/2018 CLINICAL DATA:  Headache. EXAM: CT HEAD WITHOUT CONTRAST TECHNIQUE: Contiguous axial images were obtained from the base of the skull through the vertex without intravenous contrast. COMPARISON:  None. FINDINGS: Brain: There is no evidence for acute hemorrhage, hydrocephalus, mass lesion, or abnormal extra-axial fluid collection. No definite CT evidence for acute infarction. Vascular: No hyperdense vessel or unexpected calcification. Skull: Normal. Negative for fracture or focal lesion. Sinuses/Orbits: No acute finding. Other: None. IMPRESSION: Unremarkable CT evaluation of the brain. Electronically Signed   By: Misty Stanley M.D.   On: 04/21/2018 21:19     Procedures  MDM    Presents with severe headache onset last week.  He does report URI symptoms and fevers at home earlier in the week but none since Wednesday.  CT scan was performed without evidence of acute abnormality including no evidence of intracranial hemorrhage or mass.  Patient continues to have headache despite multiple interventions here in the emergency department.  I am concerned about more serious causes including potential meningitis versus subacute subarachnoid.  Patient will need lumbar puncture.  Risks and benefits discussed.  Patient and wife are amenable to plan for lumbar puncture.  CSF with increased protein and white blood cells.  Patient remains afebrile here in the emergency department.  Gram stain with no organisms noted.  Suspect this is viral meningitis.  Patient continues to have headache.  Will give narcotic for pain control.  Patient remains afebrile without tachycardia or hypotension.  No evidence of sepsis.  Discussed with Dr. Maudie Mercury who will admit.    Viral meningitis      Shawn Carattini, Gwenlyn Perking 04/22/18 8372    Charlesetta Shanks, MD 05/07/18 [phone]

## 2018-04-22 ENCOUNTER — Encounter (HOSPITAL_COMMUNITY): Payer: Self-pay | Admitting: Internal Medicine

## 2018-04-22 ENCOUNTER — Other Ambulatory Visit: Payer: Self-pay

## 2018-04-22 DIAGNOSIS — A879 Viral meningitis, unspecified: Secondary | ICD-10-CM | POA: Diagnosis not present

## 2018-04-22 DIAGNOSIS — B009 Herpesviral infection, unspecified: Secondary | ICD-10-CM | POA: Diagnosis not present

## 2018-04-22 DIAGNOSIS — M545 Low back pain: Secondary | ICD-10-CM | POA: Diagnosis not present

## 2018-04-22 DIAGNOSIS — Z79899 Other long term (current) drug therapy: Secondary | ICD-10-CM | POA: Diagnosis not present

## 2018-04-22 DIAGNOSIS — E785 Hyperlipidemia, unspecified: Secondary | ICD-10-CM | POA: Diagnosis not present

## 2018-04-22 LAB — CSF CELL COUNT WITH DIFFERENTIAL
Lymphs, CSF: 94 % — ABNORMAL HIGH (ref 40–80)
Lymphs, CSF: 97 % — ABNORMAL HIGH (ref 40–80)
Monocyte-Macrophage-Spinal Fluid: 6 % — ABNORMAL LOW (ref 15–45)
Monocyte-Macrophage-Spinal Fluid: 3 % — ABNORMAL LOW (ref 15–45)
RBC Count, CSF: 15 /mm3 — ABNORMAL HIGH
RBC Count, CSF: 1 /mm3 — ABNORMAL HIGH
Tube #: 1
Tube #: 4
WBC, CSF: 12 /mm3 (ref 0–5)
WBC, CSF: 11 /mm3 (ref 0–5)

## 2018-04-22 LAB — PROTEIN AND GLUCOSE, CSF
Glucose, CSF: 59 mg/dL (ref 40–70)
Total  Protein, CSF: 59 mg/dL — ABNORMAL HIGH (ref 15–45)

## 2018-04-22 LAB — HIV ANTIBODY (ROUTINE TESTING W REFLEX): HIV Screen 4th Generation wRfx: NONREACTIVE

## 2018-04-22 LAB — CRYPTOCOCCAL ANTIGEN, CSF: Crypto Ag: NEGATIVE

## 2018-04-22 MED ORDER — DEXTROSE 5 % IV SOLN
800.0000 mg | Freq: Three times a day (TID) | INTRAVENOUS | Status: DC
  Administered 2018-04-22 (×2): 800 mg via INTRAVENOUS
  Filled 2018-04-22 (×5): qty 16

## 2018-04-22 MED ORDER — ACETAMINOPHEN 325 MG PO TABS: 650.0000 mg | ORAL_TABLET | Freq: Four times a day (QID) | ORAL | Status: DC | PRN

## 2018-04-22 MED ORDER — MORPHINE SULFATE (PF) 4 MG/ML IV SOLN
4.0000 mg | Freq: Once | INTRAVENOUS | Status: AC
  Administered 2018-04-22: 4 mg via INTRAVENOUS
  Filled 2018-04-22: qty 1

## 2018-04-22 MED ORDER — ACETAMINOPHEN 650 MG RE SUPP: 650.0000 mg | Freq: Four times a day (QID) | RECTAL | Status: DC | PRN

## 2018-04-22 MED ORDER — HYDROMORPHONE HCL 1 MG/ML IJ SOLN
0.5000 mg | INTRAMUSCULAR | Status: DC | PRN
  Administered 2018-04-22 – 2018-04-23 (×4): 0.5 mg via INTRAVENOUS
  Filled 2018-04-22 (×4): qty 0.5

## 2018-04-22 MED ORDER — TRAMADOL HCL 50 MG PO TABS
50.0000 mg | ORAL_TABLET | Freq: Four times a day (QID) | ORAL | Status: DC | PRN
  Administered 2018-04-22 – 2018-04-23 (×4): 50 mg via ORAL
  Filled 2018-04-22 (×4): qty 1

## 2018-04-22 MED ORDER — ZOLPIDEM TARTRATE 5 MG PO TABS
5.0000 mg | ORAL_TABLET | Freq: Every evening | ORAL | Status: DC | PRN
  Administered 2018-04-22: 5 mg via ORAL
  Filled 2018-04-22: qty 1

## 2018-04-22 MED ORDER — ONDANSETRON HCL 4 MG/2ML IJ SOLN: 4.0000 mg | Freq: Four times a day (QID) | INTRAMUSCULAR | Status: DC | PRN

## 2018-04-22 MED ORDER — ENOXAPARIN SODIUM 40 MG/0.4ML ~~LOC~~ SOLN: 40.0000 mg | SUBCUTANEOUS | Status: DC

## 2018-04-22 MED ORDER — ATORVASTATIN CALCIUM 20 MG PO TABS
20.0000 mg | ORAL_TABLET | Freq: Every day | ORAL | Status: DC
  Administered 2018-04-22 – 2018-04-23 (×2): 20 mg via ORAL
  Filled 2018-04-22 (×2): qty 1

## 2018-04-22 MED ORDER — SODIUM CHLORIDE 0.9 % IV SOLN
INTRAVENOUS | Status: AC
  Administered 2018-04-22 (×2): via INTRAVENOUS

## 2018-04-22 MED ORDER — DEXTROSE 5 % IV SOLN
1000.0000 mg | Freq: Three times a day (TID) | INTRAVENOUS | Status: DC
  Administered 2018-04-22: 1000 mg via INTRAVENOUS
  Filled 2018-04-22 (×2): qty 20

## 2018-04-22 NOTE — Progress Notes (Signed)
Pharmacy Antibiotic Note  Larry Cruz is a 47 y.o. 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  Pharmacy has been consulted for Acyclovir dosing.  Plan: Acyclovir 1000 mg IV q8h     Temp (24hrs), Avg:98.3 F (36.8 C), Min:98.3 F (36.8 C), Max:98.3 F (36.8 C)  Recent Labs  Lab 04/19/18 1551 04/21/18 2055  WBC 5.6 8.1  CREATININE  --  1.23    Estimated Creatinine Clearance: 92 mL/min (by C-G formula based on SCr of 1.23 mg/dL).    No Known Allergies  Caryl Pina 04/22/2018 3:47 AM

## 2018-04-22 NOTE — Progress Notes (Signed)
PROGRESS NOTE                                                                                                                                                                                                             Patient Demographics:    Larry Cruz, is a 47 y.o. male, DOB - 1970/10/24, PJA:[id number]  Admit date - 04/21/2018   Admitting Physician Jani Gravel, MD  Outpatient Primary MD for the patient is Rita Ohara, MD  LOS - 0   Chief Complaint  Patient presents with  . Migraine       Brief Narrative   This is a no charge note as patient is admitted earlier today  47 y.o. male, w hyperlipidemia, h/o herpes genitalis c/o headache for the past 1 week. Started Sunday.  Reports some photophobia, CT head with no acute findings, LP was significant for white blood cells, predominantly lymphocytes, admitted for viral meningitis .      Subjective:    Larry Cruz today some headache and photophobia .    Assessment  & Plan :    Principal Problem:   Viral meningitis  Viral meningitis -Patient presents with headache, photophobia, LP significant for 12 white blood cells, predominantly lymphocytes, he is afebrile, recent RMSF titer 04/19/2018 was negative, CSF Gram stain is negative, cultures as well, medical suspicion for viral meningitis, HSV PCR is pending, empirically on IV acyclovir, continue with IV fluids.  Hyperlipidemia -Continue atorvastatin    Code Status : Full  Family Communication  : none at bedside  Disposition Plan  : Home  Consults  :  None  Procedures  : LP in ED  DVT Prophylaxis  : Encouraged to ambulate  Lab Results  Component Value Date   PLT 181 04/21/2018    Antibiotics  :    Anti-infectives (From admission, onward)   Start     Dose/Rate Route Frequency Ordered Stop   04/22/18 1400  acyclovir (ZOVIRAX) 800 mg in dextrose 5 % 150 mL IVPB     800 mg 166 mL/hr over 60 Minutes Intravenous Every 8 hours  04/22/18 1219     08 /04/19 0430  acyclovir (ZOVIRAX) 1,000 mg in dextrose 5 % 150 mL IVPB  Status:  Discontinued     1,000 mg 170 mL/hr over 60 Minutes Intravenous Every 8 hours 04/22/18 0347 04/22/18  1219        Objective:   Vitals:   04/22/18 0230 04/22/18 0300 04/22/18 0349 04/22/18 1305  BP: 135/90 133/86 132/86 112/63  Pulse: 68 67 63 (!) 58  Resp: 16  17 16   Temp:   (!) 97.4 F (36.3 C) 98.1 F (36.7 C)  TempSrc:   Oral Oral  SpO2: 98% 99% 99% 99%    Wt Readings from Last 3 Encounters:  04/19/18 101.6 kg (224 lb)  04/09/18 99.8 kg (220 lb)  05/10/17 99.1 kg (218 lb 6.4 oz)     Intake/Output Summary (Last 24 hours) at 04/22/2018 1400 Last data filed at 04/22/2018 0500 Gross per 24 hour  Intake 1086.01 ml  Output -  Net 1086.01 ml     Physical Exam  Awake Alert, Oriented X 3, No new F.N deficits, Normal affect Symmetrical Chest wall movement, Good air movement bilaterally, CTAB RRR,No Gallops,Rubs or new Murmurs, No Parasternal Heave +ve B.Sounds, Abd Soft, No tenderness, No rebound - guarding or rigidity. No Cyanosis, Clubbing or edema, No new Rash or bruise      Data Review:    CBC Recent Labs  Lab 04/19/18 1551 04/21/18 2055  WBC 5.6 8.1  HGB 16.6 15.7  HCT 48.2 46.8  PLT 170 181  MCV 93 93.6  MCH 31.9 31.4  MCHC 34.4 33.5  RDW 14.2 13.1  LYMPHSABS 1.7  --   EOSABS 0.2  --   BASOSABS 0.0  --     Chemistries  Recent Labs  Lab 04/21/18 2055  NA 138  K 4.4  CL 101  CO2 28  GLUCOSE 110*  BUN 16  CREATININE 1.23  CALCIUM 9.4   ------------------------------------------------------------------------------------------------------------------ No results for input(s): CHOL, HDL, LDLCALC, TRIG, CHOLHDL, LDLDIRECT in the last 72 hours.  No results found for: HGBA1C ------------------------------------------------------------------------------------------------------------------ No results for input(s): TSH, T4TOTAL, T3FREE, THYROIDAB in the  last 72 hours.  Invalid input(s): FREET3 ------------------------------------------------------------------------------------------------------------------ No results for input(s): VITAMINB12, FOLATE, FERRITIN, TIBC, IRON, RETICCTPCT in the last 72 hours.  Coagulation profile No results for input(s): INR, PROTIME in the last 168 hours.  No results for input(s): DDIMER in the last 72 hours.  Cardiac Enzymes No results for input(s): CKMB, TROPONINI, MYOGLOBIN in the last 168 hours.  Invalid input(s): CK ------------------------------------------------------------------------------------------------------------------ No results found for: BNP  Inpatient Medications  Scheduled Meds: . atorvastatin  20 mg Oral Daily  . enoxaparin (LOVENOX) injection  40 mg Subcutaneous Q24H   Continuous Infusions: . sodium chloride 75 mL/hr at 04/22/18 1321  . acyclovir 800 mg (04/22/18 1322)   PRN Meds:.acetaminophen **OR** acetaminophen, HYDROmorphone (DILAUDID) injection, ondansetron (ZOFRAN) IV, traMADol, zolpidem  Micro Results Recent Results (from the past 240 hour(s))  CSF culture     Status: None (Preliminary result)   Collection Time: 04/22/18 12:05 AM  Result Value Ref Range Status   Specimen Description CSF  Final   Special Requests TUBE 2  Final   Gram Stain   Final    CYTOSPIN SLIDE WBC PRESENT, PREDOMINANTLY MONONUCLEAR NO ORGANISMS SEEN Performed at [facility], [place] [address], [place], [place] [address]    Culture PENDING  Incomplete   Report Status PENDING  Incomplete    Radiology Reports Ct Head Wo Contrast  Result Date: 04/21/2018 CLINICAL DATA:  Headache. EXAM: CT HEAD WITHOUT CONTRAST TECHNIQUE: Contiguous axial images were obtained from the base of the skull through the vertex without intravenous contrast. COMPARISON:  None. FINDINGS: Brain: There is no evidence for acute  hemorrhage, hydrocephalus, mass lesion, or abnormal extra-axial fluid collection. No  definite CT evidence for acute infarction. Vascular: No hyperdense vessel or unexpected calcification. Skull: Normal. Negative for fracture or focal lesion. Sinuses/Orbits: No acute finding. Other: None. IMPRESSION: Unremarkable CT evaluation of the brain. Electronically Signed   By: Misty Stanley M.D.   On: 04/21/2018 21:19     Phillips Climes M.D on 04/22/2018 at 2:00 PM  Between 7am to 7pm - Pager - [phone]  After 7pm go to [email or web address] - password [facility]  [facility] -  Office  [phone]

## 2018-04-22 NOTE — ED Notes (Signed)
Lumbar puncture complete, pt tolerated well

## 2018-04-22 NOTE — ED Provider Notes (Signed)
  Physical Exam  BP (!) 133/97   Pulse 70   Temp 98.3 F (36.8 C) (Oral)   Resp 18   SpO2 96%   Physical Exam  ED Course/Procedures   Clinical Course as of Apr 22 8  Sat Apr 21, 2018  2215 Plan: meds and reassess.  Pt has been given Toradol, decadron, reglan, benadryl and fluids.     [HM]  2302 Pt reports no significant improvement in headache.  Mag ordered.    Concern for alternate etiology of headache.  Discussed potential LP with patient and wife who are amenable to this.    [HM]    Clinical Course User Index [HM] Muthersbaugh, Gwenlyn Perking    .Lumbar Puncture Date/Time: 04/22/2018 12:10 AM Performed by: Charlesetta Shanks, MD Authorized by: Charlesetta Shanks, MD   Consent:    Consent obtained:  Written   Consent given by:  Patient   Risks discussed:  Bleeding, headache, nerve damage, infection, pain and repeat procedure   Alternatives discussed:  No treatment and delayed treatment Pre-procedure details:    Procedure purpose:  Diagnostic   Preparation: Patient was prepped and draped in usual sterile fashion   Anesthesia (see MAR for exact dosages):    Anesthesia method:  Local infiltration   Local anesthetic:  Lidocaine 1% w/o epi Procedure details:    Lumbar space:  L3-L4 interspace   Patient position:  Sitting   Needle gauge:  20   Needle type:  Spinal needle - Quincke tip   Needle length (in):  3.5   Ultrasound guidance: no     Number of attempts:  1   Fluid appearance:  Clear   Tubes of fluid:  4 Post-procedure:    Puncture site:  Adhesive bandage applied   Patient tolerance of procedure:  Tolerated well, no immediate complications    MDM  Patient is alert and nontoxic.  Agree with review of spinal fluid for final disposition.  If within normal limits plan for continued treatment for migraine.      Charlesetta Shanks, MD 04/22/18 [phone]

## 2018-04-22 NOTE — H&P (Signed)
TRH H&P   Patient Demographics:    Larry Cruz, is a 47 y.o. male  MRN: [id number]   DOB - [birth year]  Admit Date - 04/21/2018  Outpatient Primary MD for the patient is Rita Ohara, MD  Referring MD/NP/PA:   Charlesetta Shanks  Outpatient Specialists:    Patient coming from: home  Chief Complaint  Patient presents with  . Migraine      HPI:    Larry Cruz  is a 47 y.o. male, w hyperlipidemia, h/o herpes genitalis c/o headache for the past 1 week. Started Sunday. Pt notes that he and his wife had some ? Viral illness 1 week ago. He was also hugging about 90 people the Saturday prior.  Pt notes that he has been on a steroid taper and had a elbow injection of cortisone as well.  The steroid taper finished 1 week ago.  Pt also notes taking ibuprofen otc.  Pt denies tick bite, mosquito bite but does work outdoors from time to time in his yard. Pt notes photophobia, slight neck stiffness but denies n/v or  significant fever. Pt denies rash , cp, palp, sob, diarrhea, brbpr, dysuria. Pt notes just completed 1 week course of doxycycline which was stopped due to his RMSF titer negative.   In ED, CT brain  Unremarkable CT of the brain  Na 138, K 4.4, Bun 16, Creatinine 1.23 Wbc 8.1, Hgb 15.7, Plt 181  LP Tube 1 Wbc 12, rbc 15 Lymph 94%, monocyte 6  Tube 4 Wbc 11, Rbc 1 Lymph 97%, mono 3  Protein 59, Glucose 59 Cryptococcal antigen negative  HSV pcr pending VDRL pending  Gram stain, negative  WBC PRESENT, PREDOMINANTLY MONONUCLEAR  NO ORGANISMS SEEN   Pt will be admitted for presumed viral meningitis        Review of systems:    In addition to the HPI above,  No Fever-chills, No changes with Vision or hearing, No problems swallowing food or Liquids, No Chest pain, Cough or Shortness of Breath, No Abdominal pain, No Nausea or Vommitting, Bowel movements are  regular, No Blood in stool or Urine, No dysuria, No new skin rashes or bruises, No new joints pains-aches,  No new weakness, tingling, numbness in any extremity, No recent weight gain or loss, No polyuria, polydypsia or polyphagia, No significant Mental Stressors.  A full 10 point Review of Systems was done, except as stated above, all other Review of Systems were negative.   With Past History of the following :    Past Medical History:  Diagnosis Date  . Colorblind   . Dyslipidemia   . 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 Low back pain       Past Surgical History:  Procedure Laterality Date  . CERVICAL DISC SURGERY  2009   JONES, MD; fusion  . LUMBAR DISC SURGERY  Marsh Dolly, MD;  L4-L5 (right)  . LUMBAR DISC SURGERY  11/2013   L4-5, Dr. Ronnald Ramp (left)  . VASECTOMY  11/09      Social History:     Social History   Tobacco Use  . Smoking status: Never Smoker  . Smokeless tobacco: Never Used  Substance Use Topics  . Alcohol use: Yes    Comment: 1 drink 3-4 times per week.     Lives - at home  Mobility - walks by self   Family History :     Family History  Problem Relation Age of Onset  . Heart disease Father        CABG in mid 36's  . Hypertension Father   . Alzheimer's disease Father   . Hyperlipidemia Father   . Diabetes Father   . Hyperlipidemia Mother   . Hyperlipidemia Sister   . Hyperlipidemia Brother   . Hypertension Brother   . Hyperlipidemia Brother   . Hyperlipidemia Sister   . Melanoma Sister   . Breast cancer Sister 77  . Cancer Sister   . Hyperlipidemia Sister   . Breast cancer Sister 57  . Cancer Sister   . Hyperlipidemia Sister   . Hyperlipidemia Sister   . Hyperlipidemia Sister   . Hyperlipidemia Sister   . Asthma Son       Home Medications:   Prior to Admission medications   Medication Sig Start Date End Date Taking? Authorizing Provider  atorvastatin (LIPITOR) 20 MG tablet TAKE 1 TABLET BY MOUTH DAILY. 09/28/17  Yes Rita Ohara, MD  doxycycline (VIBRA-TABS) 100 MG tablet Take 1 tablet (100 mg total) by mouth 2 (two) times daily. 04/19/18  Yes Rita Ohara, MD  SUMAtriptan (IMITREX) 100 MG tablet Take 1/2-1 tablet at onset of headache. May repeat in 2 hours if headache persists/recurs (max 200 mg/24 hrs) 04/20/18  Yes Rita Ohara, MD  zolpidem (AMBIEN) 10 MG tablet TAKE 1 TABLET BY MOUTH NIGHTLY AT BEDTIME AS NEEDED 10/24/17  Yes Rita Ohara, MD  ondansetron (ZOFRAN-ODT) 8 MG disintegrating tablet Take 1 tablet (8 mg total) by mouth every 8 (eight) hours as needed for nausea. Patient not taking: Reported on 04/19/2018 04/18/18   Jaynee Eagles, PA-C  valACYclovir (VALTREX) 500 MG tablet TAKE 1 TABLET BY MOUTH 2 TIMES DAILY. USE FOR TWICE DAILY FOR 3 DAYS AS NEEDED FOR OUTBREAKS Patient not taking: Reported on 04/19/2018 10/24/17   Rita Ohara, MD     Allergies:    No Known Allergies   Physical Exam:   Vitals  Blood pressure 132/86, pulse 63, temperature (!) 97.4 F (36.3 C), temperature source Oral, resp. rate 17, SpO2 99 %.   1. General lying in bed in NAD,   2. Normal affect and insight, Not Suicidal or Homicidal, Awake Alert, Oriented X 3.  3. No F.N deficits, ALL C.Nerves Intact, Strength 5/5 all 4 extremities, Sensation intact all 4 extremities, Plantars down going.  4. Ears and Eyes appear Normal, Conjunctivae clear, PERRLA. Moist Oral Mucosa.  5. Supple Neck, No JVD, No cervical lymphadenopathy appriciated, No Carotid Bruits.  6. Symmetrical Chest wall movement, Good air movement bilaterally, CTAB.  7. RRR, No Gallops, Rubs or Murmurs, No Parasternal Heave.  8. Positive Bowel Sounds, Abdomen Soft, No tenderness, No organomegaly appriciated,No rebound -guarding or rigidity.  9.  No Cyanosis, Normal Skin Turgor, No Skin Rash or Bruise.  10. Good muscle tone,  joints appear normal , no effusions, Normal ROM.  11. No Palpable Lymph Nodes in Neck or  Axillae No nuchal rigidity  Negative kernig, negative  Brudzinski   Data Review:    CBC Recent Labs  Lab 04/19/18 1551 04/21/18 2055  WBC 5.6 8.1  HGB 16.6 15.7  HCT 48.2 46.8  PLT 170 181  MCV 93 93.6  MCH 31.9 31.4  MCHC 34.4 33.5  RDW 14.2 13.1  LYMPHSABS 1.7  --   EOSABS 0.2  --   BASOSABS 0.0  --    ------------------------------------------------------------------------------------------------------------------  Chemistries  Recent Labs  Lab 04/21/18 2055  NA 138  K 4.4  CL 101  CO2 28  GLUCOSE 110*  BUN 16  CREATININE 1.23  CALCIUM 9.4   ------------------------------------------------------------------------------------------------------------------ estimated creatinine clearance is 92 mL/min (by C-G formula based on SCr of 1.23 mg/dL). ------------------------------------------------------------------------------------------------------------------ No results for input(s): TSH, T4TOTAL, T3FREE, THYROIDAB in the last 72 hours.  Invalid input(s): FREET3  Coagulation profile No results for input(s): INR, PROTIME in the last 168 hours. ------------------------------------------------------------------------------------------------------------------- No results for input(s): DDIMER in the last 72 hours. -------------------------------------------------------------------------------------------------------------------  Cardiac Enzymes No results for input(s): CKMB, TROPONINI, MYOGLOBIN in the last 168 hours.  Invalid input(s): CK ------------------------------------------------------------------------------------------------------------------ No results found for: BNP   ---------------------------------------------------------------------------------------------------------------  Urinalysis    Component Value Date/Time   LABSPEC 1.015 05/10/2017 0854   BILIRUBINUR negative 05/10/2017 0854   BILIRUBINUR neg 05/04/2016 1008   KETONESUR negative 05/10/2017 0854   PROTEINUR =30 (A) 05/10/2017 0854    PROTEINUR neg 05/04/2016 1008   UROBILINOGEN negative 05/04/2016 1008   NITRITE Negative 05/10/2017 0854   NITRITE neg 05/04/2016 1008   LEUKOCYTESUR Negative 05/10/2017 0854    ----------------------------------------------------------------------------------------------------------------   Imaging Results:    Ct Head Wo Contrast  Result Date: 04/21/2018 CLINICAL DATA:  Headache. EXAM: CT HEAD WITHOUT CONTRAST TECHNIQUE: Contiguous axial images were obtained from the base of the skull through the vertex without intravenous contrast. COMPARISON:  None. FINDINGS: Brain: There is no evidence for acute hemorrhage, hydrocephalus, mass lesion, or abnormal extra-axial fluid collection. No definite CT evidence for acute infarction. Vascular: No hyperdense vessel or unexpected calcification. Skull: Normal. Negative for fracture or focal lesion. Sinuses/Orbits: No acute finding. Other: None. IMPRESSION: Unremarkable CT evaluation of the brain. Electronically Signed   By: Misty Stanley M.D.   On: 04/21/2018 21:19       Assessment & Plan:    Principal Problem:   Viral meningitis    Headache, Viral meningitis  ESR 3 RMSF titer 8/1 negative Acyclovir pharmacy to dose Hsv pcr pending [place] pending [place] pending VDRL pending  Hyperlipidemia Cont Atorvastatin   DVT Prophylaxis Lovenox - SCDs   AM Labs Ordered, also please review Full Orders  Family Communication: Admission, patients condition and plan of care including tests being ordered have been discussed with the patient  who indicate understanding and agree with the plan and Code Status.  Code Status  FULL CDOE  Likely DC to  home  Condition GUARDED    Consults called: none  Admission status: observation   Time spent in minutes : 60   Jani Gravel M.D on 04/22/2018 at 3:58 AM  Between 7am to 7pm - Pager - [phone]   After 7pm go to [email or web address] - password [facility]  [facility] - Office  [phone]

## 2018-04-23 DIAGNOSIS — G030 Nonpyogenic meningitis: Secondary | ICD-10-CM | POA: Diagnosis not present

## 2018-04-23 DIAGNOSIS — E785 Hyperlipidemia, unspecified: Secondary | ICD-10-CM | POA: Diagnosis not present

## 2018-04-23 DIAGNOSIS — B009 Herpesviral infection, unspecified: Secondary | ICD-10-CM | POA: Diagnosis not present

## 2018-04-23 DIAGNOSIS — M545 Low back pain: Secondary | ICD-10-CM | POA: Diagnosis not present

## 2018-04-23 DIAGNOSIS — A879 Viral meningitis, unspecified: Secondary | ICD-10-CM | POA: Diagnosis not present

## 2018-04-23 DIAGNOSIS — Z79899 Other long term (current) drug therapy: Secondary | ICD-10-CM | POA: Diagnosis not present

## 2018-04-23 LAB — CBC
HEMATOCRIT: 41.9 % (ref 39.0–52.0)
HEMOGLOBIN: 14.1 g/dL (ref 13.0–17.0)
MCH: 31.5 pg (ref 26.0–34.0)
MCHC: 33.7 g/dL (ref 30.0–36.0)
MCV: 93.5 fL (ref 78.0–100.0)
Platelets: 189 10*3/uL (ref 150–400)
RBC: 4.48 MIL/uL (ref 4.22–5.81)
RDW: 12.9 % (ref 11.5–15.5)
WBC: 8.9 10*3/uL (ref 4.0–10.5)

## 2018-04-23 LAB — COMPREHENSIVE METABOLIC PANEL
ALBUMIN: 3.5 g/dL (ref 3.5–5.0)
ALK PHOS: 52 U/L (ref 38–126)
ALT: 64 U/L — ABNORMAL HIGH (ref 0–44)
ANION GAP: 8 (ref 5–15)
AST: 41 U/L (ref 15–41)
BUN: 14 mg/dL (ref 6–20)
CALCIUM: 8.9 mg/dL (ref 8.9–10.3)
CO2: 26 mmol/L (ref 22–32)
Chloride: 103 mmol/L (ref 98–111)
Creatinine, Ser: 1.14 mg/dL (ref 0.61–1.24)
GLUCOSE: 96 mg/dL (ref 70–99)
POTASSIUM: 4.3 mmol/L (ref 3.5–5.1)
Sodium: 137 mmol/L (ref 135–145)
TOTAL PROTEIN: 6.4 g/dL — AB (ref 6.5–8.1)
Total Bilirubin: 1.1 mg/dL (ref 0.3–1.2)

## 2018-04-23 LAB — HSV DNA BY PCR (REFERENCE LAB): HSV 1 DNA: NEGATIVE

## 2018-04-23 LAB — VDRL, CSF: VDRL Quant, CSF: NONREACTIVE

## 2018-04-23 LAB — HERPES SIMPLEX VIRUS(HSV) DNA BY PCR: HSV 2 DNA: NEGATIVE

## 2018-04-23 MED ORDER — BUTALBITAL-APAP-CAFFEINE 50-325-40 MG PO TABS: 1.0000 | ORAL_TABLET | Freq: Four times a day (QID) | ORAL | 0 refills | Status: DC | PRN

## 2018-04-23 MED ORDER — SODIUM CHLORIDE 0.9 % IV SOLN
Freq: Once | INTRAVENOUS | Status: AC
  Administered 2018-04-23: 1000 mL via INTRAVENOUS

## 2018-04-23 MED ORDER — BUTALBITAL-APAP-CAFFEINE 50-325-40 MG PO TABS: 1.0000 | ORAL_TABLET | ORAL | Status: DC | PRN

## 2018-04-23 MED FILL — BUTALB-ACETAMIN-CAFF 50-325: 50-325-40 | 4 days supply | Qty: 14 | Fill #0

## 2018-04-23 NOTE — Discharge Summary (Signed)
Larry Cruz, is a 47 y.o. male  DOB 22-Sep-1970  MRN [id number].  Admission date:  04/21/2018  Admitting Physician  Jani Gravel, MD  Discharge Date:  04/23/2018   Primary MD  Rita Ohara, MD  Recommendations for primary care physician for things to follow:  -Please check CBC, BMP during next visit  Admission Diagnosis  Aseptic meningitis  Discharge Diagnosis    Principal Problem:   Aseptic meningitis      Past Medical History:  Diagnosis Date  . Colorblind   . Dyslipidemia   . 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 Low back pain     Past Surgical History:  Procedure Laterality Date  . CERVICAL DISC SURGERY  2009   JONES, MD; fusion  . LUMBAR DISC SURGERY  2000   CARTER, MD; L4-L5 (right)  . LUMBAR DISC SURGERY  11/2013   L4-5, Dr. Ronnald Ramp (left)  . VASECTOMY  11/09       History of present illness and  Hospital Course:     Kindly see H&P for history of present illness and admission details, please review complete Labs, Consult reports and Test reports for all details in brief  HPI  from the history and physical done on the day of admission 04/22/2018  Larry Cruz  is a 47 y.o. male, w hyperlipidemia, h/o herpes genitalis c/o headache for the past 1 week. Started Sunday. Pt notes that he and his wife had some ? Viral illness 1 week ago. He was also hugging about 90 people the Saturday prior.  Pt notes that he has been on a steroid taper and had a elbow injection of cortisone as well.  The steroid taper finished 1 week ago.  Pt also notes taking ibuprofen otc.  Pt denies tick bite, mosquito bite but does work outdoors from time to time in his yard. Pt notes photophobia, slight neck stiffness but denies n/v or  significant fever. Pt denies rash , cp, palp, sob, diarrhea, brbpr, dysuria. Pt notes just completed 1 week course of doxycycline which was stopped due to his RMSF titer negative.   In ED, CT  brain  Unremarkable CT of the brain  Na 138, K 4.4, Bun 16, Creatinine 1.23 Wbc 8.1, Hgb 15.7, Plt 181  LP Tube 1 Wbc 12, rbc 15 Lymph 94%, monocyte 6  Tube 4 Wbc 11, Rbc 1 Lymph 97%, mono 3  Protein 59, Glucose 59 Cryptococcal antigen negative  HSV pcr pending VDRL pending  Gram stain, negative  WBC PRESENT, PREDOMINANTLY MONONUCLEAR  NO ORGANISMS SEEN   Pt will be admitted for presumed viral meningitis    Hospital Course   46 y.o.male,w hyperlipidemia, h/o herpes genitalis c/o headache for the past 1 week. Started Sunday.  Reports some photophobia, CT head with no acute findings, LP was significant for white blood cells, predominantly lymphocytes, admitted for aseptic meningitis .   Aseptic meningitis -Patient presents with headache, photophobia, LP significant for 12 white blood cells, predominantly lymphocytes, he is afebrile, recent RMSF titer  04/19/2018 was negative, CSF Gram stain is negative, cultures as well, he was empirically on IV acyclovir for presumed HSV meningitis, as well kept on IV hydration while on IV acyclovir, his HSV PCR came back negative at day of discharge, so he will not be discharged on any antiviral medication, he was instructed to increase his fluid intake . -Headache significantly improved , will discharge on Fioricet .  Hyperlipidemia -Continue atorvastatin    Discharge Condition:  Stable   Follow UP  Follow-up Information    Rita Ohara, MD. Schedule an appointment as soon as possible for a visit today.   Specialty:  Family Medicine Contact information: [address] [place] [address] [phone]             Discharge Instructions  and  Discharge Medications    Discharge Instructions    Discharge instructions   Complete by:  As directed    Follow with Primary MD Rita Ohara, MD in 7 days   Get CBC, CMP,checked  by Primary MD next visit.    Activity: As tolerated with Full fall precautions  use walker/cane & assistance as needed   Disposition Home   Diet: Heart Healthy    On your next visit with your primary care physician please Get Medicines reviewed and adjusted.   Please request your Prim.MD to go over all Hospital Tests and Procedure/Radiological results at the follow up, please get all Hospital records sent to your Prim MD by signing hospital release before you go home.   If you experience worsening of your admission symptoms, develop shortness of breath, life threatening emergency, suicidal or homicidal thoughts you must seek medical attention immediately by calling 911 or calling your MD immediately  if symptoms less severe.  You Must read complete instructions/literature along with all the possible adverse reactions/side effects for all the Medicines you take and that have been prescribed to you. Take any new Medicines after you have completely understood and accpet all the possible adverse reactions/side effects.   Do not drive, operating heavy machinery, perform activities at heights, swimming or participation in water activities or provide baby sitting services if your were admitted for syncope or siezures until you have seen by Primary MD or a Neurologist and advised to do so again.  Do not drive when taking Pain medications.    Do not take more than prescribed Pain, Sleep and Anxiety Medications  Special Instructions: If you have smoked or chewed Tobacco  in the last 2 yrs please stop smoking, stop any regular Alcohol  and or any Recreational drug use.  Wear Seat belts while driving.   Please note  You were cared for by a hospitalist during your hospital stay. If you have any questions about your discharge medications or the care you received while you were in the hospital after you are discharged, you can call the unit and asked to speak with the hospitalist on call if the hospitalist that took care of you is not available. Once you are discharged, your  primary care physician will handle any further medical issues. Please note that NO REFILLS for any discharge medications will be authorized once you are discharged, as it is imperative that you return to your primary care physician (or establish a relationship with a primary care physician if you do not have one) for your aftercare needs so that they can reassess your need for medications and monitor your lab values.   Increase activity slowly   Complete by:  As directed      Allergies as of 04/23/2018   No Known Allergies     Medication List    STOP taking these medications   doxycycline 100 MG tablet Commonly known as:  VIBRA-TABS     TAKE these medications   atorvastatin 20 MG tablet Commonly known as:  LIPITOR TAKE 1 TABLET BY MOUTH DAILY.   butalbital-acetaminophen-caffeine 50-325-40 MG tablet Commonly known as:  FIORICET, ESGIC Take 1 tablet by mouth every 6 (six) hours as needed for headache.   ondansetron 8 MG disintegrating tablet Commonly known as:  ZOFRAN-ODT Take 1 tablet (8 mg total) by mouth every 8 (eight) hours as needed for nausea.   SUMAtriptan 100 MG tablet Commonly known as:  IMITREX Take 1/2-1 tablet at onset of headache. May repeat in 2 hours if headache persists/recurs (max 200 mg/24 hrs)   valACYclovir 500 MG tablet Commonly known as:  VALTREX TAKE 1 TABLET BY MOUTH 2 TIMES DAILY. USE FOR TWICE DAILY FOR 3 DAYS AS NEEDED FOR OUTBREAKS   zolpidem 10 MG tablet Commonly known as:  AMBIEN TAKE 1 TABLET BY MOUTH NIGHTLY AT BEDTIME AS NEEDED         Diet and Activity recommendation: See Discharge Instructions above   Consults obtained - None   Major procedures and Radiology Reports - PLEASE review detailed and final reports for all details, in brief -      Ct Head Wo Contrast  Result Date: 04/21/2018 CLINICAL DATA:  Headache. EXAM: CT HEAD WITHOUT CONTRAST TECHNIQUE: Contiguous axial images were obtained from the base of the skull through the  vertex without intravenous contrast. COMPARISON:  None. FINDINGS: Brain: There is no evidence for acute hemorrhage, hydrocephalus, mass lesion, or abnormal extra-axial fluid collection. No definite CT evidence for acute infarction. Vascular: No hyperdense vessel or unexpected calcification. Skull: Normal. Negative for fracture or focal lesion. Sinuses/Orbits: No acute finding. Other: None. IMPRESSION: Unremarkable CT evaluation of the brain. Electronically Signed   By: Misty Stanley M.D.   On: 04/21/2018 21:19    Micro Results     Recent Results (from the past 240 hour(s))  CSF culture     Status: None (Preliminary result)   Collection Time: 04/22/18 12:05 AM  Result Value Ref Range Status   Specimen Description CSF  Final   Special Requests TUBE 2  Final   Gram Stain   Final    CYTOSPIN SLIDE WBC PRESENT, PREDOMINANTLY MONONUCLEAR NO ORGANISMS SEEN    Culture   Final    NO GROWTH 1 DAY Performed at [facility], [address], [place], [place] [address]    Report Status PENDING  Incomplete       Today   Subjective:   Larry Cruz today reports his headache significantly improved, as well his photophobia significantly subsided, but in the hallway multiple times yesterday, no neck pain, no weakness, feels much better today .  Objective:   Blood pressure 119/72, pulse (!) 51, temperature 97.9 F (36.6 C), temperature source Oral, resp. rate 18, weight 100.6 kg (221 lb 12.5 oz), SpO2 96 %.   Intake/Output Summary (Last 24 hours) at 04/23/2018 1145 Last data filed at 04/23/2018 1113 Gross per 24 hour  Intake 1242.86 ml  Output -  Net 1242.86 ml    Exam Awake Alert, Oriented x 3, No new F.N deficits, Normal affectd.  Symmetrical Chest wall movement, Good air movement bilaterally, CTAB RRR,No Gallops,Rubs or new Murmurs, No Parasternal Heave +ve B.Sounds, Abd  Soft, Non tender, No Cyanosis, Clubbing or edema, No new Rash or bruise  Data Review   CBC w Diff:  Lab  Results  Component Value Date   WBC 8.9 04/23/2018   HGB 14.1 04/23/2018   HGB 16.6 04/19/2018   HCT 41.9 04/23/2018   HCT 48.2 04/19/2018   PLT 189 04/23/2018   PLT 170 04/19/2018   LYMPHOPCT 30 05/10/2017   MONOPCT 8 05/10/2017   EOSPCT 3 05/10/2017   BASOPCT 0 05/10/2017    CMP:  Lab Results  Component Value Date   NA 137 04/23/2018   K 4.3 04/23/2018   CL 103 04/23/2018   CO2 26 04/23/2018   BUN 14 04/23/2018   CREATININE 1.14 04/23/2018   CREATININE 1.01 05/10/2017   PROT 6.4 (L) 04/23/2018   ALBUMIN 3.5 04/23/2018   BILITOT 1.1 04/23/2018   ALKPHOS 52 04/23/2018   AST 41 04/23/2018   ALT 64 (H) 04/23/2018  .   Total Time in preparing paper work, data evaluation and todays exam - 72 minutes  Phillips Climes M.D on 04/23/2018 at 11:45 AM  [facility]   Office  [phone]

## 2018-04-23 NOTE — Progress Notes (Addendum)
Patient was discharged home by MD order; discharged instructions  review and give to patient with care notes and prescription; IV DIC; skin intact; patient refused to be  escorted to the car by staff.

## 2018-04-23 NOTE — Care Management Note (Signed)
Case Management Note  Patient Details  Name: Maricela Kawahara MRN: [id number] Date of Birth: 1971/02/10  Subjective/Objective:    Presents with c/o HA/ ?   viral meningitis. Hx of hyperlipidemia, h/o herpes genitalis. From home with spouse.    Winter Trefz (Spouse)      [phone]      PCP: Rita Ohara               Action/Plan: Transition to home when medically stable... NCM following for disposition needs.  Expected Discharge Date:  04/23/18               Expected Discharge Plan:  Home/Self Care  In-House Referral:     Discharge planning Services  CM Consult  Post Acute Care Choice:    Choice offered to:     DME Arranged:    DME Agency:     HH Arranged:    HH Agency:     Status of Service:  In process, will continue to follow  If discussed at Long Length of Stay Meetings, dates discussed:    Additional Comments:  Sharin Mons, RN 04/23/2018, 8:23 AM

## 2018-04-24 LAB — PATHOLOGIST SMEAR REVIEW: Path Review: INCREASED

## 2018-04-25 LAB — CSF CULTURE W GRAM STAIN: Culture: NO GROWTH

## 2018-04-27 LAB — B. BURGDORFI ANTIBODIES, CSF

## 2018-05-08 ENCOUNTER — Ambulatory Visit: Payer: 59 | Admitting: Sports Medicine

## 2018-05-08 VITALS — BP 118/90 | Ht 73.0 in | Wt 215.0 lb

## 2018-05-08 DIAGNOSIS — M7711 Lateral epicondylitis, right elbow: Secondary | ICD-10-CM | POA: Diagnosis not present

## 2018-05-09 NOTE — Progress Notes (Signed)
   Subjective:    Patient ID: Larry Cruz, male    DOB: October 12, 1970, 47 y.o.   MRN: [id number]  HPI   Patient comes in today for follow-up on right elbow lateral epicondylitis. Overall, he is about 75% better after a cortisone injection. He did have a rather significant steroid flare which was treated with oral steroids. He has not yet returned to tennis. He was recently diagnosed with viral meningitis and had to be hospitalized for a couple of days. He has not felt like playing tennis but hopes to get back to it soon. He still endorses some lateral elbow pain with certain activities such as when sleeping at night with the elbow in a deep flexed position. He states that the elbow will get stiff and sore but it will resolve if he repositions his elbow. He continues with his home exercises.   Review of Systems As above    Objective:   Physical Exam  Well-developed, well-nourished. No acute distress. Awake alert and oriented 3. Vital signs reviewed  Right elbow: Full range of motion. No effusion. No erythema. Slight tenderness to palpation of the lateral epicondyles with only slight pain with resisted wrist extension. Good grip strength. Good pulses.      Assessment & Plan:   Improving lateral epicondylitis, right elbow Recent hospitalization for viral meningitis  Patient will continue with his home exercises. When he returns to tennis he will take a close look at his tennis racquet to make sure that the string tension is correct and the grip is correct.I also asked him to have his form evaluated closely particularly in regards to backhand strokes. He does have a copy of the return to tennis protocol and once he has fully recovered from his viral meningitis he may start that protocol and advance as tolerated. If symptoms persist or once again worsen then I would consider merits of further diagnostic imaging at that time. Otherwise, patient will follow-up with me for ongoing or recalcitrant  issues.

## 2018-05-15 NOTE — Progress Notes (Signed)
Chief Complaint  Patient presents with  . Annual Exam    fasting annual exam. Sees [facility] and is UTD. No concerns.   . Flu Vaccine    will get through work.     Larry Cruz is a 47 y.o. male who presents for a complete physical.  He has the following concerns:   Right lateral epicondylitis--under the care of Dr. Micheline Chapman.  Had cortisone shot in July--which caused flare of pain, requiring oral steroids, shortly after which he was hospitalized with suspected viral/aseptic meningitis. He has some residual mild headaches on the top of his head. He has been taking [place] regularly recently (as headaches had been affecting sleep). He hasn't been exercising much due to the jarring nature of tennis affecting his head/causing headache. Still has some right elbow pain.  Hyperlipidemia follow-up: Patient is reportedly following a low-fat, low cholesterol diet. Compliant with medications and denies medication side effects.   Last year he had missed pills while on vacation prior to his lipid testing: Lab Results  Component Value Date   CHOL 204 (H) 05/10/2017   HDL 47 05/10/2017   LDLCALC 129 (H) 05/10/2017   TRIG 139 05/10/2017   CHOLHDL 4.3 05/10/2017    Insomnia--intermittent.Usually, there may be weeks he may need Ambien 1-2 times, but then goes months without needing it at all. Last filled #30 in 10/2017. Has been taking it more regularly since meningitis (afraid of headache keeping him awake). Needs refill.  Genital HSV:  Infrequent flares.  Last refilled Valtrex #30 in 10/2017. Doesn't need refill.  He previously reported fluttering sensation in his heart, infrequently, usually when dehydrated.  No associated chest pain or dyspnea with episodes. He recalls having this in the past, once while at work, and had an EKG (RN from cath lab came and did it). He doesn't recall exactly what they said, recalls possibly SVT.  They weren't particularly concerned.  It happens very infrequently. 0-1  times in the last year, stays well hydrated.   Immunization History  Administered Date(s) Administered  . Influenza Split 07/03/2012, 07/03/2014  . Tdap 10/22/2012   gets flu shots yearly at work [employer]) Last colonoscopy: never  Last PSA: never  Dentist: twice yearly  Ophtho: 07/2017 (at [facility]) Exercise: Limited recently due to headaches from meningitis. Has been doing elliptical recently.  Usual routine is tennis 2-3x/week, singles and doubles. Coaches soccer 2 times/week.  some push-ups Vitamin D-OH normal in 04/2015 (40)  Past Medical History:  Diagnosis Date  . Colorblind   . Dyslipidemia   . 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 Low back pain   . Meningitis 2019   negative cultures    Past Surgical History:  Procedure Laterality Date  . CERVICAL DISC SURGERY  2009   JONES, MD; fusion  . LUMBAR DISC SURGERY  2000   CARTER, MD; L4-L5 (right)  . LUMBAR DISC SURGERY  11/2013   L4-5, Dr. Ronnald Ramp (left)  . VASECTOMY  11/09    Social History   Socioeconomic History  . Marital status: Married    Spouse name: Not on file  . Number of children: 2  . Years of education: Not on file  . Highest education level: Not on file  Occupational History  . Occupation: Editor, commissioning: [employer]  . Financial resource strain: Not on file  . Food insecurity:    Worry: Not on file  Inability: Not on file  . Transportation needs:    Medical: Not on file    Non-medical: Not on file  Tobacco Use  . Smoking status: Never Smoker  . Smokeless tobacco: Never Used  Substance and Sexual Activity  . Alcohol use: Yes    Comment: 1 drink 3-4 times per week.  . Drug use: No  . Sexual activity: Yes    Partners: Female    Birth control/protection: Surgical    Comment: vasectomy  Lifestyle  . Physical activity:    Days per week: Not on file    Minutes per session: Not on file  . Stress: Not on file  Relationships  .  Social connections:    Talks on phone: Not on file    Gets together: Not on file    Attends religious service: Not on file    Active member of club or organization: Not on file    Attends meetings of clubs or organizations: Not on file    Relationship status: Not on file  . Intimate partner violence:    Fear of current or ex partner: Not on file    Emotionally abused: Not on file    Physically abused: Not on file    Forced sexual activity: Not on file  Other Topics Concern  . Not on file  Social History Narrative   Lives with wife, daughter, son and dog    Family History  Problem Relation Age of Onset  . Heart disease Father        CABG in mid 46's  . Hypertension Father   . Alzheimer's disease Father   . Hyperlipidemia Father   . Diabetes Father   . Hyperlipidemia Mother   . Hyperlipidemia Sister   . Hyperlipidemia Brother   . Hypertension Brother   . Hyperlipidemia Brother   . Hyperlipidemia Sister   . Melanoma Sister   . Breast cancer Sister 58  . Cancer Sister   . Hyperlipidemia Sister   . Breast cancer Sister 66  . Cancer Sister   . Hyperlipidemia Sister   . Hyperlipidemia Sister   . Hyperlipidemia Sister   . Hyperlipidemia Sister   . Asthma Son     Outpatient Encounter Medications as of 05/16/2018  Medication Sig  . atorvastatin (LIPITOR) 20 MG tablet Take 1 tablet (20 mg total) by mouth daily.  Marland Kitchen zolpidem (AMBIEN) 10 MG tablet TAKE 1 TABLET BY MOUTH NIGHTLY AT BEDTIME AS NEEDED  . [DISCONTINUED] atorvastatin (LIPITOR) 20 MG tablet TAKE 1 TABLET BY MOUTH DAILY.  . [DISCONTINUED] zolpidem (AMBIEN) 10 MG tablet TAKE 1 TABLET BY MOUTH NIGHTLY AT BEDTIME AS NEEDED  . valACYclovir (VALTREX) 500 MG tablet TAKE 1 TABLET BY MOUTH 2 TIMES DAILY. USE FOR TWICE DAILY FOR 3 DAYS AS NEEDED FOR OUTBREAKS (Patient not taking: Reported on 04/19/2018)  . [DISCONTINUED] butalbital-acetaminophen-caffeine (FIORICET, ESGIC) 50-325-40 MG tablet Take 1 tablet by mouth every 6 (six)  hours as needed for headache.  . [DISCONTINUED] ondansetron (ZOFRAN-ODT) 8 MG disintegrating tablet Take 1 tablet (8 mg total) by mouth every 8 (eight) hours as needed for nausea. (Patient not taking: Reported on 04/19/2018)  . [DISCONTINUED] SUMAtriptan (IMITREX) 100 MG tablet Take 1/2-1 tablet at onset of headache. May repeat in 2 hours if headache persists/recurs (max 200 mg/24 hrs) (Patient not taking: Reported on 05/08/2018)   No facility-administered encounter medications on file as of 05/16/2018.     No Known Allergies   ROS: The patient denies anorexia, fever, weight  changes, vision loss, decreased hearing, ear pain, hoarseness, chest pain, dizziness, syncope, dyspnea on exertion, cough, swelling, nausea, vomiting, diarrhea, abdominal pain, melena, hematochezia, indigestion/heartburn, hematuria, incontinence, erectile dysfunction, nocturia, weakened urine stream, dysuria, genital lesions, numbness, tingling, weakness, tremor, suspicious skin lesions, depression, anxiety, abnormal bleeding/bruising, or enlarged lymph nodes.  Herpes flares are infrequent. Right lateral epicondylitis per HPI, some persistent pain. Headaches per HPI. Denies vertigo, dysequilibrium or other neuro symptoms. No recent back pain. H/o Plantar fasciitis, has orthotics, no recent problems. Some constipation, chronic, constipation. Rarely has some BRB and discomfort with passing a stool (when constipated), 1-2x/year, not recent Insomnia--per HPI. Goes to the dermatologist yearly, scheduled for next week. No current skin concerns. No significant heart fluttering over the last year.    PHYSICAL EXAM:  BP 120/88   Pulse 64   Ht 6' 0.5" (1.842 m)   Wt 221 lb 6.4 oz (100.4 kg)   BMI 29.61 kg/m   124/86 on repeat by MD  Wt Readings from Last 3 Encounters:  05/16/18 221 lb 6.4 oz (100.4 kg)  05/08/18 215 lb (97.5 kg)  04/23/18 221 lb 12.5 oz (100.6 kg)   218# 6.4oz at CPE last year  General Appearance:   Alert, cooperative, no distress, appears stated age   Head:  Normocephalic, without obvious abnormality, atraumatic   Eyes:  PERRL, conjunctiva/corneas clear, EOM's intact, fundi benign   Ears:  Normal TMs and EACs bilaterally  Nose:  Nares normal, mucosa mildly edematous, no drainage or sinus tenderness   Throat:  Lips, mucosa, and tongue normal; teeth and gums normal   Neck:  Supple, no lymphadenopathy; thyroid: no enlargement/ tenderness/nodules; no carotid bruit or JVD   Back:  Spine nontender, no curvature, ROM normal, no CVA tenderness   Lungs:  Clear to auscultation bilaterally without wheezes, rales or ronchi; respirations unlabored   Chest Wall:  No tenderness or deformity   Heart:  Regular rate and rhythm, S1 and S2 normal, no murmur, rub or gallop   Breast Exam:  No chest wall tenderness, masses or gynecomastia   Abdomen:  Soft, non-tender, nondistended, normoactive bowel sounds, no masses, no hepatosplenomegaly.  Genitalia:  Normal male external genitalia without lesions. Testicles without masses (some scrotal masses related to prior vasectomy present inferoposterior to R testicle, unchanged). No inguinal hernias.   Rectal:  Normal sphincter tone, no masses or tenderness; guaiac negative stool. Prostate smooth, no nodules, not enlarged.   Extremities:  No clubbing, cyanosis or edema   Pulses:  2+ and symmetric all extremities   Skin:  Skin color, texture, turgor normal, no lesions.  Lymph nodes:  Cervical, supraclavicular, and inguinal nodes normal   Neurologic:  CNII-XII intact, normal strength, sensation and gait; reflexes 2+ and symmetric throughout    Psych: Normal mood, affect, hygiene and grooming   Lab Results  Component Value Date   WBC 8.9 04/23/2018   HGB 14.1 04/23/2018   HCT 41.9 04/23/2018   MCV 93.5 04/23/2018   PLT 189 04/23/2018     Chemistry      Component Value Date/Time   NA 137 04/23/2018 0503   K 4.3  04/23/2018 0503   CL 103 04/23/2018 0503   CO2 26 04/23/2018 0503   BUN 14 04/23/2018 0503   CREATININE 1.14 04/23/2018 0503   CREATININE 1.01 05/10/2017 0858      Component Value Date/Time   CALCIUM 8.9 04/23/2018 0503   ALKPHOS 52 04/23/2018 0503   AST 41 04/23/2018 0503   ALT 64 (  H) 04/23/2018 0503   BILITOT 1.1 04/23/2018 0503      ASSESSMENT/PLAN:  Annual physical exam - Plan: POCT Urinalysis DIP (Proadvantage Device), Lipid panel, Glucose, random, Hepatic function panel  Pure hypercholesterolemia - Plan: atorvastatin (LIPITOR) 20 MG tablet, Lipid panel, Hepatic function panel  Aseptic meningitis - suspect was viral. Some mild persistent headaches. Offered neuro, declines for now.  Genital herpes simplex, unspecified site - rare, cont valtrex prn  Insomnia, unspecified type - Plan: zolpidem (AMBIEN) 10 MG tablet  BMI 29.0-29.9,adult - counseled re: diet/weight loss (recommended losing abdominal fat)--portion control, limiting late night snacking  Medication monitoring encounter - Plan: Lipid panel, Hepatic function panel  Borderline BP today, improved slightly on recheck. Recommended resuming regular exercise (may need to alter the type of activity), low sodium diet.  Briefly discussed his tennis elbow, not examined today (sees Dr. Wonda Amis tried nitroglycerin patch, suggested maybe he ask about this if persistent pain; will not do another injection.  Lipid, LFT (recent lab had mildly elevated ALT), glu  Recommended at least 30 minutes of aerobic activity at least 5 days/week, weight bearing exercise 2x/wk; proper sunscreen use reviewed; healthy diet and alcohol recommendations (less than or equal to 2 drinks/day) reviewed; regular seatbelt use; changing batteries in smoke detectors. Self-testicular exams. Immunization recommendations discussed--continue yearly flu shots. Colonoscopy recommendations reviewed--age 59 (briefly mentioned certain guidelines changing to  43).   F/u 1 year, sooner prn

## 2018-05-16 ENCOUNTER — Encounter: Payer: Self-pay | Admitting: Family Medicine

## 2018-05-16 ENCOUNTER — Ambulatory Visit (INDEPENDENT_AMBULATORY_CARE_PROVIDER_SITE_OTHER): Payer: 59 | Admitting: Family Medicine

## 2018-05-16 VITALS — BP 120/88 | HR 64 | Ht 72.5 in | Wt 221.4 lb

## 2018-05-16 DIAGNOSIS — G030 Nonpyogenic meningitis: Secondary | ICD-10-CM

## 2018-05-16 DIAGNOSIS — Z6829 Body mass index (BMI) 29.0-29.9, adult: Secondary | ICD-10-CM

## 2018-05-16 DIAGNOSIS — A6000 Herpesviral infection of urogenital system, unspecified: Secondary | ICD-10-CM

## 2018-05-16 DIAGNOSIS — Z0000 Encounter for general adult medical examination without abnormal findings: Secondary | ICD-10-CM | POA: Diagnosis not present

## 2018-05-16 DIAGNOSIS — Z5181 Encounter for therapeutic drug level monitoring: Secondary | ICD-10-CM | POA: Diagnosis not present

## 2018-05-16 DIAGNOSIS — G4700 Insomnia, unspecified: Secondary | ICD-10-CM

## 2018-05-16 DIAGNOSIS — E7800 Pure hypercholesterolemia, unspecified: Secondary | ICD-10-CM | POA: Diagnosis not present

## 2018-05-16 LAB — POCT URINALYSIS DIP (PROADVANTAGE DEVICE)
BILIRUBIN UA: NEGATIVE
GLUCOSE UA: NEGATIVE mg/dL
Ketones, POC UA: NEGATIVE mg/dL
Leukocytes, UA: NEGATIVE
NITRITE UA: NEGATIVE
PH UA: 6.5 (ref 5.0–8.0)
Protein Ur, POC: NEGATIVE mg/dL
RBC UA: NEGATIVE
SPECIFIC GRAVITY, URINE: 1.015
UUROB: NEGATIVE

## 2018-05-16 MED ORDER — ATORVASTATIN CALCIUM 20 MG PO TABS: 20.0000 mg | ORAL_TABLET | Freq: Every day | ORAL | 3 refills | Status: DC

## 2018-05-16 MED ORDER — ZOLPIDEM TARTRATE 10 MG PO TABS
ORAL_TABLET | ORAL | 1 refills | Status: DC
Start: 2018-05-16 — End: 2018-08-21

## 2018-05-16 MED FILL — ATORVASTATIN CALCIUM 20 MG: 20 | 90 days supply | Qty: 90 | Fill #0

## 2018-05-16 MED FILL — ZOLPIDEM TARTRATE 10 MG TAB: 10 | 30 days supply | Qty: 30 | Fill #0

## 2018-05-16 NOTE — Patient Instructions (Addendum)
  HEALTH MAINTENANCE RECOMMENDATIONS:  It is recommended that you get at least 30 minutes of aerobic exercise at least 5 days/week (for weight loss, you may need as much as 60-90 minutes). This can be any activity that gets your heart rate up. This can be divided in 10-15 minute intervals if needed, but try and build up your endurance at least once a week.  Weight bearing exercise is also recommended twice weekly.  Eat a healthy diet with lots of vegetables, fruits and fiber.  "Colorful" foods have a lot of vitamins (ie green vegetables, tomatoes, red peppers, etc).  Limit sweet tea, regular sodas and alcoholic beverages, all of which has a lot of calories and sugar.  Up to 2 alcoholic drinks daily may be beneficial for men (unless trying to lose weight, watch sugars).  Drink a lot of water.  Sunscreen of at least SPF 30 should be used on all sun-exposed parts of the skin when outside between the hours of 10 am and 4 pm (not just when at beach or pool, but even with exercise, golf, tennis, and yard work!)  Use a sunscreen that says "broad spectrum" so it covers both UVA and UVB rays, and make sure to reapply every 1-2 hours.  Remember to change the batteries in your smoke detectors when changing your clock times in the spring and fall.  Use your seat belt every time you are in a car, and please drive safely and not be distracted with cell phones and texting while driving.  Let me know if you're interested in seeing the neurologist, if headaches persist, fail to improve or worsen in any way.  Consider asking Dr. Micheline Chapman about use of nitroglycerin patch for your tennis elbow.

## 2018-05-17 ENCOUNTER — Other Ambulatory Visit: Payer: Self-pay | Admitting: *Deleted

## 2018-05-17 DIAGNOSIS — E7800 Pure hypercholesterolemia, unspecified: Secondary | ICD-10-CM

## 2018-05-17 LAB — HEPATIC FUNCTION PANEL
ALBUMIN: 5.1 g/dL (ref 3.5–5.5)
ALK PHOS: 85 IU/L (ref 39–117)
ALT: 40 IU/L (ref 0–44)
AST: 28 IU/L (ref 0–40)
Bilirubin Total: 0.5 mg/dL (ref 0.0–1.2)
Bilirubin, Direct: 0.14 mg/dL (ref 0.00–0.40)
TOTAL PROTEIN: 7.6 g/dL (ref 6.0–8.5)

## 2018-05-17 LAB — LIPID PANEL
CHOL/HDL RATIO: 4.1 ratio (ref 0.0–5.0)
Cholesterol, Total: 253 mg/dL — ABNORMAL HIGH (ref 100–199)
HDL: 61 mg/dL (ref >39)
LDL CALC: 163 mg/dL — AB (ref 0–99)
TRIGLYCERIDES: 146 mg/dL (ref 0–149)
VLDL Cholesterol Cal: 29 mg/dL (ref 5–40)

## 2018-05-17 LAB — GLUCOSE, RANDOM: GLUCOSE: 99 mg/dL (ref 65–99)

## 2018-05-22 ENCOUNTER — Encounter: Payer: Self-pay | Admitting: Family Medicine

## 2018-05-23 ENCOUNTER — Telehealth: Payer: Self-pay | Admitting: *Deleted

## 2018-05-23 DIAGNOSIS — G030 Nonpyogenic meningitis: Secondary | ICD-10-CM

## 2018-05-23 DIAGNOSIS — R51 Headache: Secondary | ICD-10-CM

## 2018-05-23 DIAGNOSIS — R519 Headache, unspecified: Secondary | ICD-10-CM

## 2018-05-23 NOTE — Telephone Encounter (Signed)
Order entered

## 2018-05-23 NOTE — Telephone Encounter (Signed)
Patient advised.

## 2018-05-23 NOTE — Telephone Encounter (Signed)
Patient called and headaches are not getting any better-would like to know if you can put in referral for him to see Dr Jaynee Eagles @ [place]- he called there already and this particular provider can get in him pretty quickly if we put in referral. Thanks.

## 2018-05-24 ENCOUNTER — Encounter: Payer: Self-pay | Admitting: Neurology

## 2018-05-24 ENCOUNTER — Ambulatory Visit: Payer: 59 | Admitting: Neurology

## 2018-05-24 VITALS — BP 126/82 | HR 55 | Ht 73.0 in

## 2018-05-24 DIAGNOSIS — G030 Nonpyogenic meningitis: Secondary | ICD-10-CM

## 2018-05-24 MED ORDER — BUTALBITAL-APAP-CAFFEINE 50-325-40 MG PO TABS: 2.0000 | ORAL_TABLET | Freq: Four times a day (QID) | ORAL | 2 refills | Status: DC | PRN

## 2018-05-24 MED ORDER — METHYLPREDNISOLONE 4 MG PO TBPK: ORAL_TABLET | ORAL | 1 refills | Status: DC

## 2018-05-24 MED ORDER — TRAMADOL HCL 50 MG PO TABS: 50.0000 mg | ORAL_TABLET | Freq: Four times a day (QID) | ORAL | 3 refills | Status: DC | PRN

## 2018-05-24 MED FILL — traMADol HCL 50 MG TABS: 50 | 8 days supply | Qty: 30 | Fill #0

## 2018-05-24 MED FILL — METHYLPREDNISOLONE 4 MG TAB: 4 | 6 days supply | Qty: 21 | Fill #0

## 2018-05-24 MED FILL — BUTALB-ACETAMIN-CAFF 50-325: 50-325-40 | 4 days supply | Qty: 30 | Fill #0

## 2018-05-24 NOTE — Progress Notes (Signed)
[facility]    Provider:  Dr Jaynee Eagles Referring Provider: Rita Ohara, MD Primary Care Physician:  Rita Ohara, MD  CC:  Post-infectious headache  HPI:  Larry Cruz is a 47 y.o. male here as requested by Dr. Tomi Bamberger for headache. PMHx Migraines, viral meningitis. He had a severe headache, ended up with septic meningitis.  Headache started about a week prior to admission, went to the emergency room. Was treated in the hospital admitted august 3rd at its worst, nothing made it better or worse, but was the worst headache he is ever had it was severe and continuous.  Headache has significantly improved since being discharged however he has a residual headache.  He has a constant steady headache 1-2 continuous but often it gets 4-5 or 6.  The headache is in the frontal area and feels like pressure.  Prior to the headache, he is unsure if there was any inciting event he did have elbow injury and then Cortizone injection at elbow, then 6-day oral dose of steroids for elbow, at the end of the oral taper is when the headache started, more pressure, frontotemporal, no blurry vision, no neck pain.  He was around sick children he might have picked up a virus he is unsure.  Nothing like this is ever happened to him before. No other focal neurologic deficits, associated symptoms, inciting events or modifiable factors.  Reviewed notes, labs and imaging from outside physicians, which showed: Reviewed notes from the emergency room, his admission his discharge.  Patient had been complaining of a headache for 1 week prior to going to the emergency room, he and his wife had a viral illness around the same time.  He had also been in contact with many people about 90 people the Saturday prior.  He had been on a steroid taper and had an elbow injection of cortisone as well.  He denied tick bites, mosquito bites but does work outdoors, he noted slight neck stiffness but denied nausea vomiting or significant  fever.  He had completed 1 week course of doxycycline which was stopped due to his RMSF titer negative.   Reviewed images of CT of the head and CT of the head was unremarkable, reviewed labs and LP tube 1 showed 12 white blood cells, with 94% lymphs, protein 59, glucose 59, crypto antigen negative, Gram stain was negative, no organisms seen, he was admitted for viral meningitis.  HSV and VDRL during hospitalization came back negative.  He was given antivirals while in the hospital and provided supportive care, antivirals were discontinued when HSV came back negative.  He was discharged in improved condition.    Ct showed No acute intracranial abnormalities including mass lesion or mass effect, hydrocephalus, extra-axial fluid collection, midline shift, hemorrhage, or acute infarction, large ischemic events (personally reviewed images)    Review of Systems: Patient complains of symptoms per HPI as well as the following symptoms: headache. Pertinent negatives and positives per HPI. All others negative.   Social History   Socioeconomic History  . Marital status: Married    Spouse name: Not on file  . Number of children: 2  . Years of education: Not on file  . Highest education level: Master's degree (e.g., MA, MS, MEng, MEd, MSW, MBA)  Occupational History  . Occupation: Editor, commissioning: [employer]  . Financial resource strain: Not on file  . Food insecurity:    Worry: Not on file  Inability: Not on file  . Transportation needs:    Medical: Not on file    Non-medical: Not on file  Tobacco Use  . Smoking status: Never Smoker  . Smokeless tobacco: Never Used  Substance and Sexual Activity  . Alcohol use: Yes    Comment: 1 drink 3-4 times per week.  . Drug use: No  . Sexual activity: Yes    Partners: Female    Birth control/protection: Surgical    Comment: vasectomy  Lifestyle  . Physical activity:    Days per week: Not on file      Minutes per session: Not on file  . Stress: Not on file  Relationships  . Social connections:    Talks on phone: Not on file    Gets together: Not on file    Attends religious service: Not on file    Active member of club or organization: Not on file    Attends meetings of clubs or organizations: Not on file    Relationship status: Not on file  . Intimate partner violence:    Fear of current or ex partner: Not on file    Emotionally abused: Not on file    Physically abused: Not on file    Forced sexual activity: Not on file  Other Topics Concern  . Not on file  Social History Narrative   Lives with wife, daughter, son and dog   Right handed   Caffeine: 1 cup daily    Family History  Problem Relation Age of Onset  . Heart disease Father        CABG in mid 30's  . Hypertension Father   . Alzheimer's disease Father   . Hyperlipidemia Father   . Diabetes Father   . Hyperlipidemia Mother   . Hyperlipidemia Sister   . Hyperlipidemia Brother   . Hypertension Brother   . Hyperlipidemia Brother   . Hyperlipidemia Sister   . Melanoma Sister   . Breast cancer Sister 6  . Cancer Sister   . Hyperlipidemia Sister   . Breast cancer Sister 6  . Cancer Sister   . Hyperlipidemia Sister   . Hyperlipidemia Sister   . Hyperlipidemia Sister   . Hyperlipidemia Sister   . Asthma Son     Past Medical History:  Diagnosis Date  . Colorblind   . Dyslipidemia   . 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 Low back pain   . Meningitis 2019   negative cultures    Past Surgical History:  Procedure Laterality Date  . CERVICAL DISC SURGERY  2009   JONES, MD; fusion  . LUMBAR DISC SURGERY  2000   CARTER, MD; L4-L5 (right)  . LUMBAR DISC SURGERY  11/2013   L4-5, Dr. Ronnald Ramp (left)  . VASECTOMY  11/09    Current Outpatient Medications  Medication Sig Dispense Refill  . atorvastatin (LIPITOR) 20 MG tablet Take 1 tablet (20 mg total) by mouth daily. 90 tablet 3  . zolpidem (AMBIEN) 10 MG  tablet TAKE 1 TABLET BY MOUTH NIGHTLY AT BEDTIME AS NEEDED 30 tablet 1  . butalbital-acetaminophen-caffeine (FIORICET, ESGIC) 50-325-40 MG tablet Take 2 tablets by mouth every 6 (six) hours as needed for headache. 30 tablet 2  . methylPREDNISolone (MEDROL DOSEPAK) 4 MG TBPK tablet follow package directions 21 tablet 1  . traMADol (ULTRAM) 50 MG tablet Take 1 tablet (50 mg total) by mouth every 6 (six) hours as needed. 30 tablet 3  . valACYclovir (VALTREX) 500 MG tablet  TAKE 1 TABLET BY MOUTH 2 TIMES DAILY. USE FOR TWICE DAILY FOR 3 DAYS AS NEEDED FOR OUTBREAKS (Patient not taking: Reported on 04/19/2018) 30 tablet 0   No current facility-administered medications for this visit.     Allergies as of 05/24/2018  . (No Known Allergies)    Vitals: BP 126/82 (BP Location: Right Arm, Patient Position: Sitting)   Pulse (!) 55   Ht 6\' 1"  (1.854 m)   BMI 29.21 kg/m   Weight 225 Last Weight:  Wt Readings from Last 1 Encounters:  05/16/18 221 lb 6.4 oz (100.4 kg)   Last Height:   Ht Readings from Last 1 Encounters:  05/24/18 6\' 1"  (1.854 m)   Physical exam: Exam: Gen: NAD, conversant, well nourised, obese, well groomed                     CV: RRR, no MRG. No Carotid Bruits. No peripheral edema, warm, nontender Eyes: Conjunctivae clear without exudates or hemorrhage  Neuro: Detailed Neurologic Exam  Speech:    Speech is normal; fluent and spontaneous with normal comprehension.  Cognition:    The patient is oriented to person, place, and time;     recent and remote memory intact;     language fluent;     normal attention, concentration,     fund of knowledge Cranial Nerves:    The pupils are equal, round, and reactive to light. The fundi are normal and spontaneous venous pulsations are present. Visual fields are full to finger confrontation. Extraocular movements are intact. Trigeminal sensation is intact and the muscles of mastication are normal. The face is symmetric. The palate  elevates in the midline. Hearing intact. Voice is normal. Shoulder shrug is normal. The tongue has normal motion without fasciculations.   Coordination:    Normal finger to nose and heel to shin. Normal rapid alternating movements.   Gait:    Heel-toe and tandem gait are normal.   Motor Observation:    No asymmetry, no atrophy, and no involuntary movements noted. Tone:    Normal muscle tone.    Posture:    Posture is normal. normal erect    Strength:    Strength is V/V in the upper and lower limbs.      Sensation: intact to LT     Reflex Exam:  DTR's:    Deep tendon reflexes in the upper and lower extremities are normal bilaterally.   Toes:    The toes are downgoing bilaterally.   Clonus:    Clonus is absent.       Assessment/Plan: This is a 47 year old male who was recently admitted for aseptic meningitis, LP showed elevated white blood cells predominantly lymphocytic, HSV PCR CSF was negative.  He has a continued headache likely postinfectious headache.  -Recommended MRI of the brain patient declines at this time.  I did discuss if his headaches do not improve that we should have MRI imaging of the brain to evaluate for sequelae of meningitis  - Conservative measures, pain control: Likely continued inflammation we will give him a Medrol Dosepak for 6 days and tramadol on Fioricet as needed for a limited time.  Discussed: To prevent or relieve headaches, try the following: Cool Compress. Lie down and place a cool compress on your head.  Avoid headache triggers. If certain foods or odors seem to have triggered your migraines in the past, avoid them. A headache diary might help you identify triggers.  Include physical activity in  your daily routine. Try a daily walk or other moderate aerobic exercise.  Manage stress. Find healthy ways to cope with the stressors, such as delegating tasks on your to-do list.  Practice relaxation techniques. Try deep breathing, yoga, massage  and visualization.  Eat regularly. Eating regularly scheduled meals and maintaining a healthy diet might help prevent headaches. Also, drink plenty of fluids.  Follow a regular sleep schedule. Sleep deprivation might contribute to headaches Consider biofeedback. With this mind-body technique, you learn to control certain bodily functions - such as muscle tension, heart rate and blood pressure - to prevent headaches or reduce headache pain.    Proceed to emergency room if you experience new or worsening symptoms or symptoms do not resolve, if you have new neurologic symptoms or if headache is severe, or for any concerning symptom.    Sarina Ill, MD  [facility] [address] [place], [place] [address]  Phone [phone] Fax [phone]

## 2018-05-24 NOTE — Patient Instructions (Signed)
Steroids x 6 days: Take daily in the morning with food Tramadol and or Fioricet as needed Recommend MRI brain w/wo contrast   Methylprednisolone tablets What is this medicine? METHYLPREDNISOLONE (meth ill pred NISS oh lone) is a corticosteroid. It is commonly used to treat inflammation of the skin, joints, lungs, and other organs. Common conditions treated include asthma, allergies, and arthritis. It is also used for other conditions, such as blood disorders and diseases of the adrenal glands. This medicine may be used for other purposes; ask your health care provider or pharmacist if you have questions. COMMON BRAND NAME(S): Medrol, Medrol Dosepak What should I tell my health care provider before I take this medicine? They need to know if you have any of these conditions: -Cushing's syndrome -eye disease, vision problems -diabetes -glaucoma -heart disease -high blood pressure -infection (especially a virus infection such as chickenpox, cold sores, or herpes) -liver disease -mental illness -myasthenia gravis -osteoporosis -recently received or scheduled to receive a vaccine -seizures -stomach or intestine problems -thyroid disease -an unusual or allergic reaction to lactose, methylprednisolone, other medicines, foods, dyes, or preservatives -pregnant or trying to get pregnant -breast-feeding How should I use this medicine? Take this medicine by mouth with a glass of water. Follow the directions on the prescription label. Take this medicine with food. If you are taking this medicine once a day, take it in the morning. Do not take it more often than directed. Do not suddenly stop taking your medicine because you may develop a severe reaction. Your doctor will tell you how much medicine to take. If your doctor wants you to stop the medicine, the dose may be slowly lowered over time to avoid any side effects. Talk to your pediatrician regarding the use of this medicine in children.  Special care may be needed. Overdosage: If you think you have taken too much of this medicine contact a poison control center or emergency room at once. NOTE: This medicine is only for you. Do not share this medicine with others. What if I miss a dose? If you miss a dose, take it as soon as you can. If it is almost time for your next dose, talk to your doctor or health care professional. You may need to miss a dose or take an extra dose. Do not take double or extra doses without advice. What may interact with this medicine? Do not take this medicine with any of the following medications: -alefacept -echinacea -live virus vaccines -metyrapone -mifepristone This medicine may also interact with the following medications: -amphotericin B -aspirin and aspirin-like medicines -certain antibiotics like erythromycin, clarithromycin, troleandomycin -certain medicines for diabetes -certain medicines for fungal infections like ketoconazole -certain medicines for seizures like carbamazepine, phenobarbital, phenytoin -certain medicines that treat or prevent blood clots like warfarin -cholestyramine -cyclosporine -digoxin -diuretics -male hormones, like estrogens and birth control pills -isoniazid -NSAIDs, medicines for pain inflammation, like ibuprofen or naproxen -other medicines for myasthenia gravis -rifampin -vaccines This list may not describe all possible interactions. Give your health care provider a list of all the medicines, herbs, non-prescription drugs, or dietary supplements you use. Also tell them if you smoke, drink alcohol, or use illegal drugs. Some items may interact with your medicine. What should I watch for while using this medicine? Tell your doctor or healthcare professional if your symptoms do not start to get better or if they get worse. Do not stop taking except on your doctor's advice. You may develop a severe reaction. Your doctor will tell  you how much medicine to  take. This medicine may increase your risk of getting an infection. Tell your doctor or health care professional if you are around anyone with measles or chickenpox, or if you develop sores or blisters that do not heal properly. This medicine may affect blood sugar levels. If you have diabetes, check with your doctor or health care professional before you change your diet or the dose of your diabetic medicine. Tell your doctor or health care professional right away if you have any change in your eyesight. Using this medicine for a long time may increase your risk of low bone mass. Talk to your doctor about bone health. What side effects may I notice from receiving this medicine? Side effects that you should report to your doctor or health care professional as soon as possible: -allergic reactions like skin rash, itching or hives, swelling of the face, lips, or tongue -bloody or tarry stools -changes in vision -hallucination, loss of contact with reality -muscle cramps -muscle pain -palpitations -signs and symptoms of high blood sugar such as dizziness; dry mouth; dry skin; fruity breath; nausea; stomach pain; increased hunger or thirst; increased urination -signs and symptoms of infection like fever or chills; cough; sore throat; pain or trouble passing urine -trouble passing urine or change in the amount of urine Side effects that usually do not require medical attention (report to your doctor or health care professional if they continue or are bothersome): -changes in emotions or mood -constipation -diarrhea -excessive hair growth on the face or body -headache -nausea, vomiting -trouble sleeping -weight gain This list may not describe all possible side effects. Call your doctor for medical advice about side effects. You may report side effects to FDA at 1-800-FDA-1088. Where should I keep my medicine? Keep out of the reach of children. Store at room temperature between 20 and 25  degrees C (68 and 77 degrees F). Throw away any unused medicine after the expiration date. NOTE: This sheet is a summary. It may not cover all possible information. If you have questions about this medicine, talk to your doctor, pharmacist, or health care provider.  2018 Elsevier/Gold Standard (2015-11-12 15:53:30)    Tramadol tablets What is this medicine? TRAMADOL (TRA ma dole) is a pain reliever. It is used to treat moderate to severe pain in adults. This medicine may be used for other purposes; ask your health care provider or pharmacist if you have questions. COMMON BRAND NAME(S): Ultram What should I tell my health care provider before I take this medicine? They need to know if you have any of these conditions: -brain tumor -depression -drug abuse or addiction -head injury -if you frequently drink alcohol containing drinks -kidney disease or trouble passing urine -liver disease -lung disease, asthma, or breathing problems -seizures or epilepsy -suicidal thoughts, plans, or attempt; a previous suicide attempt by you or a family member -an unusual or allergic reaction to tramadol, codeine, other medicines, foods, dyes, or preservatives -pregnant or trying to get pregnant -breast-feeding How should I use this medicine? Take this medicine by mouth with a full glass of water. Follow the directions on the prescription label. You can take it with or without food. If it upsets your stomach, take it with food. Do not take your medicine more often than directed. A special MedGuide will be given to you by the pharmacist with each prescription and refill. Be sure to read this information carefully each time. Talk to your pediatrician regarding the use of this  medicine in children. Special care may be needed. Overdosage: If you think you have taken too much of this medicine contact a poison control center or emergency room at once. NOTE: This medicine is only for you. Do not share this  medicine with others. What if I miss a dose? If you miss a dose, take it as soon as you can. If it is almost time for your next dose, take only that dose. Do not take double or extra doses. What may interact with this medicine? Do not take this medication with any of the following medicines: -MAOIs like Carbex, Eldepryl, Marplan, Nardil, and Parnate This medicine may also interact with the following medications: -alcohol -antihistamines for allergy, cough and cold -certain medicines for anxiety or sleep -certain medicines for depression like amitriptyline, fluoxetine, sertraline -certain medicines for migraine headache like almotriptan, eletriptan, frovatriptan, naratriptan, rizatriptan, sumatriptan, zolmitriptan -certain medicines for seizures like carbamazepine, oxcarbazepine, phenobarbital, primidone -certain medicines that treat or prevent blood clots like warfarin -digoxin -furazolidone -general anesthetics like halothane, isoflurane, methoxyflurane, propofol -linezolid -local anesthetics like lidocaine, pramoxine, tetracaine -medicines that relax muscles for surgery -other narcotic medicines for pain or cough -phenothiazines like chlorpromazine, mesoridazine, prochlorperazine, thioridazine -procarbazine This list may not describe all possible interactions. Give your health care provider a list of all the medicines, herbs, non-prescription drugs, or dietary supplements you use. Also tell them if you smoke, drink alcohol, or use illegal drugs. Some items may interact with your medicine. What should I watch for while using this medicine? Tell your doctor or health care professional if your pain does not go away, if it gets worse, or if you have new or a different type of pain. You may develop tolerance to the medicine. Tolerance means that you will need a higher dose of the medicine for pain relief. Tolerance is normal and is expected if you take this medicine for a long time. Do not  suddenly stop taking your medicine because you may develop a severe reaction. Your body becomes used to the medicine. This does NOT mean you are addicted. Addiction is a behavior related to getting and using a drug for a non-medical reason. If you have pain, you have a medical reason to take pain medicine. Your doctor will tell you how much medicine to take. If your doctor wants you to stop the medicine, the dose will be slowly lowered over time to avoid any side effects. There are different types of narcotic medicines (opiates). If you take more than one type at the same time or if you are taking another medicine that also causes drowsiness, you may have more side effects. Give your health care provider a list of all medicines you use. Your doctor will tell you how much medicine to take. Do not take more medicine than directed. Call emergency for help if you have problems breathing or unusual sleepiness. You may get drowsy or dizzy. Do not drive, use machinery, or do anything that needs mental alertness until you know how this medicine affects you. Do not stand or sit up quickly, especially if you are an older patient. This reduces the risk of dizzy or fainting spells. Alcohol can increase or decrease the effects of this medicine. Avoid alcoholic drinks. You may have constipation. Try to have a bowel movement at least every 2 to 3 days. If you do not have a bowel movement for 3 days, call your doctor or health care professional. Your mouth may get dry. Chewing sugarless gum or sucking  hard candy, and drinking plenty of water may help. Contact your doctor if the problem does not go away or is severe. What side effects may I notice from receiving this medicine? Side effects that you should report to your doctor or health care professional as soon as possible: -allergic reactions like skin rash, itching or hives, swelling of the face, lips, or tongue -breathing problems -confusion -seizures -signs and  symptoms of low blood pressure like dizziness; feeling faint or lightheaded, falls; unusually weak or tired -trouble passing urine or change in the amount of urine Side effects that usually do not require medical attention (report to your doctor or health care professional if they continue or are bothersome): -constipation -dry mouth -nausea, vomiting -tiredness This list may not describe all possible side effects. Call your doctor for medical advice about side effects. You may report side effects to FDA at 1-800-FDA-1088. Where should I keep my medicine? Keep out of the reach of children. This medicine may cause accidental overdose and death if it taken by other adults, children, or pets. Mix any unused medicine with a substance like cat litter or coffee grounds. Then throw the medicine away in a sealed container like a sealed bag or a coffee can with a lid. Do not use the medicine after the expiration date. Store at room temperature between 15 and 30 degrees C (59 and 86 degrees F). NOTE: This sheet is a summary. It may not cover all possible information. If you have questions about this medicine, talk to your doctor, pharmacist, or health care provider.  2018 Elsevier/Gold Standard (2015-05-31 09:00:04)    Acetaminophen; Butalbital; Caffeine tablets or capsules What is this medicine? ACETAMINOPHEN; BUTALBITAL; CAFFEINE (a set a MEE noe fen; byoo TAL bi tal; KAF een) is a pain reliever. It is used to treat tension headaches. This medicine may be used for other purposes; ask your health care provider or pharmacist if you have questions. COMMON BRAND NAME(S): Alagesic, Americet, Anolor-300, Arcet, BAC, CAPACET, Dolgic Plus, Esgic, Esgic Plus, Ezol, Fioricet, [employer], Medigesic, Landess, Pacaps, Phrenilin Forte, Repan, [place], Triad, Zebutal What should I tell my health care provider before I take this medicine? They need to know if you have any of these conditions: -drug abuse or  addiction -heart or circulation problems -if you often drink alcohol -kidney disease or problems going to the bathroom -liver disease -lung disease, asthma, or breathing problems -porphyria -an unusual or allergic reaction to acetaminophen, butalbital or other barbiturates, caffeine, other medicines, foods, dyes, or preservatives -pregnant or trying to get pregnant -breast-feeding How should I use this medicine? Take this medicine by mouth with a full glass of water. Follow the directions on the prescription label. If the medicine upsets your stomach, take the medicine with food or milk. Do not take more than you are told to take. Talk to your pediatrician regarding the use of this medicine in children. Special care may be needed. Overdosage: If you think you have taken too much of this medicine contact a poison control center or emergency room at once. NOTE: This medicine is only for you. Do not share this medicine with others. What if I miss a dose? If you miss a dose, take it as soon as you can. If it is almost time for your next dose, take only that dose. Do not take double or extra doses. What may interact with this medicine? -alcohol or medicines that contain alcohol -antidepressants, especially MAOIs like isocarboxazid, phenelzine, tranylcypromine, and selegiline -antihistamines -  benzodiazepines -carbamazepine -isoniazid -medicines for pain like pentazocine, buprenorphine, butorphanol, nalbuphine, tramadol, and propoxyphene -muscle relaxants -naltrexone -phenobarbital, phenytoin, and fosphenytoin -phenothiazines like perphenazine, thioridazine, chlorpromazine, mesoridazine, fluphenazine, prochlorperazine, promazine, and trifluoperazine -voriconazole This list may not describe all possible interactions. Give your health care provider a list of all the medicines, herbs, non-prescription drugs, or dietary supplements you use. Also tell them if you smoke, drink alcohol, or use  illegal drugs. Some items may interact with your medicine. What should I watch for while using this medicine? Tell your doctor or health care professional if your pain does not go away, if it gets worse, or if you have new or a different type of pain. You may develop tolerance to the medicine. Tolerance means that you will need a higher dose of the medicine for pain relief. Tolerance is normal and is expected if you take the medicine for a long time. Do not suddenly stop taking your medicine because you may develop a severe reaction. Your body becomes used to the medicine. This does NOT mean you are addicted. Addiction is a behavior related to getting and using a drug for a non-medical reason. If you have pain, you have a medical reason to take pain medicine. Your doctor will tell you how much medicine to take. If your doctor wants you to stop the medicine, the dose will be slowly lowered over time to avoid any side effects. You may get drowsy or dizzy when you first start taking the medicine or change doses. Do not drive, use machinery, or do anything that may be dangerous until you know how the medicine affects you. Stand or sit up slowly. Do not take other medicines that contain acetaminophen with this medicine. Always read labels carefully. If you have questions, ask your doctor or pharmacist. If you take too much acetaminophen get medical help right away. Too much acetaminophen can be very dangerous and cause liver damage. Even if you do not have symptoms, it is important to get help right away. What side effects may I notice from receiving this medicine? Side effects that you should report to your doctor or health care professional as soon as possible: -allergic reactions like skin rash, itching or hives, swelling of the face, lips, or tongue -breathing problems -confusion -feeling faint or lightheaded, falls -redness, blistering, peeling or loosening of the skin, including inside the  mouth -seizure -stomach pain -yellowing of the eyes or skin Side effects that usually do not require medical attention (report to your doctor or health care professional if they continue or are bothersome): -constipation -nausea, vomiting This list may not describe all possible side effects. Call your doctor for medical advice about side effects. You may report side effects to FDA at 1-800-FDA-1088. Where should I keep my medicine? Keep out of the reach of children. This medicine can be abused. Keep your medicine in a safe place to protect it from theft. Do not share this medicine with anyone. Selling or giving away this medicine is dangerous and against the law. This medicine may cause accidental overdose and death if it taken by other adults, children, or pets. Mix any unused medicine with a substance like cat litter or coffee grounds. Then throw the medicine away in a sealed container like a sealed bag or a coffee can with a lid. Do not use the medicine after the expiration date. Store at room temperature between 15 and 30 degrees C (59 and 86 degrees F). NOTE: This sheet is a summary. It  may not cover all possible information. If you have questions about this medicine, talk to your doctor, pharmacist, or health care provider.  2018 Elsevier/Gold Standard (2013-11-01 15:00:25)

## 2018-05-28 ENCOUNTER — Encounter: Payer: Self-pay | Admitting: Neurology

## 2018-05-30 DIAGNOSIS — L821 Other seborrheic keratosis: Secondary | ICD-10-CM | POA: Diagnosis not present

## 2018-05-30 DIAGNOSIS — L814 Other melanin hyperpigmentation: Secondary | ICD-10-CM | POA: Diagnosis not present

## 2018-05-30 DIAGNOSIS — D225 Melanocytic nevi of trunk: Secondary | ICD-10-CM | POA: Diagnosis not present

## 2018-05-30 DIAGNOSIS — L570 Actinic keratosis: Secondary | ICD-10-CM | POA: Diagnosis not present

## 2018-05-30 DIAGNOSIS — Z86018 Personal history of other benign neoplasm: Secondary | ICD-10-CM | POA: Diagnosis not present

## 2018-06-27 ENCOUNTER — Encounter: Payer: Self-pay | Admitting: Neurology

## 2018-06-27 ENCOUNTER — Ambulatory Visit: Payer: 59 | Admitting: Neurology

## 2018-06-27 VITALS — BP 136/94 | HR 65 | Ht 73.0 in | Wt 223.0 lb

## 2018-06-27 DIAGNOSIS — R5383 Other fatigue: Secondary | ICD-10-CM | POA: Diagnosis not present

## 2018-06-27 DIAGNOSIS — R51 Headache with orthostatic component, not elsewhere classified: Secondary | ICD-10-CM

## 2018-06-27 DIAGNOSIS — H539 Unspecified visual disturbance: Secondary | ICD-10-CM

## 2018-06-27 DIAGNOSIS — R519 Headache, unspecified: Secondary | ICD-10-CM

## 2018-06-27 DIAGNOSIS — G030 Nonpyogenic meningitis: Secondary | ICD-10-CM

## 2018-06-27 DIAGNOSIS — B949 Sequelae of unspecified infectious and parasitic disease: Secondary | ICD-10-CM

## 2018-06-27 MED ORDER — RIZATRIPTAN BENZOATE 10 MG PO TBDP: 10.0000 mg | ORAL_TABLET | ORAL | 11 refills | Status: DC | PRN

## 2018-06-27 MED ORDER — TOPIRAMATE ER 50 MG PO CAP24: 50.0000 mg | ORAL_CAPSULE | Freq: Every day | ORAL | 0 refills | Status: DC

## 2018-06-27 MED FILL — RIZATRIPTAN 10 MG ODT: 10 | 30 days supply | Qty: 10 | Fill #0

## 2018-06-27 NOTE — Progress Notes (Signed)
[id number]    Provider:  Dr Jaynee Eagles Referring Provider: Rita Ohara, MD Primary Care Physician:  Rita Ohara, MD  CC:  Post-infectious headache  Interval history 06/27/2018: he is feeling more fatigued but still active. His vision is blurry, changing vision, physical exertion makes it worse. Headache worse with bending over, he wakes with headaches.+photo/phonophobia, sensory overload. In the past migraines were unilateral, pulsating different than these headaches more auras. Now he has more pressure, no hearing changes, He gets light headed, vestibular difficulty and vision changes. Motion makes it worse. Worsened last Tuesday and Wednesday. More frequent and longer. Discussed options, MRI of the brain, starting a preventative and choices, acute management. No fevers. His neck does hurt.   HPI:  Larry Cruz is a 47 y.o. male here as requested by Dr. Tomi Bamberger for headache. PMHx Migraines, viral meningitis. He had a severe headache, ended up with septic meningitis.  Headache started about a week prior to admission, went to the emergency room. Was treated in the hospital admitted august 3rd at its worst, nothing made it better or worse, but was the worst headache he is ever had it was severe and continuous.  Headache has significantly improved since being discharged however he has a residual headache.  He has a constant steady headache 1-2 continuous but often it gets 4-5 or 6.  The headache is in the frontal area and feels like pressure.  Prior to the headache, he is unsure if there was any inciting event he did have elbow injury and then Cortizone injection at elbow, then 6-day oral dose of steroids for elbow, at the end of the oral taper is when the headache started, more pressure, frontotemporal, no blurry vision, no neck pain.  He was around sick children he might have picked up a virus he is unsure.  Nothing like this is ever happened to him before. No other focal neurologic  deficits, associated symptoms, inciting events or modifiable factors.  Reviewed notes, labs and imaging from outside physicians, which showed: Reviewed notes from the emergency room, his admission his discharge.  Patient had been complaining of a headache for 1 week prior to going to the emergency room, he and his wife had a viral illness around the same time.  He had also been in contact with many people about 90 people the Saturday prior.  He had been on a steroid taper and had an elbow injection of cortisone as well.  He denied tick bites, mosquito bites but does work outdoors, he noted slight neck stiffness but denied nausea vomiting or significant fever.  He had completed 1 week course of doxycycline which was stopped due to his RMSF titer negative.   Reviewed images of CT of the head and CT of the head was unremarkable, reviewed labs and LP tube 1 showed 12 white blood cells, with 94% lymphs, protein 59, glucose 59, crypto antigen negative, Gram stain was negative, no organisms seen, he was admitted for viral meningitis.  HSV and VDRL during hospitalization came back negative.  He was given antivirals while in the hospital and provided supportive care, antivirals were discontinued when HSV came back negative.  He was discharged in improved condition.    Ct showed No acute intracranial abnormalities including mass lesion or mass effect, hydrocephalus, extra-axial fluid collection, midline shift, hemorrhage, or acute infarction, large ischemic events (personally reviewed images)    Review of Systems: Patient complains of symptoms per HPI as well as the following symptoms: headache. Pertinent  negatives and positives per HPI. All others negative.   Social History   Socioeconomic History  . Marital status: Married    Spouse name: Not on file  . Number of children: 2  . Years of education: Not on file  . Highest education level: Master's degree (e.g., MA, MS, MEng, MEd, MSW, MBA)  Occupational  History  . Occupation: Editor, commissioning: [employer]  . Financial resource strain: Not on file  . Food insecurity:    Worry: Not on file    Inability: Not on file  . Transportation needs:    Medical: Not on file    Non-medical: Not on file  Tobacco Use  . Smoking status: Never Smoker  . Smokeless tobacco: Never Used  Substance and Sexual Activity  . Alcohol use: Yes    Comment: 1 drink 3-4 times per week occasional  . Drug use: No  . Sexual activity: Yes    Partners: Female    Birth control/protection: Surgical    Comment: vasectomy  Lifestyle  . Physical activity:    Days per week: Not on file    Minutes per session: Not on file  . Stress: Not on file  Relationships  . Social connections:    Talks on phone: Not on file    Gets together: Not on file    Attends religious service: Not on file    Active member of club or organization: Not on file    Attends meetings of clubs or organizations: Not on file    Relationship status: Not on file  . Intimate partner violence:    Fear of current or ex partner: Not on file    Emotionally abused: Not on file    Physically abused: Not on file    Forced sexual activity: Not on file  Other Topics Concern  . Not on file  Social History Narrative   Lives with wife, daughter, son and dog   Right handed   Caffeine: 1 cup daily    Family History  Problem Relation Age of Onset  . Heart disease Father        CABG in mid 74's  . Hypertension Father   . Alzheimer's disease Father   . Hyperlipidemia Father   . Diabetes Father   . Hyperlipidemia Mother   . Hyperlipidemia Sister   . Hyperlipidemia Brother   . Hypertension Brother   . Hyperlipidemia Brother   . Hyperlipidemia Sister   . Melanoma Sister   . Breast cancer Sister 43  . Cancer Sister   . Hyperlipidemia Sister   . Breast cancer Sister 10  . Cancer Sister   . Hyperlipidemia Sister   . Hyperlipidemia Sister   .  Hyperlipidemia Sister   . Hyperlipidemia Sister   . Asthma Son     Past Medical History:  Diagnosis Date  . Colorblind   . Dyslipidemia   . 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 Low back pain   . Meningitis 2019   negative cultures    Past Surgical History:  Procedure Laterality Date  . CERVICAL DISC SURGERY  2009   JONES, MD; fusion  . LUMBAR DISC SURGERY  2000   CARTER, MD; L4-L5 (right)  . LUMBAR DISC SURGERY  11/2013   L4-5, Dr. Ronnald Ramp (left)  . VASECTOMY  11/09    Current Outpatient Medications  Medication Sig Dispense Refill  . atorvastatin (LIPITOR) 20 MG tablet Take 1 tablet (  20 mg total) by mouth daily. 90 tablet 3  . zolpidem (AMBIEN) 10 MG tablet TAKE 1 TABLET BY MOUTH NIGHTLY AT BEDTIME AS NEEDED 30 tablet 1  . rizatriptan (MAXALT-MLT) 10 MG disintegrating tablet Take 1 tablet (10 mg total) by mouth as needed for migraine. May repeat in 2 hours if needed 10 tablet 11  . Topiramate ER (TROKENDI XR) 50 MG CP24 Take 50 mg by mouth at bedtime. 30 capsule 0   No current facility-administered medications for this visit.     Allergies as of 06/27/2018  . (No Known Allergies)    Vitals: BP (!) 136/94 (BP Location: Right Arm, Patient Position: Sitting)   Pulse 65   Ht 6\' 1"  (1.854 m)   Wt 223 lb (101.2 kg)   BMI 29.42 kg/m   Weight 225 Last Weight:  Wt Readings from Last 1 Encounters:  06/27/18 223 lb (101.2 kg)   Last Height:   Ht Readings from Last 1 Encounters:  06/27/18 6\' 1"  (1.854 m)   Physical exam: Exam: Gen: NAD, conversant, well nourised, obese, well groomed                     CV: RRR, no MRG. No Carotid Bruits. No peripheral edema, warm, nontender Eyes: Conjunctivae clear without exudates or hemorrhage  Neuro: Detailed Neurologic Exam  Speech:    Speech is normal; fluent and spontaneous with normal comprehension.  Cognition:    The patient is oriented to person, place, and time;     recent and remote memory intact;     language fluent;      normal attention, concentration,     fund of knowledge Cranial Nerves:    The pupils are equal, round, and reactive to light. The fundi are normal and spontaneous venous pulsations are present. Visual fields are full to finger confrontation. Extraocular movements are intact. Trigeminal sensation is intact and the muscles of mastication are normal. The face is symmetric. The palate elevates in the midline. Hearing intact. Voice is normal. Shoulder shrug is normal. The tongue has normal motion without fasciculations.   Coordination:    Normal finger to nose and heel to shin. Normal rapid alternating movements.   Gait:    Heel-toe and tandem gait are normal.   Motor Observation:    No asymmetry, no atrophy, and no involuntary movements noted. Tone:    Normal muscle tone.    Posture:    Posture is normal. normal erect    Strength:    Strength is V/V in the upper and lower limbs.      Sensation: intact to LT     Reflex Exam:  DTR's:    Deep tendon reflexes in the upper and lower extremities are normal bilaterally.   Toes:    The toes are downgoing bilaterally.   Clonus:    Clonus is absent.       Assessment/Plan: This is a 47 year old male who was recently admitted for aseptic meningitis, LP showed elevated white blood cells predominantly lymphocytic, HSV PCR CSF was negative.  He has a continued headache likely postinfectious headache however given his concerning symptoms need further workup.   MRI brain w/wo contrast due to persistent worsening headaches which are positional, nocturnal and in the morning, vision changes. Given his meningitis need to evaluate for post-meningitis intracranial hypertension, hydrocephalus, infarction, cerebritis, or persistent meningitis or any other etiologies.  Start a preventative: Trokendi: 25mg  at bedtime for one week then increase to 50mg  nightly.  After about 2 weeks on 50mg  give me an email we will decide whether to keep at 50 or increase to  100mg .   Fioricet and tramadol not helping, for acute management try: Maxalt (Rizatriptan): Please take one tablet at the onset of your headache. If it does not improve the symptoms please take one additional tablet. Do not take more then 2 tablets in 24hrs. Do not take use more then 2 to 3 times in a week.  Zofran (ondansetron) for nausea or dizziness/vestibular symptoms  Orders Placed This Encounter  Procedures  . MR BRAIN W WO CONTRAST   Meds ordered this encounter  Medications  . Topiramate ER (TROKENDI XR) 50 MG CP24    Sig: Take 50 mg by mouth at bedtime.    Dispense:  30 capsule    Refill:  0  . rizatriptan (MAXALT-MLT) 10 MG disintegrating tablet    Sig: Take 1 tablet (10 mg total) by mouth as needed for migraine. May repeat in 2 hours if needed    Dispense:  10 tablet    Refill:  11     Discussed: To prevent or relieve headaches, try the following: Cool Compress. Lie down and place a cool compress on your head.  Avoid headache triggers. If certain foods or odors seem to have triggered your migraines in the past, avoid them. A headache diary might help you identify triggers.  Include physical activity in your daily routine. Try a daily walk or other moderate aerobic exercise.  Manage stress. Find healthy ways to cope with the stressors, such as delegating tasks on your to-do list.  Practice relaxation techniques. Try deep breathing, yoga, massage and visualization.  Eat regularly. Eating regularly scheduled meals and maintaining a healthy diet might help prevent headaches. Also, drink plenty of fluids.  Follow a regular sleep schedule. Sleep deprivation might contribute to headaches Consider biofeedback. With this mind-body technique, you learn to control certain bodily functions - such as muscle tension, heart rate and blood pressure - to prevent headaches or reduce headache pain.    Proceed to emergency room if you experience new or worsening symptoms or symptoms do not  resolve, if you have new neurologic symptoms or if headache is severe, or for any concerning symptom.    Sarina Ill, MD  [facility] [address] [place], [place] [address]  Phone [phone] Fax [phone]  A total of 45 minutes was spent face-to-face with this patient. Over half this time was spent on counseling patient on the  1. Acute intractable headache, unspecified headache type   2. Post-infection fatigue   3. Aseptic meningitis   4. Positional headache   5. Nocturnal headaches   6. Morning headache   7. Vision changes     diagnosis and different diagnostic and therapeutic options, counseling and coordination of care, risks ans benefits of management, compliance, or risk factor reduction and education.

## 2018-06-27 NOTE — Patient Instructions (Addendum)
Trokendi: 25mg  at bedtime for one week then increase to 50mg  nightly. After about 2 weeks on 50mg  give me an email we will decide whether to keep at 50 or increase to 100mg .  MRI of the brain w/wo contrast: we will call to schedule   Maxalt (Rizatriptan): Please take one tablet at the onset of your headache. If it does not improve the symptoms please take one additional tablet. Do not take more then 2 tablets in 24hrs. Do not take use more then 2 to 3 times in a week.  Zofran (ondansetron) for nausea or dizziness/vestibular symptoms    Topiramate extended-release capsules What is this medicine? TOPIRAMATE (toe PYRE a mate) is used to treat seizures in adults or children with epilepsy. It is also used for the prevention of migraine headaches. This medicine may be used for other purposes; ask your health care provider or pharmacist if you have questions. COMMON BRAND NAME(S): Trokendi XR What should I tell my health care provider before I take this medicine? They need to know if you have any of these conditions: -cirrhosis of the liver or liver disease -diarrhea -glaucoma -kidney stones or kidney disease -lung disease like asthma, obstructive pulmonary disease, emphysema -metabolic acidosis -on a ketogenic diet -scheduled for surgery or a procedure -suicidal thoughts, plans, or attempt; a previous suicide attempt by you or a family member -an unusual or allergic reaction to topiramate, other medicines, foods, dyes, or preservatives -pregnant or trying to get pregnant -breast-feeding How should I use this medicine? Take this medicine by mouth with a glass of water. Follow the directions on the prescription label. Trokendi XR capsules must be swallowed whole. Do not sprinkle on food, break, crush, dissolve, or chew. Qudexy XR capsules may be swallowed whole or opened and sprinkled on a small amount of soft food. This mixture must be swallowed immediately. Do not chew or store mixture for  later use. You may take this medicine with meals. Take your medicine at regular intervals. Do not take it more often than directed. Talk to your pediatrician regarding the use of this medicine in children. Special care may be needed. While Trokendi XR may be prescribed for children as young as 6 years and Qudexy XR may be prescribed for children as young as 2 years for selected conditions, precautions do apply. Overdosage: If you think you have taken too much of this medicine contact a poison control center or emergency room at once. NOTE: This medicine is only for you. Do not share this medicine with others. What if I miss a dose? If you miss a dose, take it as soon as you can. If it is almost time for your next dose, take only that dose. Do not take double or extra doses. What may interact with this medicine? Do not take this medicine with any of the following medications: -probenecid This medicine may also interact with the following medications: -acetazolamide -alcohol -amitriptyline -birth control pills -digoxin -hydrochlorothiazide -lithium -medicines for pain, sleep, or muscle relaxation -metformin -methazolamide -other seizure or epilepsy medicines -pioglitazone -risperidone This list may not describe all possible interactions. Give your health care provider a list of all the medicines, herbs, non-prescription drugs, or dietary supplements you use. Also tell them if you smoke, drink alcohol, or use illegal drugs. Some items may interact with your medicine. What should I watch for while using this medicine? Visit your doctor or health care professional for regular checks on your progress. Do not stop taking this medicine suddenly.  This increases the risk of seizures if you are using this medicine to control epilepsy. Wear a medical identification bracelet or chain to say you have epilepsy or seizures, and carry a card that lists all your medicines. This medicine can decrease sweating  and increase your body temperature. Watch for signs of deceased sweating or fever, especially in children. Avoid extreme heat, hot baths, and saunas. Be careful about exercising, especially in hot weather. Contact your health care provider right away if you notice a fever or decrease in sweating. You should drink plenty of fluids while taking this medicine. If you have had kidney stones in the past, this will help to reduce your chances of forming kidney stones. If you have stomach pain, with nausea or vomiting and yellowing of your eyes or skin, call your doctor immediately. You may get drowsy, dizzy, or have blurred vision. Do not drive, use machinery, or do anything that needs mental alertness until you know how this medicine affects you. To reduce dizziness, do not sit or stand up quickly, especially if you are an older patient. Alcohol can increase drowsiness and dizziness. Avoid alcoholic drinks. Do not drink alcohol for 6 hours before or 6 hours after taking Trokendi XR. If you notice blurred vision, eye pain, or other eye problems, seek medical attention at once for an eye exam. The use of this medicine may increase the chance of suicidal thoughts or actions. Pay special attention to how you are responding while on this medicine. Any worsening of mood, or thoughts of suicide or dying should be reported to your health care professional right away. This medicine may increase the chance of developing metabolic acidosis. If left untreated, this can cause kidney stones, bone disease, or slowed growth in children. Symptoms include breathing fast, fatigue, loss of appetite, irregular heartbeat, or loss of consciousness. Call your doctor immediately if you experience any of these side effects. Also, tell your doctor about any surgery you plan on having while taking this medicine since this may increase your risk for metabolic acidosis. Birth control pills may not work properly while you are taking this  medicine. Talk to your doctor about using an extra method of birth control. Women who become pregnant while using this medicine may enroll in the [place] Pregnancy Registry by calling [phone]. This registry collects information about the safety of antiepileptic drug use during pregnancy. What side effects may I notice from receiving this medicine? Side effects that you should report to your doctor or health care professional as soon as possible: -allergic reactions like skin rash, itching or hives, swelling of the face, lips, or tongue -decreased sweating and/or rise in body temperature -depression -difficulty breathing, fast or irregular breathing patterns -difficulty speaking -difficulty walking or controlling muscle movements -hearing impairment -redness, blistering, peeling or loosening of the skin, including inside the mouth -tingling, pain or numbness in the hands or feet -unusually weak or tired -worsening of mood, thoughts or actions of suicide or dying Side effects that usually do not require medical attention (report to your doctor or health care professional if they continue or are bothersome): -altered taste -back pain, joint or muscle aches and pains -diarrhea, or constipation -headache -loss of appetite -nausea -stomach upset, indigestion -tremors This list may not describe all possible side effects. Call your doctor for medical advice about side effects. You may report side effects to FDA at 1-800-FDA-1088. Where should I keep my medicine? Keep out of the reach of children. Store  at room temperature between 15 and 30 degrees C (59 and 86 degrees F) in a tightly closed container. Protect from moisture. Throw away any unused medicine after the expiration date. NOTE: This sheet is a summary. It may not cover all possible information. If you have questions about this medicine, talk to your doctor, pharmacist, or health care provider.  2018  Elsevier/Gold Standard (2015-12-25 12:33:11)  Rizatriptan tablets What is this medicine? RIZATRIPTAN (rye za TRIP tan) is used to treat migraines with or without aura. An aura is a strange feeling or visual disturbance that warns you of an attack. It is not used to prevent migraines. This medicine may be used for other purposes; ask your health care provider or pharmacist if you have questions. COMMON BRAND NAME(S): Maxalt What should I tell my health care provider before I take this medicine? They need to know if you have any of these conditions: -bowel disease or colitis -diabetes -family history of heart disease -fast or irregular heart beat -heart or blood vessel disease, angina (chest pain), or previous heart attack -high blood pressure -high cholesterol -history of stroke, transient ischemic attacks (TIAs or mini-strokes), or intracranial bleeding -kidney or liver disease -overweight -poor circulation -postmenopausal or surgical removal of uterus and ovaries -Raynaud's disease -seizure disorder -an unusual or allergic reaction to rizatriptan, other medicines, foods, dyes, or preservatives -pregnant or trying to get pregnant -breast-feeding How should I use this medicine? This medicine is taken by mouth with a glass of water. Follow the directions on the prescription label. This medicine is taken at the first symptoms of a migraine. It is not for everyday use. If your migraine headache returns after one dose, you can take another dose as directed. You must leave at least 2 hours between doses, and do not take more than 30 mg total in 24 hours. If there is no improvement at all after the first dose, do not take a second dose without talking to your doctor or health care professional. Do not take your medicine more often than directed. Talk to your pediatrician regarding the use of this medicine in children. While this drug may be prescribed for children as young as 6 years for  selected conditions, precautions do apply. Overdosage: If you think you have taken too much of this medicine contact a poison control center or emergency room at once. NOTE: This medicine is only for you. Do not share this medicine with others. What if I miss a dose? This does not apply; this medicine is not for regular use. What may interact with this medicine? Do not take this medicine with any of the following medicines: -amphetamine, dextroamphetamine or cocaine -dihydroergotamine, ergotamine, ergoloid mesylates, methysergide, or ergot-type medication - do not take within 24 hours of taking rizatriptan -feverfew -MAOIs like Carbex, Eldepryl, Marplan, Nardil, and Parnate - do not take rizatriptan within 2 weeks of stopping MAOI therapy. -other migraine medicines like almotriptan, eletriptan, naratriptan, sumatriptan, zolmitriptan - do not take within 24 hours of taking rizatriptan -tryptophan This medicine may also interact with the following medications: -medicines for mental depression, anxiety or mood problems -propranolol This list may not describe all possible interactions. Give your health care provider a list of all the medicines, herbs, non-prescription drugs, or dietary supplements you use. Also tell them if you smoke, drink alcohol, or use illegal drugs. Some items may interact with your medicine. What should I watch for while using this medicine? Only take this medicine for a migraine headache. Take it  if you get warning symptoms or at the start of a migraine attack. It is not for regular use to prevent migraine attacks. You may get drowsy or dizzy. Do not drive, use machinery, or do anything that needs mental alertness until you know how this medicine affects you. To reduce dizzy or fainting spells, do not sit or stand up quickly, especially if you are an older patient. Alcohol can increase drowsiness, dizziness and flushing. Avoid alcoholic drinks. Smoking cigarettes may increase  the risk of heart-related side effects from using this medicine. If you take migraine medicines for 10 or more days a month, your migraines may get worse. Keep a diary of headache days and medicine use. Contact your healthcare professional if your migraine attacks occur more frequently. What side effects may I notice from receiving this medicine? Side effects that you should report to your doctor or health care professional as soon as possible: -allergic reactions like skin rash, itching or hives, swelling of the face, lips, or tongue -fast, slow, or irregular heart beat -increased or decreased blood pressure -seizures -severe stomach pain and cramping, bloody diarrhea -signs and symptoms of a blood clot such as breathing problems; changes in vision; chest pain; severe, sudden headache; pain, swelling, warmth in the leg; trouble speaking; sudden numbness or weakness of the face, arm or leg -tingling, pain, or numbness in the face, hands, or feet Side effects that usually do not require medical attention (report to your doctor or health care professional if they continue or are bothersome): -drowsiness -dry mouth -feeling warm, flushing, or redness of the face -headache -muscle cramps, pain -nausea, vomiting -unusually weak or tired This list may not describe all possible side effects. Call your doctor for medical advice about side effects. You may report side effects to FDA at 1-800-FDA-1088. Where should I keep my medicine? Keep out of the reach of children. Store at room temperature between 15 and 30 degrees C (59 and 86 degrees F). Keep container tightly closed. Throw away any unused medicine after the expiration date. NOTE: This sheet is a summary. It may not cover all possible information. If you have questions about this medicine, talk to your doctor, pharmacist, or health care provider.  2018 Elsevier/Gold Standard (2013-05-07 10:16:39)

## 2018-06-28 ENCOUNTER — Telehealth: Payer: Self-pay | Admitting: Neurology

## 2018-06-28 NOTE — Telephone Encounter (Signed)
MR Brain w/wo contrast Dr. Lianne Bushy [facility] Auth: [place] Ref # [id number]. Patient is scheduled at [facility] for 07/10/18.

## 2018-07-09 ENCOUNTER — Other Ambulatory Visit: Payer: Self-pay | Admitting: Family Medicine

## 2018-07-09 MED FILL — VALACYCLOVIR HCL 500 MG TAB: 500 | 15 days supply | Qty: 30 | Fill #0

## 2018-07-09 NOTE — Telephone Encounter (Signed)
Is this okay to refill? 

## 2018-07-10 ENCOUNTER — Ambulatory Visit: Payer: 59

## 2018-07-10 DIAGNOSIS — H539 Unspecified visual disturbance: Secondary | ICD-10-CM | POA: Diagnosis not present

## 2018-07-10 DIAGNOSIS — R519 Headache, unspecified: Secondary | ICD-10-CM

## 2018-07-10 DIAGNOSIS — G030 Nonpyogenic meningitis: Secondary | ICD-10-CM | POA: Diagnosis not present

## 2018-07-10 DIAGNOSIS — R51 Headache with orthostatic component, not elsewhere classified: Secondary | ICD-10-CM

## 2018-07-11 MED ORDER — GADOBENATE DIMEGLUMINE 529 MG/ML IV SOLN
15.0000 mL | Freq: Once | INTRAVENOUS | Status: AC | PRN
  Administered 2018-07-10: 15 mL via INTRAVENOUS

## 2018-07-17 ENCOUNTER — Ambulatory Visit (INDEPENDENT_AMBULATORY_CARE_PROVIDER_SITE_OTHER): Payer: 59 | Admitting: Neurology

## 2018-07-17 VITALS — BP 121/84 | HR 72

## 2018-07-17 DIAGNOSIS — G030 Nonpyogenic meningitis: Secondary | ICD-10-CM

## 2018-07-17 DIAGNOSIS — R519 Headache, unspecified: Secondary | ICD-10-CM | POA: Insufficient documentation

## 2018-07-17 DIAGNOSIS — R51 Headache: Secondary | ICD-10-CM

## 2018-07-17 MED ORDER — GALCANEZUMAB-GNLM 120 MG/ML ~~LOC~~ SOAJ: 120.0000 mg | SUBCUTANEOUS | 0 refills | Status: DC

## 2018-07-17 NOTE — Progress Notes (Signed)
Reviewed MRI brain:  IMPRESSION:   MRI brain (with and without) demonstrating: - Small nonspecific foci of gliosis in the left parietal subcortical white matter.  No abnormal lesions are seen on post contrast views.   - Incidental posterior fossa arachnoid cyst. - No acute findings.  Can try Emgality samples. Also discussed botox for migraines.  Meds ordered this encounter  Medications  . Galcanezumab-gnlm (EMGALITY) 120 MG/ML SOAJ    Sig: Inject 120 mg into the skin every 30 (thirty) days.    Dispense:  6 pen    Refill:  0    [id number] aa 4/21

## 2018-08-21 ENCOUNTER — Encounter: Payer: Self-pay | Admitting: *Deleted

## 2018-08-21 ENCOUNTER — Other Ambulatory Visit: Payer: Self-pay

## 2018-08-21 ENCOUNTER — Encounter: Payer: Self-pay | Admitting: Family Medicine

## 2018-08-21 DIAGNOSIS — G4700 Insomnia, unspecified: Secondary | ICD-10-CM

## 2018-08-21 MED ORDER — TOPIRAMATE ER 100 MG PO CAP24: 100.0000 mg | ORAL_CAPSULE | Freq: Every day | ORAL | 11 refills | Status: DC

## 2018-08-21 MED ORDER — ZOLPIDEM TARTRATE 10 MG PO TABS: ORAL_TABLET | ORAL | 1 refills | Status: DC

## 2018-08-21 MED FILL — ZOLPIDEM TARTRATE 10 MG TAB: 10 | 30 days supply | Qty: 30 | Fill #0

## 2018-08-21 NOTE — Telephone Encounter (Signed)
Per Dr. Jaynee Eagles, order Trokendi XR 100 mg QHS. Order placed.

## 2018-08-22 ENCOUNTER — Telehealth: Payer: Self-pay | Admitting: *Deleted

## 2018-08-22 NOTE — Telephone Encounter (Signed)
Completed Trokendi XR 100 mg PA on Cover My Meds. KEY: [id number]. Anticipate determination within 24 hours.   To check for an update later, open this request again from your dashboard. If [employer] has not replied within 24 hours for urgent requests or within 48 hours for standard requests, please contact [employer] at [phone].

## 2018-08-23 DIAGNOSIS — H52202 Unspecified astigmatism, left eye: Secondary | ICD-10-CM | POA: Diagnosis not present

## 2018-08-23 DIAGNOSIS — H524 Presbyopia: Secondary | ICD-10-CM | POA: Diagnosis not present

## 2018-08-23 DIAGNOSIS — H5213 Myopia, bilateral: Secondary | ICD-10-CM | POA: Diagnosis not present

## 2018-08-24 ENCOUNTER — Ambulatory Visit (INDEPENDENT_AMBULATORY_CARE_PROVIDER_SITE_OTHER): Payer: Self-pay | Admitting: *Deleted

## 2018-08-24 ENCOUNTER — Other Ambulatory Visit: Payer: Self-pay | Admitting: *Deleted

## 2018-08-24 DIAGNOSIS — Z0289 Encounter for other administrative examinations: Secondary | ICD-10-CM

## 2018-08-24 DIAGNOSIS — Z79899 Other long term (current) drug therapy: Secondary | ICD-10-CM

## 2018-08-24 MED ORDER — TOPIRAMATE ER 100 MG PO CAP24: ORAL_CAPSULE | ORAL | 0 refills | Status: DC

## 2018-08-24 MED ORDER — TOPIRAMATE ER 25 MG PO CAP24: ORAL_CAPSULE | ORAL | 0 refills | Status: DC

## 2018-08-24 NOTE — Progress Notes (Signed)
Pt given samples of Trokendi XR 25 mg and 100 mg capsules. Dose is now 150 mg oral daily at bedtime per Dr. Jaynee Eagles who d/w patient. Pt educated.

## 2018-08-30 NOTE — Telephone Encounter (Signed)
Trokendi XR denied. Our guideline named STANDARD STEP THERAPY requires that you have tried preferred options before receiving coverage for this drug. In order for your request to be approved, your provider needs to tell us that you have tried the step therapies listed below. Your provider may give a reason why you cannot take our suggested step therapies, including a statement that these therapies would not work as well or could cause side effects. Approval requires you to try: topiramate immediate release tablets. This request has been denied because we do not have information showing you have tried the required step therapy medication.  Dr. Jaynee Eagles aware. Pt currently has samples.

## 2018-09-05 ENCOUNTER — Other Ambulatory Visit: Payer: 59

## 2018-09-05 DIAGNOSIS — E7800 Pure hypercholesterolemia, unspecified: Secondary | ICD-10-CM | POA: Diagnosis not present

## 2018-09-05 DIAGNOSIS — Z5181 Encounter for therapeutic drug level monitoring: Secondary | ICD-10-CM

## 2018-09-05 LAB — LIPID PANEL
Chol/HDL Ratio: 5 ratio (ref 0.0–5.0)
Cholesterol, Total: 221 mg/dL — ABNORMAL HIGH (ref 100–199)
HDL: 44 mg/dL (ref >39)
LDL Calculated: 141 mg/dL — ABNORMAL HIGH (ref 0–99)
Triglycerides: 180 mg/dL — ABNORMAL HIGH (ref 0–149)
VLDL Cholesterol Cal: 36 mg/dL (ref 5–40)

## 2018-09-06 ENCOUNTER — Encounter: Payer: Self-pay | Admitting: Family Medicine

## 2018-09-06 MED ORDER — ATORVASTATIN CALCIUM 40 MG PO TABS: 40.0000 mg | ORAL_TABLET | Freq: Every day | ORAL | 0 refills | Status: DC

## 2018-09-06 MED FILL — ATORVASTATIN 40 MG TABLET: 40 | 90 days supply | Qty: 90 | Fill #0

## 2018-09-12 ENCOUNTER — Other Ambulatory Visit: Payer: Self-pay | Admitting: Neurology

## 2018-09-12 MED ORDER — TOPIRAMATE 50 MG PO TABS: 150.0000 mg | ORAL_TABLET | Freq: Every day | ORAL | 11 refills | Status: DC

## 2018-09-13 MED FILL — TOPIRAMATE 50 MG TABLET: 50 | 30 days supply | Qty: 90 | Fill #0

## 2018-09-26 ENCOUNTER — Other Ambulatory Visit: Payer: Self-pay | Admitting: Neurology

## 2018-09-26 MED ORDER — PREDNISONE 20 MG PO TABS: 60.0000 mg | ORAL_TABLET | Freq: Every day | ORAL | 0 refills | Status: DC

## 2018-09-26 MED FILL — predniSONE 20 MG TABS: 20 | 5 days supply | Qty: 15 | Fill #0

## 2018-09-28 ENCOUNTER — Other Ambulatory Visit: Payer: Self-pay | Admitting: Neurology

## 2018-09-28 MED FILL — predniSONE 20 MG TABS: 20 | 1 days supply | Qty: 3 | Fill #0

## 2018-10-02 ENCOUNTER — Encounter

## 2018-10-02 ENCOUNTER — Ambulatory Visit: Payer: 59 | Admitting: Sports Medicine

## 2018-10-02 ENCOUNTER — Encounter: Payer: Self-pay | Admitting: Sports Medicine

## 2018-10-02 VITALS — BP 144/97 | Ht 73.0 in | Wt 220.0 lb

## 2018-10-02 DIAGNOSIS — M25521 Pain in right elbow: Secondary | ICD-10-CM | POA: Diagnosis not present

## 2018-10-02 DIAGNOSIS — M7711 Lateral epicondylitis, right elbow: Secondary | ICD-10-CM | POA: Diagnosis not present

## 2018-10-03 NOTE — Progress Notes (Signed)
   Subjective:    Patient ID: Larry Cruz, male    DOB: 06/13/1971, 47 y.o.   MRN: [id number]  HPI   Patient comes in today with persistent right elbow pain.  Pain has been present now for several months.  He has failed conservative treatment thus far including cortisone injection and home exercises.  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  The prednisone does help his elbow.  Pain is primarily along the lateral elbow but at times will be more diffuse.  It is most noticeable with gripping objects with the right hand.  It also bothers him at night while trying to sleep.   Review of Systems As above    Objective:   Physical Exam  Well-developed, well-nourished.  No acute distress.  Awake alert and oriented x3.  Vital signs reviewed  Right elbow: Full range of motion.  No effusion.  There is no soft tissue swelling.  He is tender to palpation over the lateral epicondyle with reproducible pain with ECRB testing.  No tenderness over the medial epicondyle.  No tenderness at the biceps tendon.  Good pulses.  Slightly decreased grip strength secondary to pain.      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  Symptoms are worrisome enough that he would consider surgery if needed.  Therefore, we are going to proceed with an MRI specifically to further evaluate the common extensor tendon.  Phone follow-up with those results when available.  We will delineate definitive treatment based on those findings.

## 2018-10-04 ENCOUNTER — Ambulatory Visit
Admission: RE | Admit: 2018-10-04 | Discharge: 2018-10-04 | Disposition: A | Discharge status: Home / Self Care | Payer: 59 | Source: Ambulatory Visit | Attending: Sports Medicine | Admitting: Sports Medicine

## 2018-10-04 DIAGNOSIS — M25521 Pain in right elbow: Secondary | ICD-10-CM

## 2018-10-04 DIAGNOSIS — S53441A Ulnar collateral ligament sprain of right elbow, initial encounter: Secondary | ICD-10-CM | POA: Diagnosis not present

## 2018-10-09 ENCOUNTER — Telehealth: Payer: Self-pay | Admitting: Sports Medicine

## 2018-10-09 NOTE — Telephone Encounter (Signed)
  I spoke with the patient on the phone today after reviewing MRI findings of his right elbow.  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  He has failed conservative treatment to date including physical therapy and a recent cortisone injection.  He continues to have significant pain.  Therefore, I will refer him to Dr. Laurelyn Sickle to discuss further treatment options.  Follow-up with me as needed.

## 2018-10-11 ENCOUNTER — Ambulatory Visit: Payer: 59 | Admitting: Neurology

## 2018-10-11 ENCOUNTER — Encounter: Payer: Self-pay | Admitting: Neurology

## 2018-10-11 VITALS — BP 138/88 | HR 67 | Ht 73.0 in | Wt 222.0 lb

## 2018-10-11 DIAGNOSIS — G039 Meningitis, unspecified: Secondary | ICD-10-CM | POA: Diagnosis not present

## 2018-10-11 DIAGNOSIS — G932 Benign intracranial hypertension: Secondary | ICD-10-CM | POA: Diagnosis not present

## 2018-10-11 DIAGNOSIS — R4701 Aphasia: Secondary | ICD-10-CM

## 2018-10-11 DIAGNOSIS — G43719 Chronic migraine without aura, intractable, without status migrainosus: Secondary | ICD-10-CM

## 2018-10-11 DIAGNOSIS — R51 Headache: Secondary | ICD-10-CM | POA: Diagnosis not present

## 2018-10-11 DIAGNOSIS — G441 Vascular headache, not elsewhere classified: Secondary | ICD-10-CM | POA: Diagnosis not present

## 2018-10-11 DIAGNOSIS — B349 Viral infection, unspecified: Secondary | ICD-10-CM | POA: Diagnosis not present

## 2018-10-11 DIAGNOSIS — R419 Unspecified symptoms and signs involving cognitive functions and awareness: Secondary | ICD-10-CM | POA: Diagnosis not present

## 2018-10-11 DIAGNOSIS — R519 Headache, unspecified: Secondary | ICD-10-CM

## 2018-10-11 MED ORDER — TOPIRAMATE 50 MG PO TABS: 50.0000 mg | ORAL_TABLET | Freq: Every day | ORAL | 11 refills | Status: DC

## 2018-10-11 MED ORDER — KETOROLAC TROMETHAMINE 60 MG/2ML IM SOLN
60.0000 mg | Freq: Once | INTRAMUSCULAR | Status: AC
  Administered 2018-10-11: 60 mg via INTRAMUSCULAR

## 2018-10-11 NOTE — Progress Notes (Signed)
Botox- 200 units x 1 vial Lot: [id number] Expiration: 07/2020 NDC: 6270-3500-93  Bacteriostatic 0.9% Sodium Chloride- 17mL total Lot: [id number] Expiration: 06/20/2019 NDC: 3716-9678-93  Samples used  Consent signed  16:30 pt given Toradol 60 mg IM per v.o. Dr. Jaynee Eagles. Administered 30 mg/mL in each deltoid. Pt tolerated well and aseptic technique used.

## 2018-10-11 NOTE — Progress Notes (Addendum)
[facility]    Provider:  Dr Jaynee Eagles Referring Provider: Rita Ohara, MD Primary Care Physician:  Rita Ohara, MD  CC:  Post-infectious headache  Interval history 10/14/2018: Patient continues to have headaches. He is having significant aphasia, difficulty getting words out, alteration of awareness His headaches are a "roller coaster" and some days are worse.  He has a headache and fogginess. He is having a hard time focusing and getting his words out.  The Topamax didn't help. Fr the last 3-4 months gradual improvement but status quo. Daily headache and variable. No worsening.  Constant pressure bilaterally. +migrainous. He has daily headaches and 15/30 days can be migrainous with +pulsating,light and sound sensitivity and nausea but the worse is the pressure. Steroids helped a little He has had a lot of pain issues with his elbows and GERD. He in inundated with pain. He tried Topamax 150mg  and no improvement. He does not take Ambien every night. No aura. No medication overuse. Ongoing at this frequency for > 6 months.   Interval history 06/27/2018: he is feeling more fatigued but still active. His vision is blurry, changing vision, physical exertion makes it worse. Headache worse with bending over, he wakes with headaches.+photo/phonophobia, sensory overload. In the past migraines were unilateral, pulsating different than these headaches more auras. Now he has more pressure, no hearing changes, He gets light headed, vestibular difficulty and vision changes. Motion makes it worse. Worsened last Tuesday and Wednesday. More frequent and longer. Discussed options, MRI of the brain, starting a preventative and choices, acute management. No fevers. His neck does hurt.   HPI:  Larry Cruz is a 48 y.o. male here as requested by Dr. Tomi Bamberger for headache. PMHx Migraines, viral meningitis. He had a severe headache, ended up with septic meningitis.  Headache started about a week prior to  admission, went to the emergency room. Was treated in the hospital admitted august 3rd at its worst, nothing made it better or worse, but was the worst headache he is ever had it was severe and continuous.  Headache has significantly improved since being discharged however he has a residual headache.  He has a constant steady headache 1-2 continuous but often it gets 4-5 or 6.  The headache is in the frontal area and feels like pressure.  Prior to the headache, he is unsure if there was any inciting event he did have elbow injury and then Cortizone injection at elbow, then 6-day oral dose of steroids for elbow, at the end of the oral taper is when the headache started, more pressure, frontotemporal, no blurry vision, no neck pain.  He was around sick children he might have picked up a virus he is unsure.  Nothing like this is ever happened to him before. No other focal neurologic deficits, associated symptoms, inciting events or modifiable factors.  Reviewed notes, labs and imaging from outside physicians, which showed: Reviewed notes from the emergency room, his admission his discharge.  Patient had been complaining of a headache for 1 week prior to going to the emergency room, he and his wife had a viral illness around the same time.  He had also been in contact with many people about 90 people the Saturday prior.  He had been on a steroid taper and had an elbow injection of cortisone as well.  He denied tick bites, mosquito bites but does work outdoors, he noted slight neck stiffness but denied nausea vomiting or significant fever.  He had completed 1 week  course of doxycycline which was stopped due to his RMSF titer negative.   Reviewed images of CT of the head and CT of the head was unremarkable, reviewed labs and LP tube 1 showed 12 white blood cells, with 94% lymphs, protein 59, glucose 59, crypto antigen negative, Gram stain was negative, no organisms seen, he was admitted for viral meningitis.  HSV and  VDRL during hospitalization came back negative.  He was given antivirals while in the hospital and provided supportive care, antivirals were discontinued when HSV came back negative.  He was discharged in improved condition.    Ct showed No acute intracranial abnormalities including mass lesion or mass effect, hydrocephalus, extra-axial fluid collection, midline shift, hemorrhage, or acute infarction, large ischemic events (personally reviewed images)    Review of Systems: Patient complains of symptoms per HPI as well as the following symptoms: headache. Pertinent negatives and positives per HPI. All others negative.   Social History   Socioeconomic History  . Marital status: Married    Spouse name: Not on file  . Number of children: 2  . Years of education: Not on file  . Highest education level: Master's degree (e.g., MA, MS, MEng, MEd, MSW, MBA)  Occupational History  . Occupation: Editor, commissioning: [employer]  . Financial resource strain: Not on file  . Food insecurity:    Worry: Not on file    Inability: Not on file  . Transportation needs:    Medical: Not on file    Non-medical: Not on file  Tobacco Use  . Smoking status: Never Smoker  . Smokeless tobacco: Never Used  Substance and Sexual Activity  . Alcohol use: Not Currently    Comment: 1 drink 3-4 times per week occasional  . Drug use: No  . Sexual activity: Yes    Partners: Female    Birth control/protection: Surgical    Comment: vasectomy  Lifestyle  . Physical activity:    Days per week: Not on file    Minutes per session: Not on file  . Stress: Not on file  Relationships  . Social connections:    Talks on phone: Not on file    Gets together: Not on file    Attends religious service: Not on file    Active member of club or organization: Not on file    Attends meetings of clubs or organizations: Not on file    Relationship status: Not on file  . Intimate  partner violence:    Fear of current or ex partner: Not on file    Emotionally abused: Not on file    Physically abused: Not on file    Forced sexual activity: Not on file  Other Topics Concern  . Not on file  Social History Narrative   Lives with wife, daughter, son and dog   Right handed   Caffeine: not regular right now    Family History  Problem Relation Age of Onset  . Heart disease Father        CABG in mid 11's  . Hypertension Father   . Alzheimer's disease Father   . Hyperlipidemia Father   . Diabetes Father   . Hyperlipidemia Mother   . Hyperlipidemia Sister   . Hyperlipidemia Brother   . Hypertension Brother   . Hyperlipidemia Brother   . Hyperlipidemia Sister   . Melanoma Sister   . Breast cancer Sister 53  . Cancer Sister   .  Hyperlipidemia Sister   . Breast cancer Sister 84  . Cancer Sister   . Hyperlipidemia Sister   . Hyperlipidemia Sister   . Hyperlipidemia Sister   . Hyperlipidemia Sister   . Asthma Son     Past Medical History:  Diagnosis Date  . Aseptic meningitis   . Colorblind   . Dyslipidemia   . Headache   . 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 Low back pain   . Meningitis 2019   negative cultures  . Torn tendon    R elbow    Past Surgical History:  Procedure Laterality Date  . CERVICAL DISC SURGERY  2009   JONES, MD; fusion  . LUMBAR DISC SURGERY  2000   CARTER, MD; L4-L5 (right)  . LUMBAR DISC SURGERY  11/2013   L4-5, Dr. Ronnald Ramp (left)  . VASECTOMY  11/09    Current Outpatient Medications  Medication Sig Dispense Refill  . atorvastatin (LIPITOR) 40 MG tablet Take 1 tablet (40 mg total) by mouth daily. 90 tablet 0  . topiramate (TOPAMAX) 50 MG tablet Take 1 tablet (50 mg total) by mouth at bedtime. 30 tablet 11  . zolpidem (AMBIEN) 10 MG tablet TAKE 1 TABLET BY MOUTH NIGHTLY AT BEDTIME AS NEEDED 30 tablet 1  . valACYclovir (VALTREX) 500 MG tablet TAKE 1 TABLET BY MOUTH TWICE A DAY FOR 3 DAYS AS NEEDED FOR OUTBREAKS (Patient not  taking: Reported on 10/11/2018) 30 tablet 0   No current facility-administered medications for this visit.     Allergies as of 10/11/2018  . (No Known Allergies)    Vitals: BP 138/88 (BP Location: Right Arm, Patient Position: Sitting)   Pulse 67   Ht 6\' 1"  (1.854 m)   Wt 222 lb (100.7 kg)   BMI 29.29 kg/m   Weight 225 Last Weight:  Wt Readings from Last 1 Encounters:  10/11/18 222 lb (100.7 kg)   Last Height:   Ht Readings from Last 1 Encounters:  10/11/18 6\' 1"  (1.854 m)   Physical exam: Exam: Gen: NAD, conversant, well nourised, obese, well groomed                     CV: RRR, no MRG. No Carotid Bruits. No peripheral edema, warm, nontender Eyes: Conjunctivae clear without exudates or hemorrhage  Neuro: Detailed Neurologic Exam  Speech:    Speech is aphasic with paraphasic errors;  normal comprehension.  Cognition:    The patient is oriented to person, place, and time;     recent and remote memory intact;     language fluent;     normal attention, concentration,     fund of knowledge Cranial Nerves:    The pupils are equal, round, and reactive to light. The fundi are normal and spontaneous venous pulsations are present. Visual fields are full to finger confrontation. Extraocular movements are intact. Trigeminal sensation is intact and the muscles of mastication are normal. The face is symmetric. The palate elevates in the midline. Hearing intact. Voice is normal. Shoulder shrug is normal. The tongue has normal motion without fasciculations.   Coordination:    Normal finger to nose and heel to shin. Normal rapid alternating movements.   Gait:    Heel-toe and tandem gait are normal.   Motor Observation:    No asymmetry, no atrophy, and no involuntary movements noted. Tone:    Normal muscle tone.    Posture:    Posture is normal. normal erect    Strength:  Strength is V/V in the upper and lower limbs.      Sensation: intact to LT     Reflex  Exam:  DTR's:    Deep tendon reflexes in the upper and lower extremities are normal bilaterally.   Toes:    The toes are downgoing bilaterally.   Clonus:    Clonus is absent.       Assessment/Plan: This is a 48 year old male who was recently admitted for aseptic meningitis, LP showed elevated white blood cells predominantly lymphocytic, HSV PCR CSF was negative.  He has a continued headache likely postinfectious headache however given his concerning symptoms need further workup.  He is experiencing new symptoms of aphasia, alteration of awareness needs another MRI to eval for stroke or recurrent meningitis/infection or resultant increased intracranial HTN, hydrocephalus, cerebritis due to prior meningitis. Go to ED if symptoms worsen.   Repeat MRI of the brain w/wo contrast Lumbar puncture for opening pressure and csf studies Decrease Topiramate to 50mg  for 3-4 days then can stop or continue 50 mg Stop emgality Try Nortriptyline(or Amitriptyline) next as a headache preventative Try botox today - take 10 day to 2 weeks to "kick in", if improves migraines and headaches will proceed for approval will use samples today and provide migraine for botox procedure.   Fioricet, Topiramate, tramadol not helping, for acute management try: Maxalt (Rizatriptan): Please take one tablet at the onset of your headache. If it does not improve the symptoms please take one additional tablet. Do not take more then 2 tablets in 24hrs. Do not take use more then 2 to 3 times in a week.  Zofran (ondansetron) for nausea or dizziness/vestibular symptoms  Orders Placed This Encounter  Procedures  . MR BRAIN W WO CONTRAST  . DG FLUORO GUIDED LOC OF NEEDLE/CATH TIP FOR SPINAL INJECT LT   Meds ordered this encounter  Medications  . topiramate (TOPAMAX) 50 MG tablet    Sig: Take 1 tablet (50 mg total) by mouth at bedtime.    Dispense:  30 tablet    Refill:  11  . ketorolac (TORADOL) injection 60 mg   Orders  Placed This Encounter  Procedures  . MR BRAIN W WO CONTRAST  . DG FLUORO GUIDED LOC OF NEEDLE/CATH TIP FOR SPINAL INJECT LT   Discussed: To prevent or relieve headaches, try the following: Cool Compress. Lie down and place a cool compress on your head.  Avoid headache triggers. If certain foods or odors seem to have triggered your migraines in the past, avoid them. A headache diary might help you identify triggers.  Include physical activity in your daily routine. Try a daily walk or other moderate aerobic exercise.  Manage stress. Find healthy ways to cope with the stressors, such as delegating tasks on your to-do list.  Practice relaxation techniques. Try deep breathing, yoga, massage and visualization.  Eat regularly. Eating regularly scheduled meals and maintaining a healthy diet might help prevent headaches. Also, drink plenty of fluids.  Follow a regular sleep schedule. Sleep deprivation might contribute to headaches Consider biofeedback. With this mind-body technique, you learn to control certain bodily functions - such as muscle tension, heart rate and blood pressure - to prevent headaches or reduce headache pain.    Proceed to emergency room if you experience new or worsening symptoms or symptoms do not resolve, if you have new neurologic symptoms or if headache is severe, or for any concerning symptom.    Sarina Ill, MD  [facility] [phone]  [place], [place] [address]   Consent Form Botulism Toxin Injection For Chronic Migraine    Reviewed orally with patient, additionally signature is on file:  Botulism toxin has been approved by the Federal drug administration for treatment of chronic migraine. Botulism toxin does not cure chronic migraine and it may not be effective in some patients.  The administration of botulism toxin is accomplished by injecting a small amount of toxin into the muscles of the neck and head. Dosage must be  titrated for each individual. Any benefits resulting from botulism toxin tend to wear off after 3 months with a repeat injection required if benefit is to be maintained. Injections are usually done every 3-4 months with maximum effect peak achieved by about 2 or 3 weeks. Botulism toxin is expensive and you should be sure of what costs you will incur resulting from the injection.  The side effects of botulism toxin use for chronic migraine may include:   -Transient, and usually mild, facial weakness with facial injections  -Transient, and usually mild, head or neck weakness with head/neck injections  -Reduction or loss of forehead facial animation due to forehead muscle weakness  -Eyelid drooping  -Dry eye  -Pain at the site of injection or bruising at the site of injection  -Double vision  -Potential unknown long term risks  Contraindications: You should not have Botox if you are pregnant, nursing, allergic to albumin, have an infection, skin condition, or muscle weakness at the site of the injection, or have myasthenia gravis, Lambert-Eaton syndrome, or ALS.  It is also possible that as with any injection, there may be an allergic reaction or no effect from the medication. Reduced effectiveness after repeated injections is sometimes seen and rarely infection at the injection site may occur. All care will be taken to prevent these side effects. If therapy is given over a long time, atrophy and wasting in the muscle injected may occur. Occasionally the patient's become refractory to treatment because they develop antibodies to the toxin. In this event, therapy needs to be modified.  I have read the above information and consent to the administration of botulism toxin.    BOTOX PROCEDURE NOTE FOR MIGRAINE HEADACHE    Contraindications and precautions discussed with patient(above). Aseptic procedure was observed and patient tolerated procedure. Procedure performed by Dr. Georgia Dom  The  condition has existed for more than 6 months, and pt does not have a diagnosis of ALS, Myasthenia Gravis or Lambert-Eaton Syndrome.  Risks and benefits of injections discussed and pt agrees to proceed with the procedure.  Written consent obtained  These injections are medically necessary. Pt  receives good benefits from these injections. These injections do not cause sedations or hallucinations which the oral therapies may cause.  Description of procedure:  The patient was placed in a sitting position. The standard protocol was used for Botox as follows, with 5 units of Botox injected at each site:   -Procerus muscle, midline injection  -Corrugator muscle, bilateral injection  -Frontalis muscle, bilateral injection, with 2 sites each side, medial injection was performed in the upper one third of the frontalis muscle, in the region vertical from the medial inferior edge of the superior orbital rim. The lateral injection was again in the upper one third of the forehead vertically above the lateral limbus of the cornea, 1.5 cm lateral to the medial injection site.  - Levator Scapulae: 5 units bilaterally  -Temporalis muscle injection, 5 sites, bilaterally. The first injection  was 3 cm above the tragus of the ear, second injection site was 1.5 cm to 3 cm up from the first injection site in line with the tragus of the ear. The third injection site was 1.5-3 cm forward between the first 2 injection sites. The fourth injection site was 1.5 cm posterior to the second injection site. 5th site laterally in the temporalis  muscleat the level of the outer canthus.  - Patient feels her clenching is a trigger for headaches. +5 units masseter bilaterally   - Patient feels the migraines are centered around the eyes +5 units bilaterally at the outer canthus in the orbicularis occuli  -Occipitalis muscle injection, 3 sites, bilaterally. The first injection was done one half way between the occipital  protuberance and the tip of the mastoid process behind the ear. The second injection site was done lateral and superior to the first, 1 fingerbreadth from the first injection. The third injection site was 1 fingerbreadth superiorly and medially from the first injection site.  -Cervical paraspinal muscle injection, 2 sites, bilateral knee first injection site was 1 cm from the midline of the cervical spine, 3 cm inferior to the lower border of the occipital protuberance. The second injection site was 1.5 cm superiorly and laterally to the first injection site.  -Trapezius muscle injection was performed at 3 sites, bilaterally. The first injection site was in the upper trapezius muscle halfway between the inflection point of the neck, and the acromion. The second injection site was one half way between the acromion and the first injection site. The third injection was done between the first injection site and the inflection point of the neck.   Will return for repeat injection in 3 months.   A 200 unit sof Botox was used, any Botox not injected was wasted. The patient tolerated the procedure well, there were no complications of the above procedure.   Phone [phone] Fax [phone]  A total of 60 minutes was spent face-to-face with this patient. Over half this time was spent on counseling patient on the  1. Headache due to viral infection   2. Other vascular headache   3. Aphasia   4. Alteration of awareness   5. Meningitis   6. Intracranial hypertension   7. Intractable chronic migraine without aura and without status migrainosus     diagnosis and different diagnostic and therapeutic options, counseling and coordination of care, risks ans benefits of management, compliance, or risk factor reduction and education.

## 2018-10-11 NOTE — Patient Instructions (Addendum)
Repeat MRI of the brain w/wo contrast Lumbar puncture for opening pressure and csf studies Decrease Topiramate to 50mg  for 3-4 days then can stop or continue 50 mg Stop emgality Try Nortriptyline(or Amitriptyline) next as a headache preventative Try botox today - take 10 day to 2 weeks to "kick in"

## 2018-10-14 ENCOUNTER — Other Ambulatory Visit: Payer: Self-pay | Admitting: Neurology

## 2018-10-14 ENCOUNTER — Encounter: Payer: Self-pay | Admitting: Neurology

## 2018-10-14 ENCOUNTER — Telehealth: Payer: Self-pay | Admitting: Neurology

## 2018-10-14 DIAGNOSIS — G43719 Chronic migraine without aura, intractable, without status migrainosus: Secondary | ICD-10-CM | POA: Insufficient documentation

## 2018-10-14 MED ORDER — NORTRIPTYLINE HCL 10 MG PO CAPS: 10.0000 mg | ORAL_CAPSULE | Freq: Every day | ORAL | 3 refills | Status: DC

## 2018-10-14 NOTE — Telephone Encounter (Signed)
Larry Cruz, can patient come this week to [name] for MRI of the brain? Since [name] is not here, can you call [name] and ask them to set up his Lumbar Puncture   [name], can we start the botox process and see if he can be approved? I used samples at last appointment so just let me know if his insurance approves and I can discuss with him.   Thanks, let me know.

## 2018-10-15 MED FILL — NORTRIPTYLINE HCL 10 MG CAP: 10 | 30 days supply | Qty: 60 | Fill #0

## 2018-10-15 NOTE — Telephone Encounter (Signed)
MR Brain w/wo contrast Dr. Lianne Bushy [facility] Auth: [place] Ref # [id number]. He is scheduled at [facility] for 10/16/18 I also informed him that I faxed the LP order to [place] at [facility] and she should be contacting him to get it scheduled.

## 2018-10-16 ENCOUNTER — Ambulatory Visit: Payer: 59

## 2018-10-16 DIAGNOSIS — G932 Benign intracranial hypertension: Secondary | ICD-10-CM

## 2018-10-16 DIAGNOSIS — R4701 Aphasia: Secondary | ICD-10-CM | POA: Diagnosis not present

## 2018-10-16 DIAGNOSIS — G441 Vascular headache, not elsewhere classified: Secondary | ICD-10-CM | POA: Diagnosis not present

## 2018-10-16 DIAGNOSIS — R419 Unspecified symptoms and signs involving cognitive functions and awareness: Secondary | ICD-10-CM

## 2018-10-16 DIAGNOSIS — G039 Meningitis, unspecified: Secondary | ICD-10-CM | POA: Diagnosis not present

## 2018-10-16 MED ORDER — GADOBENATE DIMEGLUMINE 529 MG/ML IV SOLN
20.0000 mL | Freq: Once | INTRAVENOUS | Status: AC | PRN
  Administered 2018-10-16: 20 mL via INTRAVENOUS

## 2018-10-16 NOTE — Telephone Encounter (Signed)
I called the patient and spoke with him about scheduling botox. He did not have his schedule in front of him and requested to call back at a later time.

## 2018-10-22 ENCOUNTER — Ambulatory Visit
Admission: RE | Admit: 2018-10-22 | Discharge: 2018-10-22 | Disposition: A | Discharge status: Home / Self Care | Payer: 59 | Source: Ambulatory Visit | Attending: Neurology | Admitting: Neurology

## 2018-10-22 VITALS — BP 134/85 | HR 58

## 2018-10-22 DIAGNOSIS — R51 Headache: Secondary | ICD-10-CM

## 2018-10-22 DIAGNOSIS — R519 Headache, unspecified: Secondary | ICD-10-CM

## 2018-10-22 DIAGNOSIS — G441 Vascular headache, not elsewhere classified: Secondary | ICD-10-CM

## 2018-10-22 DIAGNOSIS — G932 Benign intracranial hypertension: Secondary | ICD-10-CM

## 2018-10-22 DIAGNOSIS — G039 Meningitis, unspecified: Secondary | ICD-10-CM | POA: Diagnosis not present

## 2018-10-22 DIAGNOSIS — R4701 Aphasia: Secondary | ICD-10-CM

## 2018-10-22 DIAGNOSIS — R419 Unspecified symptoms and signs involving cognitive functions and awareness: Secondary | ICD-10-CM | POA: Diagnosis not present

## 2018-10-22 DIAGNOSIS — B349 Viral infection, unspecified: Secondary | ICD-10-CM | POA: Diagnosis not present

## 2018-10-22 NOTE — Discharge Instructions (Signed)

## 2018-10-24 ENCOUNTER — Ambulatory Visit: Payer: 59 | Admitting: Family Medicine

## 2018-10-24 ENCOUNTER — Encounter: Payer: Self-pay | Admitting: Family Medicine

## 2018-10-24 VITALS — BP 138/80 | HR 80 | Temp 100.0°F | Ht 73.0 in | Wt 225.0 lb

## 2018-10-24 DIAGNOSIS — R05 Cough: Secondary | ICD-10-CM

## 2018-10-24 DIAGNOSIS — M62830 Muscle spasm of back: Secondary | ICD-10-CM | POA: Diagnosis not present

## 2018-10-24 DIAGNOSIS — J101 Influenza due to other identified influenza virus with other respiratory manifestations: Secondary | ICD-10-CM

## 2018-10-24 DIAGNOSIS — R509 Fever, unspecified: Secondary | ICD-10-CM

## 2018-10-24 DIAGNOSIS — R059 Cough, unspecified: Secondary | ICD-10-CM

## 2018-10-24 DIAGNOSIS — R0781 Pleurodynia: Secondary | ICD-10-CM | POA: Diagnosis not present

## 2018-10-24 DIAGNOSIS — R519 Headache, unspecified: Secondary | ICD-10-CM

## 2018-10-24 DIAGNOSIS — R51 Headache: Secondary | ICD-10-CM | POA: Diagnosis not present

## 2018-10-24 LAB — POCT INFLUENZA A/B
Influenza A, POC: POSITIVE — AB
Influenza B, POC: NEGATIVE

## 2018-10-24 MED ORDER — OSELTAMIVIR PHOSPHATE 75 MG PO CAPS: 75.0000 mg | ORAL_CAPSULE | Freq: Two times a day (BID) | ORAL | 0 refills | Status: DC

## 2018-10-24 MED ORDER — KETOROLAC TROMETHAMINE 60 MG/2ML IM SOLN
60.0000 mg | Freq: Once | INTRAMUSCULAR | Status: AC
  Administered 2018-10-24: 60 mg via INTRAMUSCULAR

## 2018-10-24 MED ORDER — CYCLOBENZAPRINE HCL 10 MG PO TABS: 5.0000 mg | ORAL_TABLET | Freq: Three times a day (TID) | ORAL | 0 refills | Status: DC | PRN

## 2018-10-24 MED FILL — CYCLOBENZAPRINE 10 MG TAB: 10 | 10 days supply | Qty: 30 | Fill #0

## 2018-10-24 MED FILL — OSELTAMIVIR PHOS 75 MG CAP: 75 | 5 days supply | Qty: 10 | Fill #0

## 2018-10-24 NOTE — Patient Instructions (Addendum)
Drink plenty of water. Use Mucinex DM (or robitussin DM) as needed for cough. You can use decongestants if needed for runny nose, sinus pain, postnasal drainage. Continue to use tylenol as needed for pain and fever. You should probably use higher doses of ibuprofen, if tolerated--800mg  every 8 hours, taken with food. OR you can use 2 Aleve twice daily with food instead of ibuprofen. Take the tamiflu twice daily with food for 5 days. You can try benzonatate if needed for cough (you mentioned having at home)--if helpful and you need more, let us know and we can send in a refill.  You were given 60mg  of toradol (around 2:15); wait at least 6 hours before taking any ibuprofen (you may take tylenol sooner, if needed). Continue to use ice and/or heat for your pain, and massage over the muscles in your back.  Take the flexeril at bedtime.  If too drowsy, hold off on taking the nortriptyline. You may use it up to every 8 hours during the day, if it doesn't make you too drowsy. Use with caution during the day (consider using just half to start, if taken during the day, and not plan to drive anywhere until you know if it makes you drowsy).     Influenza, Adult Influenza is also called "the flu." It is an infection in the lungs, nose, and throat (respiratory tract). It is caused by a virus. The flu causes symptoms that are similar to symptoms of a cold. It also causes a high fever and body aches. The flu spreads easily from person to person (is contagious). Getting a flu shot (influenza vaccination) every year is the best way to prevent the flu. What are the causes? This condition is caused by the influenza virus. You can get the virus by:  Breathing in droplets that are in the air from the cough or sneeze of a person who has the virus.  Touching something that has the virus on it (is contaminated) and then touching your mouth, nose, or eyes. What increases the risk? Certain things may make you  more likely to get the flu. These include:  Not washing your hands often.  Having close contact with many people during cold and flu season.  Touching your mouth, eyes, or nose without first washing your hands.  Not getting a flu shot every year. You may have a higher risk for the flu, along with serious problems such as a lung infection (pneumonia), if you:  Are older than 65.  Are pregnant.  Have a weakened disease-fighting system (immune system) because of a disease or taking certain medicines.  Have a long-term (chronic) illness, such as: ? Heart, kidney, or lung disease. ? Diabetes. ? Asthma.  Have a liver disorder.  Are very overweight (morbidly obese).  Have anemia. This is a condition that affects your red blood cells. What are the signs or symptoms? Symptoms usually begin suddenly and last 4-14 days. They may include:  Fever and chills.  Headaches, body aches, or muscle aches.  Sore throat.  Cough.  Runny or stuffy (congested) nose.  Chest discomfort.  Not wanting to eat as much as normal (poor appetite).  Weakness or feeling tired (fatigue).  Dizziness.  Feeling sick to your stomach (nauseous) or throwing up (vomiting). How is this treated? If the flu is found early, you can be treated with medicine that can help reduce how bad the illness is and how long it lasts (antiviral medicine). This may be given by mouth (  orally) or through an IV tube. Taking care of yourself at home can help your symptoms get better. Your doctor may suggest:  Taking over-the-counter medicines.  Drinking plenty of fluids. The flu often goes away on its own. If you have very bad symptoms or other problems, you may be treated in a hospital. Follow these instructions at home:     Activity  Rest as needed. Get plenty of sleep.  Stay home from work or school as told by your doctor. ? Do not leave home until you do not have a fever for 24 hours without taking  medicine. ? Leave home only to visit your doctor. Eating and drinking  Take an ORS (oral rehydration solution). This is a drink that is sold at pharmacies and stores.  Drink enough fluid to keep your pee (urine) pale yellow.  Drink clear fluids in small amounts as you are able. Clear fluids include: ? Water. ? Ice chips. ? Fruit juice that has water added (diluted fruit juice). ? Low-calorie sports drinks.  Eat bland, easy-to-digest foods in small amounts as you are able. These foods include: ? Bananas. ? Applesauce. ? Rice. ? Lean meats. ? Toast. ? Crackers.  Do not eat or drink: ? Fluids that have a lot of sugar or caffeine. ? Alcohol. ? Spicy or fatty foods. General instructions  Take over-the-counter and prescription medicines only as told by your doctor.  Use a cool mist humidifier to add moisture to the air in your home. This can make it easier for you to breathe.  Cover your mouth and nose when you cough or sneeze.  Wash your hands with soap and water often, especially after you cough or sneeze. If you cannot use soap and water, use alcohol-based hand sanitizer.  Keep all follow-up visits as told by your doctor. This is important. How is this prevented?   Get a flu shot every year. You may get the flu shot in late summer, fall, or winter. Ask your doctor when you should get your flu shot.  Avoid contact with people who are sick during fall and winter (cold and flu season). Contact a doctor if:  You get new symptoms.  You have: ? Chest pain. ? Watery poop (diarrhea). ? A fever.  Your cough gets worse.  You start to have more mucus.  You feel sick to your stomach.  You throw up. Get help right away if you:  Have shortness of breath.  Have trouble breathing.  Have skin or nails that turn a bluish color.  Have very bad pain or stiffness in your neck.  Get a sudden headache.  Get sudden pain in your face or ear.  Cannot eat or drink without  throwing up. Summary  Influenza ("the flu") is an infection in the lungs, nose, and throat. It is caused by a virus.  Take over-the-counter and prescription medicines only as told by your doctor.  Getting a flu shot every year is the best way to avoid getting the flu. This information is not intended to replace advice given to you by your health care provider. Make sure you discuss any questions you have with your health care provider. Document Released: 06/14/2008 Document Revised: 02/21/2018 Document Reviewed: 02/21/2018 Elsevier Interactive Patient Education  2019 [employer].

## 2018-10-24 NOTE — Telephone Encounter (Signed)
Called patient and schedule him for 1:30pm per Dr. Tomi Bamberger.

## 2018-10-24 NOTE — Progress Notes (Signed)
Chief Complaint  Patient presents with  . Fall    Jan 24th fell inside house on steps about 8 steps. Hurts rib/spine area. Saw chiro. Monday had spinal tap and was flat for 24hrs and now his back is really hurting. (spasm). Coughing is making it worse. Cough started yesterday, unsure if he has had fever or body aches.   Fell 1/24--fell while going down wood stairs in socks, holding laundry basket.  Fell onto his right ribs. Felt bruised, muscles were sore.  Spine felt very tight.  Went to [employer], which helped his spine feel better.  Pain shifted back to his ribs.  Under the care of his neurologist, he tapered off topamax (trouble focusing, no significant effect on HA's), did botox injections to face, head and neck, and started nortriptyline.  He is still taking 10mg  (didn't increase to 20mg ).  Has felt the best he has since August as far as headaches, and lack of side effects. Neuro recommended MRI and lumbar puncture. He had the LP Monday, laid flat  24 hours, but that triggered flare of back pain.  Yesterday he started coughing, and rib pain is terrible today. Using OTC ibuprofen or tylenol as needed for pain, nothing regularly. Last med taken about 5 hours ago. Heat/ice has been helpful  Today he is complaining of some runny nose, cough.  No ear pain, just slight throat discomfort from drainage.  Has had subjectively felt warm, slight chill. +sick contacts--son at home today with fever, cough, wife also feels bad.   PMH, PSH, SH reviewed  Outpatient Encounter Medications as of 10/24/2018  Medication Sig  . atorvastatin (LIPITOR) 40 MG tablet Take 1 tablet (40 mg total) by mouth daily.  . nortriptyline (PAMELOR) 10 MG capsule Take 1 capsule (10 mg total) by mouth at bedtime. Can increase to 2omg (2 capsules) in 1 week as needed and tolerated.  . zolpidem (AMBIEN) 10 MG tablet TAKE 1 TABLET BY MOUTH NIGHTLY AT BEDTIME AS NEEDED  . [DISCONTINUED] topiramate (TOPAMAX) 50 MG tablet Take 1  tablet (50 mg total) by mouth at bedtime.  . cyclobenzaprine (FLEXERIL) 10 MG tablet Take 0.5-1 tablets (5-10 mg total) by mouth 3 (three) times daily as needed for muscle spasms.  Marland Kitchen oseltamivir (TAMIFLU) 75 MG capsule Take 1 capsule (75 mg total) by mouth 2 (two) times daily.  . valACYclovir (VALTREX) 500 MG tablet TAKE 1 TABLET BY MOUTH TWICE A DAY FOR 3 DAYS AS NEEDED FOR OUTBREAKS (Patient not taking: Reported on 10/11/2018)  . [EXPIRED] ketorolac (TORADOL) injection 60 mg    No facility-administered encounter medications on file as of 10/24/2018.    (not taking tamiflu or flexeril prior to today's visit)  No Known Allergies  ROS: no dizziness, syncope, DOE, chest pain, nausea or vomiting. No bleeding, bruising, rash.  +URI symptoms per HPI, subjective fever/chill, +thoracic back pain and right lower rib pain.   PHYSICAL EXAM:  BP 138/80   Pulse 80   Temp 100 F (37.8 C) (Tympanic)   Ht 6\' 1"  (1.854 m)   Wt 225 lb (102.1 kg)   BMI 29.69 kg/m   Pleasant male, in moderate chest wall/rib discomfort with any coughing. HEENT: PERRL, EOMI, conjunctiva and sclera are clear.  Nasal mucosa is mildly edematous on the left. TM's and EACs normal sinuses are nontender. OP is clear Neck: no lymphadenopathy or mass Back: Tender at lower thoracic spine and along the right paraspinous muscles in the lower thoracic area. No CVA tenderness or other  spinal tenderness (cervical or lumbar). Also tender at the inferior ribs on the right, extending laterally across the floating ribs. No bony stepoffs. Heart: regular rate and rhythm (rate of 90 on exam), no murmur Lungs: clear bilaterally Extremities: no edema Skin: normal turgor, no rash Psych: normal mood, affect, hygiene and grooming  Influenza A +    ASSESSMENT/PLAN:  Influenza A - treat with tamiflu and supportive measures - Plan: oseltamivir (TAMIFLU) 75 MG capsule  Fever, unspecified fever cause - Plan: Influenza A/B  Cough -  exacerbating his rib pain. Mucinex DM, and benzonatate prn. - Plan: Influenza A/B  Muscle spasm of back - heat, massage. risks/benefits of muscle relaxants reviewed - Plan: cyclobenzaprine (FLEXERIL) 10 MG tablet, ketorolac (TORADOL) injection 60 mg  Rib pain on right side - due to fall 1/24, cannot r/o fracture, but see no need to x-ray; supportive measures  Frequent headaches - improved with botox injections (and pamelor)    Drink plenty of water. Use Mucinex DM (or robitussin DM) as needed for cough. You can use decongestants if needed for runny nose, sinus pain, postnasal drainage. Continue to use tylenol as needed for pain and fever. You should probably use higher doses of ibuprofen, if tolerated--800mg  every 8 hours, taken with food. OR you can use 2 Aleve twice daily with food instead of ibuprofen. Take the tamiflu twice daily with food for 5 days. You can try benzonatate if needed for cough (you mentioned having at home)--if helpful and you need more, let us know and we can send in a refill.  You were given 60mg  of toradol (around 2:15); wait at least 6 hours before taking any ibuprofen (you may take tylenol sooner, if needed). Continue to use ice and/or heat for your pain, and massage over the muscles in your back.  Take the flexeril at bedtime.  If too drowsy, hold off on taking the nortriptyline. You may use it up to every 8 hours during the day, if it doesn't make you too drowsy. Use with caution during the day (consider using just half to start, if taken during the day, and not plan to drive anywhere until you know if it makes you drowsy).

## 2018-10-29 DIAGNOSIS — M25521 Pain in right elbow: Secondary | ICD-10-CM | POA: Diagnosis not present

## 2018-10-29 DIAGNOSIS — M7711 Lateral epicondylitis, right elbow: Secondary | ICD-10-CM | POA: Diagnosis not present

## 2018-11-05 ENCOUNTER — Telehealth: Payer: Self-pay | Admitting: Neurology

## 2018-11-05 NOTE — Telephone Encounter (Signed)
Regina @[facility] has called to provide lab results. She has stated when calling back ref# [id number], she states this is not urgent, results have been faxed also

## 2018-11-06 NOTE — Telephone Encounter (Signed)
I called [facility] back and spoke with Lelan Pons. She stated pt's CSF showed no growth and gram stain showed no WBC. Results will also be faxed to [phone].

## 2018-11-14 ENCOUNTER — Other Ambulatory Visit: Payer: Self-pay

## 2018-11-20 LAB — FUNGUS CULTURE W SMEAR
MICRO NUMBER:: 141413
SMEAR:: NONE SEEN
SPECIMEN QUALITY:: ADEQUATE

## 2018-11-20 LAB — CSF CULTURE W GRAM STAIN
MICRO NUMBER:: 141414
Result:: NO GROWTH
SPECIMEN QUALITY:: ADEQUATE

## 2018-11-20 LAB — CSF CELL COUNT WITH DIFFERENTIAL
RBC Count, CSF: 51 cells/uL — ABNORMAL HIGH (ref 0–10)
WBC, CSF: 3 cells/uL (ref 0–5)

## 2018-11-20 LAB — GLUCOSE, CSF: Glucose, CSF: 59 mg/dL (ref 40–80)

## 2018-11-20 LAB — PROTEIN, CSF: Total Protein, CSF: 51 mg/dL — ABNORMAL HIGH (ref 15–45)

## 2018-11-21 ENCOUNTER — Other Ambulatory Visit: Payer: 59

## 2018-11-21 DIAGNOSIS — E7800 Pure hypercholesterolemia, unspecified: Secondary | ICD-10-CM

## 2018-11-21 DIAGNOSIS — Z5181 Encounter for therapeutic drug level monitoring: Secondary | ICD-10-CM | POA: Diagnosis not present

## 2018-11-22 ENCOUNTER — Encounter: Payer: Self-pay | Admitting: Family Medicine

## 2018-11-22 LAB — LIPID PANEL
CHOLESTEROL TOTAL: 216 mg/dL — AB (ref 100–199)
Chol/HDL Ratio: 4.3 ratio (ref 0.0–5.0)
HDL: 50 mg/dL (ref >39)
LDL Calculated: 135 mg/dL — ABNORMAL HIGH (ref 0–99)
Triglycerides: 153 mg/dL — ABNORMAL HIGH (ref 0–149)
VLDL Cholesterol Cal: 31 mg/dL (ref 5–40)

## 2018-11-22 LAB — HEPATIC FUNCTION PANEL
ALT: 41 IU/L (ref 0–44)
AST: 41 IU/L — ABNORMAL HIGH (ref 0–40)
Albumin: 4.6 g/dL (ref 4.0–5.0)
Alkaline Phosphatase: 93 IU/L (ref 39–117)
Bilirubin Total: 0.8 mg/dL (ref 0.0–1.2)
Bilirubin, Direct: 0.16 mg/dL (ref 0.00–0.40)
Total Protein: 7.2 g/dL (ref 6.0–8.5)

## 2018-12-31 ENCOUNTER — Other Ambulatory Visit: Payer: Self-pay | Admitting: Family Medicine

## 2018-12-31 DIAGNOSIS — E7800 Pure hypercholesterolemia, unspecified: Secondary | ICD-10-CM

## 2018-12-31 MED FILL — ATORVASTATIN 40 MG TABLET: 40 | 90 days supply | Qty: 90 | Fill #0

## 2019-01-02 ENCOUNTER — Telehealth: Payer: Self-pay | Admitting: Neurology

## 2019-01-07 NOTE — Telephone Encounter (Signed)
Spoke with Dr. Jaynee Eagles. Ok to bring pt in for botox Wed. Will use samples.

## 2019-01-11 ENCOUNTER — Ambulatory Visit: Payer: 59 | Admitting: Neurology

## 2019-01-23 ENCOUNTER — Ambulatory Visit (INDEPENDENT_AMBULATORY_CARE_PROVIDER_SITE_OTHER): Payer: 59 | Admitting: Neurology

## 2019-01-23 ENCOUNTER — Telehealth: Payer: Self-pay | Admitting: *Deleted

## 2019-01-23 ENCOUNTER — Other Ambulatory Visit: Payer: Self-pay

## 2019-01-23 VITALS — Temp 97.2°F

## 2019-01-23 DIAGNOSIS — G43709 Chronic migraine without aura, not intractable, without status migrainosus: Secondary | ICD-10-CM

## 2019-01-23 NOTE — Telephone Encounter (Signed)
Order form for Botox A 200 units with Dx Code: S31.594 completed & ready for MD signature.

## 2019-01-23 NOTE — Telephone Encounter (Signed)
Noted, thank you. DW  °

## 2019-01-23 NOTE — Progress Notes (Addendum)
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

## 2019-01-23 NOTE — Telephone Encounter (Signed)
-----   Message from Melvenia Beam, MD sent at 01/23/2019  8:54 AM EDT ----- Regarding: initiate botox Can we initiate botox approval for patient please? g43.709. thanks

## 2019-01-23 NOTE — Telephone Encounter (Signed)
Order form signed and taken to [name]'s desk.

## 2019-01-23 NOTE — Progress Notes (Signed)
[id number]    Provider:  Dr Jaynee Eagles Referring Provider: Rita Ohara, MD Primary Care Physician:  Rita Ohara, MD  CC:  Migraines  Interval history 01/23/2019: Patient's migraines continue. He has unilateral head pain which is pulsating/pounding and throbbing with nausea and light/sound sensitivity. Lasting 24 hours > 15 days a month for >6 months. Initiating botox has decreased his migraine days to <2 a month which is incredible. He feels great. Migraines started returning when botox was due. No aura. No medication overuse.   Interval history 06/27/2018: he is feeling more fatigued but still active. His vision is blurry, changing vision, physical exertion makes it worse. Headache worse with bending over, he wakes with headaches.+photo/phonophobia, sensory overload. In the past migraines were unilateral, pulsating different than these headaches more auras. Now he has more pressure, no hearing changes, He gets light headed, vestibular difficulty and vision changes. Motion makes it worse. Worsened last Tuesday and Wednesday. More frequent and longer. Discussed options, MRI of the brain, starting a preventative and choices, acute management. No fevers. His neck does hurt.   HPI:  Larry Cruz is a 48 y.o. male here as requested by Dr. Tomi Bamberger for headache. PMHx Migraines, viral meningitis. He had a severe headache, ended up with septic meningitis.  Headache started about a week prior to admission, went to the emergency room. Was treated in the hospital admitted august 3rd at its worst, nothing made it better or worse, but was the worst headache he is ever had it was severe and continuous.  Headache has significantly improved since being discharged however he has a residual headache.  He has a constant steady headache 1-2 continuous but often it gets 4-5 or 6.  The headache is in the frontal area and feels like pressure.  Prior to the headache, he is unsure if there was any inciting event he  did have elbow injury and then Cortizone injection at elbow, then 6-day oral dose of steroids for elbow, at the end of the oral taper is when the headache started, more pressure, frontotemporal, no blurry vision, no neck pain.  He was around sick children he might have picked up a virus he is unsure.  Nothing like this is ever happened to him before. No other focal neurologic deficits, associated symptoms, inciting events or modifiable factors.  Reviewed notes, labs and imaging from outside physicians, which showed: Reviewed notes from the emergency room, his admission his discharge.  Patient had been complaining of a headache for 1 week prior to going to the emergency room, he and his wife had a viral illness around the same time.  He had also been in contact with many people about 90 people the Saturday prior.  He had been on a steroid taper and had an elbow injection of cortisone as well.  He denied tick bites, mosquito bites but does work outdoors, he noted slight neck stiffness but denied nausea vomiting or significant fever.  He had completed 1 week course of doxycycline which was stopped due to his RMSF titer negative.   Reviewed images of CT of the head and CT of the head was unremarkable, reviewed labs and LP tube 1 showed 12 white blood cells, with 94% lymphs, protein 59, glucose 59, crypto antigen negative, Gram stain was negative, no organisms seen, he was admitted for viral meningitis.  HSV and VDRL during hospitalization came back negative.  He was given antivirals while in the hospital and provided supportive care, antivirals were discontinued when  HSV came back negative.  He was discharged in improved condition.    Ct showed No acute intracranial abnormalities including mass lesion or mass effect, hydrocephalus, extra-axial fluid collection, midline shift, hemorrhage, or acute infarction, large ischemic events (personally reviewed images)    Review of Systems: Patient complains of  symptoms per HPI as well as the following symptoms: headache. Pertinent negatives and positives per HPI. All others negative.   Social History   Socioeconomic History   Marital status: Married    Spouse name: Not on file   Number of children: 2   Years of education: Not on file   Highest education level: Master's degree (e.g., MA, MS, MEng, MEd, MSW, MBA)  Occupational History   Occupation: Development worker, [employer]    Employer: [employer] resource strain: Not on file   Food insecurity:    Worry: Not on file    Inability: Not on file   Transportation needs:    Medical: Not on file    Non-medical: Not on file  Tobacco Use   Smoking status: Never Smoker   Smokeless tobacco: Never Used  Substance and Sexual Activity   Alcohol use: Not Currently    Comment: 1 drink 3-4 times per week occasional   Drug use: No   Sexual activity: Yes    Partners: Female    Birth control/protection: Surgical    Comment: vasectomy  Lifestyle   Physical activity:    Days per week: Not on file    Minutes per session: Not on file   Stress: Not on file  Relationships   Social connections:    Talks on phone: Not on file    Gets together: Not on file    Attends religious service: Not on file    Active member of club or organization: Not on file    Attends meetings of clubs or organizations: Not on file    Relationship status: Not on file   Intimate partner violence:    Fear of current or ex partner: Not on file    Emotionally abused: Not on file    Physically abused: Not on file    Forced sexual activity: Not on file  Other Topics Concern   Not on file  Social History Narrative   Lives with wife, daughter, son and dog   Right handed   Caffeine: not regular right now    Family History  Problem Relation Age of Onset   Heart disease Father        CABG in mid 97's   Hypertension Father    Alzheimer's disease Father     Hyperlipidemia Father    Diabetes Father    Hyperlipidemia Mother    Hyperlipidemia Sister    Hyperlipidemia Brother    Hypertension Brother    Hyperlipidemia Brother    Hyperlipidemia Sister    Melanoma Sister    Breast cancer Sister 46   Cancer Sister    Hyperlipidemia Sister    Breast cancer Sister 31   Cancer Sister    Hyperlipidemia Sister    Hyperlipidemia Sister    Hyperlipidemia Sister    Hyperlipidemia Sister    Asthma Son     Past Medical History:  Diagnosis Date   Aseptic meningitis    Colorblind    Dyslipidemia    Headache    Herpes genitalis in men    Low back pain    Meningitis 2019   negative cultures  Torn tendon    R elbow    Past Surgical History:  Procedure Laterality Date   CERVICAL DISC SURGERY  2009   JONES, MD; fusion   [place], MD; L4-L5 (right)   LUMBAR DISC SURGERY  11/2013   L4-5, Dr. Ronnald Ramp (left)   VASECTOMY  11/09    Current Outpatient Medications  Medication Sig Dispense Refill   atorvastatin (LIPITOR) 40 MG tablet TAKE 1 TABLET BY MOUTH DAILY. 90 tablet 0   cyclobenzaprine (FLEXERIL) 10 MG tablet Take 0.5-1 tablets (5-10 mg total) by mouth 3 (three) times daily as needed for muscle spasms. 30 tablet 0   nortriptyline (PAMELOR) 10 MG capsule Take 1 capsule (10 mg total) by mouth at bedtime. Can increase to 2omg (2 capsules) in 1 week as needed and tolerated. 60 capsule 3   oseltamivir (TAMIFLU) 75 MG capsule Take 1 capsule (75 mg total) by mouth 2 (two) times daily. 10 capsule 0   valACYclovir (VALTREX) 500 MG tablet TAKE 1 TABLET BY MOUTH TWICE A DAY FOR 3 DAYS AS NEEDED FOR OUTBREAKS (Patient not taking: Reported on 10/11/2018) 30 tablet 0   zolpidem (AMBIEN) 10 MG tablet TAKE 1 TABLET BY MOUTH NIGHTLY AT BEDTIME AS NEEDED 30 tablet 1   No current facility-administered medications for this visit.     Allergies as of 01/23/2019   (No Known Allergies)     Vitals: Temp (!) 97.2 F (36.2 C) (Oral)   Weight 225 Last Weight:  Wt Readings from Last 1 Encounters:  10/24/18 225 lb (102.1 kg)   Last Height:   Ht Readings from Last 1 Encounters:  10/24/18 6\' 1"  (1.854 m)   Physical exam: Exam: Gen: NAD, conversant, well nourised, obese, well groomed                     CV: RRR, no MRG. No Carotid Bruits. No peripheral edema, warm, nontender Eyes: Conjunctivae clear without exudates or hemorrhage  Neuro: Detailed Neurologic Exam  Speech:    Speech is normal; fluent and spontaneous with normal comprehension.  Cognition:    The patient is oriented to person, place, and time;     recent and remote memory intact;     language fluent;     normal attention, concentration,     fund of knowledge Cranial Nerves:    The pupils are equal, round, and reactive to light. The fundi are normal and spontaneous venous pulsations are present. Visual fields are full to finger confrontation. Extraocular movements are intact. Trigeminal sensation is intact and the muscles of mastication are normal. The face is symmetric. The palate elevates in the midline. Hearing intact. Voice is normal. Shoulder shrug is normal. The tongue has normal motion without fasciculations.   Coordination:    Normal finger to nose and heel to shin. Normal rapid alternating movements.   Gait:    Heel-toe and tandem gait are normal.   Motor Observation:    No asymmetry, no atrophy, and no involuntary movements noted. Tone:    Normal muscle tone.    Posture:    Posture is normal. normal erect    Strength:    Strength is V/V in the upper and lower limbs.      Sensation: intact to LT     Reflex Exam:  DTR's:    Deep tendon reflexes in the upper and lower extremities are normal bilaterally.   Toes:    The toes are downgoing  bilaterally.   Clonus:    Clonus is absent.       Assessment/Plan: This is a 48 year old male who chronic migraines.   - Botox, will  initiate approval - failed topamax and nortriptyline, flexeril, imitrex, tramadol, fioricet   Discussed: To prevent or relieve headaches, try the following: Cool Compress. Lie down and place a cool compress on your head.  Avoid headache triggers. If certain foods or odors seem to have triggered your migraines in the past, avoid them. A headache diary might help you identify triggers.  Include physical activity in your daily routine. Try a daily walk or other moderate aerobic exercise.  Manage stress. Find healthy ways to cope with the stressors, such as delegating tasks on your to-do list.  Practice relaxation techniques. Try deep breathing, yoga, massage and visualization.  Eat regularly. Eating regularly scheduled meals and maintaining a healthy diet might help prevent headaches. Also, drink plenty of fluids.  Follow a regular sleep schedule. Sleep deprivation might contribute to headaches Consider biofeedback. With this mind-body technique, you learn to control certain bodily functions -- such as muscle tension, heart rate and blood pressure -- to prevent headaches or reduce headache pain.    Proceed to emergency room if you experience new or worsening symptoms or symptoms do not resolve, if you have new neurologic symptoms or if headache is severe, or for any concerning symptom.    Sarina Ill, MD  [facility] [address] [place], [place] [address]  Phone [phone] Fax [phone]  A total of 40 minutes was spent face-to-face with this patient. Over half this time was spent on counseling patient on the  1. Chronic migraine without aura without status migrainosus, not intractable     diagnosis and different diagnostic and therapeutic options, counseling and coordination of care, risks ans benefits of management, compliance, or risk factor reduction and education.

## 2019-01-23 NOTE — Telephone Encounter (Signed)
I called to schedule the patient for his next injection but he stated that he did not have his work calendar with him and he would need it. He will call back to schedule at a later time. DW

## 2019-03-06 ENCOUNTER — Other Ambulatory Visit: Payer: Self-pay | Admitting: Family Medicine

## 2019-03-06 DIAGNOSIS — G4700 Insomnia, unspecified: Secondary | ICD-10-CM

## 2019-03-06 MED FILL — VALACYCLOVIR HCL 500 MG TAB: 500 | 15 days supply | Qty: 30 | Fill #0

## 2019-03-06 MED FILL — ZOLPIDEM TARTRATE 10 MG TAB: 10 | 30 days supply | Qty: 30 | Fill #0

## 2019-03-06 NOTE — Telephone Encounter (Signed)
I sent this earlier so I will deny if you fill other one.

## 2019-03-06 NOTE — Telephone Encounter (Signed)
Is this okay to refill? 

## 2019-03-25 ENCOUNTER — Other Ambulatory Visit: Payer: Self-pay | Admitting: Family Medicine

## 2019-03-25 DIAGNOSIS — E7800 Pure hypercholesterolemia, unspecified: Secondary | ICD-10-CM

## 2019-03-25 MED FILL — ATORVASTATIN 40 MG TABLET: 40 | 90 days supply | Qty: 90 | Fill #0

## 2019-04-27 MED FILL — ATORVASTATIN 40 MG TABLET: 40 | 90 days supply | Qty: 90 | Fill #0

## 2019-05-01 ENCOUNTER — Ambulatory Visit (INDEPENDENT_AMBULATORY_CARE_PROVIDER_SITE_OTHER): Payer: 59 | Admitting: Family Medicine

## 2019-05-01 ENCOUNTER — Encounter: Payer: Self-pay | Admitting: Family Medicine

## 2019-05-01 ENCOUNTER — Telehealth: Payer: Self-pay | Admitting: Family Medicine

## 2019-05-01 ENCOUNTER — Other Ambulatory Visit: Payer: Self-pay

## 2019-05-01 DIAGNOSIS — G43719 Chronic migraine without aura, intractable, without status migrainosus: Secondary | ICD-10-CM | POA: Diagnosis not present

## 2019-05-01 NOTE — Progress Notes (Signed)
Botox- 200 units x 4 vials Lot: [id number] Expiration: 11/2021 NDC: 9987-2158-72  Bacteriostatic 0.9% Sodium Chloride- 61mL total Lot: [id number] Expiration: 06/20/2019 NDC: 5927-6394-32  Dx: W03.794 B/B

## 2019-05-01 NOTE — Progress Notes (Signed)
Consent Form Botulism Toxin Injection For Chronic Migraine  Hx: he continues to note improvement of migraines with Botox therapy. He does note return of migraines about 10 weeks after procedure.   Reviewed orally with patient, additionally signature is on file:  Botulism toxin has been approved by the Federal drug administration for treatment of chronic migraine. Botulism toxin does not cure chronic migraine and it may not be effective in some patients.  The administration of botulism toxin is accomplished by injecting a small amount of toxin into the muscles of the neck and head. Dosage must be titrated for each individual. Any benefits resulting from botulism toxin tend to wear off after 3 months with a repeat injection required if benefit is to be maintained. Injections are usually done every 3-4 months with maximum effect peak achieved by about 2 or 3 weeks. Botulism toxin is expensive and you should be sure of what costs you will incur resulting from the injection.  The side effects of botulism toxin use for chronic migraine may include:   -Transient, and usually mild, facial weakness with facial injections  -Transient, and usually mild, head or neck weakness with head/neck injections  -Reduction or loss of forehead facial animation due to forehead muscle weakness  -Eyelid drooping  -Dry eye  -Pain at the site of injection or bruising at the site of injection  -Double vision  -Potential unknown long term risks   Contraindications: You should not have Botox if you are pregnant, nursing, allergic to albumin, have an infection, skin condition, or muscle weakness at the site of the injection, or have myasthenia gravis, Lambert-Eaton syndrome, or ALS.  It is also possible that as with any injection, there may be an allergic reaction or no effect from the medication. Reduced effectiveness after repeated injections is sometimes seen and rarely infection at the injection site may occur. All  care will be taken to prevent these side effects. If therapy is given over a long time, atrophy and wasting in the muscle injected may occur. Occasionally the patient's become refractory to treatment because they develop antibodies to the toxin. In this event, therapy needs to be modified.  I have read the above information and consent to the administration of botulism toxin.    BOTOX PROCEDURE NOTE FOR MIGRAINE HEADACHE  Contraindications and precautions discussed with patient(above). Aseptic procedure was observed and patient tolerated procedure. Procedure performed by Dr. Georgia Dom  The condition has existed for more than 6 months, and pt does not have a diagnosis of ALS, Myasthenia Gravis or Lambert-Eaton Syndrome.  Risks and benefits of injections discussed and pt agrees to proceed with the procedure.  Written consent obtained  These injections are medically necessary. Pt  receives good benefits from these injections. These injections do not cause sedations or hallucinations which the oral therapies may cause.   Description of procedure:  The patient was placed in a sitting position. The standard protocol was used for Botox as follows, with 5 units of Botox injected at each site:  -Procerus muscle, midline injection  -Corrugator muscle, bilateral injection  -Frontalis muscle, bilateral injection, with 2 sites each side, medial injection was performed in the upper one third of the frontalis muscle, in the region vertical from the medial inferior edge of the superior orbital rim. The lateral injection was again in the upper one third of the forehead vertically above the lateral limbus of the cornea, 1.5 cm lateral to the medial injection site.  -Temporalis muscle injection, 4  sites, bilaterally. The first injection was 3 cm above the tragus of the ear, second injection site was 1.5 cm to 3 cm up from the first injection site in line with the tragus of the ear. The third injection site  was 1.5-3 cm forward between the first 2 injection sites. The fourth injection site was 1.5 cm posterior to the second injection site. 5th site laterally in the temporalis  muscleat the level of the outer canthus.  -Occipitalis muscle injection, 3 sites, bilaterally. The first injection was done one half way between the occipital protuberance and the tip of the mastoid process behind the ear. The second injection site was done lateral and superior to the first, 1 fingerbreadth from the first injection. The third injection site was 1 fingerbreadth superiorly and medially from the first injection site.  -Cervical paraspinal muscle injection, 2 sites, bilateral knee first injection site was 1 cm from the midline of the cervical spine, 3 cm inferior to the lower border of the occipital protuberance. The second injection site was 1.5 cm superiorly and laterally to the first injection site.  -Trapezius muscle injection was performed at 3 sites, bilaterally. The first injection site was in the upper trapezius muscle halfway between the inflection point of the neck, and the acromion. The second injection site was one half way between the acromion and the first injection site. The third injection was done between the first injection site and the inflection point of the neck.   Will return for repeat injection in 3 months.   A 155 units of Botox was used, any Botox not injected was wasted. The patient tolerated the procedure well, there were no complications of the above procedure.

## 2019-05-01 NOTE — Telephone Encounter (Signed)
12 wk Botox inj °

## 2019-05-06 NOTE — Telephone Encounter (Signed)
I called and scheduled the patient for his injection. DW

## 2019-05-12 NOTE — Progress Notes (Signed)
Made any corrections needed, and agree with procedure  Reid Nawrot, MD [facility]  

## 2019-05-28 ENCOUNTER — Encounter: Payer: Self-pay | Admitting: Family Medicine

## 2019-05-28 NOTE — Progress Notes (Signed)
Chief Complaint  Patient presents with  . Annual Exam    fasting annual exam. No concerns. Still has slight HA. Will got flu shot at work.    Larry Cruz is a 48 y.o. male who presents for a complete physical.     Hyperlipidemia follow-up: Dose was increased from 20 to 41m in 08/2018, only minimal improvement noted. Patient is reportedly following a low-fat, low cholesterol diet. Compliant with medications and denies medication side effects. Eating more takeout since wife went back to work teaching. Eating red meat 2-3x/week, infrequent eggs. +cheese, his "downfall". He is fasting for repeat labs today. Lab Results  Component Value Date   CHOL 216 (H) 11/21/2018   HDL 50 11/21/2018   LDLCALC 135 (H) 11/21/2018   TRIG 153 (H) 11/21/2018   CHOLHDL 4.3 11/21/2018   Insomnia--intermittent.There may be weeks he may need Ambien 1-2times, occasionally up to 4, but some weeks without (but no longer going months without, as he used to). He has needed more in the last month.  Averages about 1-2/week. Last filled #30 on 03/06/2019.   Genital HXYI:AXKPVVZSMOflares. A little more frequent due to stress, but still just a few times/year.  Last refilled Valtrex #30 03/06/2019.  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 He had ongoing pain in the R elbow, and had MRI 10/2018: 1. Moderate common extensor tendinopathy with partial tearing of the extensor carpi radialis brevis and a mild sprain of the proximal lateral ulnar collateral ligament. He saw Dr. GAmedeo Plenty and elected for continued conservative treatment for lateral epicondylitis. He hasn't been able to play any tennis, has some discomfort with activities (projects at home), and will follow-up soon.  It also affects his sleep, has to sleep with arm straight.  He has had chronic headaches since  the meningitis. He had reported fogginess, trouble focusing and getting words out.  He had MRI repeated in 09/2018: MRI brain (with and without) demonstrating: - Few scattered left parietal subcortical foci of nonspecific gliosis. - No acute findings. - No change from MRI on 07/10/2018.  Topamax wasn't helpful (and caused a lot of side effects), so was tapered.  He was given Botox injections and started on Nortriptyline. He reports he didn't take nortriptyline very long, mostly due to fear of side effects (that he had from topamax), and because botox was helping. He has been getting Botox injections every 12 weeks, and has been doing well. "A ton better". Last shot wasn't quite as effective. Still has some headaches, "weird headaches", takes some tylenol or aleve, not daily.  Immunization History  Administered Date(s) Administered  . Influenza Split 07/03/2012, 07/03/2014  . Tdap 10/22/2012   gets flu shots yearly at work ([employer] Last colonoscopy: never  Last PSA: never  Dentist: twice yearly  [facility] Exercise: Walks some in the neighborhood.  Rebuilding a closet--lots of stairs, lifting.  Prior to tennis elbow issues, played tennis 2-3x/weeks.  Still coaching soccer.  Vitamin D-OH normal in 04/2015 (40)  Past Medical History:  Diagnosis Date  . Aseptic meningitis   . Colorblind   . Dyslipidemia   . Headache   . 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 Lateral epicondylitis of right elbow 03/2018   s/p injection by Dr. DMicheline Chapman MRI 10/2018, f/b eval by Dr. GAmedeo Plenty . Low back pain   . Meningitis 2019  negative cultures  . Torn tendon    R elbow    Past Surgical History:  Procedure Laterality Date  . CERVICAL DISC SURGERY  2009   JONES, MD; fusion  . LUMBAR DISC SURGERY  2000   CARTER, MD; L4-L5 (right)  . LUMBAR DISC SURGERY  11/2013   L4-5, Dr. Ronnald Ramp (left)  . VASECTOMY  11/09    Social History   Socioeconomic History  . Marital status:  Married    Spouse name: Not on file  . Number of children: 2  . Years of education: Not on file  . Highest education level: Master's degree (e.g., MA, MS, MEng, MEd, MSW, MBA)  Occupational History  . Occupation: Editor, commissioning: [employer]  . Financial resource strain: Not on file  . Food insecurity    Worry: Not on file    Inability: Not on file  . Transportation needs    Medical: Not on file    Non-medical: Not on file  Tobacco Use  . Smoking status: Never Smoker  . Smokeless tobacco: Never Used  Substance and Sexual Activity  . Alcohol use: Not Currently    Comment: 1 drink 2 times per week occasional  . Drug use: No  . Sexual activity: Yes    Partners: Female    Birth control/protection: Surgical    Comment: vasectomy  Lifestyle  . Physical activity    Days per week: Not on file    Minutes per session: Not on file  . Stress: Not on file  Relationships  . Social Herbalist on phone: Not on file    Gets together: Not on file    Attends religious service: Not on file    Active member of club or organization: Not on file    Attends meetings of clubs or organizations: Not on file    Relationship status: Not on file  . Intimate partner violence    Fear of current or ex partner: Not on file    Emotionally abused: Not on file    Physically abused: Not on file    Forced sexual activity: Not on file  Other Topics Concern  . Not on file  Social History Narrative   Lives with wife, daughter, son and dog   Right handed   Caffeine: 1 cup of coffee daily    Family History  Problem Relation Age of Onset  . Heart disease Father        CABG in mid 48's  . Hypertension Father   . Alzheimer's disease Father   . Hyperlipidemia Father   . Diabetes Father   . Hyperlipidemia Mother   . Hyperlipidemia Sister   . Hyperlipidemia Brother   . Hypertension Brother   . Hyperlipidemia Brother   . Hyperlipidemia Sister    . Melanoma Sister   . Breast cancer Sister 43  . Cancer Sister   . Hyperlipidemia Sister   . Breast cancer Sister 69  . Cancer Sister   . Hyperlipidemia Sister   . Hyperlipidemia Sister   . Hyperlipidemia Sister   . Hyperlipidemia Sister   . Asthma Son     Outpatient Encounter Medications as of 05/30/2019  Medication Sig  . atorvastatin (LIPITOR) 40 MG tablet TAKE 1 TABLET BY MOUTH DAILY.  Marland Kitchen zolpidem (AMBIEN) 10 MG tablet TAKE 1 TABLET BY MOUTH NIGHTLY AT BEDTIME AS NEEDED  . valACYclovir (VALTREX) 500 MG tablet TAKE 1 TABLET  BY MOUTH TWICE A DAY FOR 3 DAYS AS NEEDED FOR OUTBREAKS (Patient not taking: Reported on 05/30/2019)  . [DISCONTINUED] cyclobenzaprine (FLEXERIL) 10 MG tablet Take 0.5-1 tablets (5-10 mg total) by mouth 3 (three) times daily as needed for muscle spasms.  . [DISCONTINUED] nortriptyline (PAMELOR) 10 MG capsule Take 1 capsule (10 mg total) by mouth at bedtime. Can increase to 2omg (2 capsules) in 1 week as needed and tolerated.  . [DISCONTINUED] oseltamivir (TAMIFLU) 75 MG capsule Take 1 capsule (75 mg total) by mouth 2 (two) times daily.   No facility-administered encounter medications on file as of 05/30/2019.     No Known Allergies   ROS: The patient denies anorexia, fever, weight changes, vision loss, decreased hearing, ear pain, hoarseness, chest pain, dizziness, syncope, dyspnea on exertion, cough, swelling, nausea, vomiting, diarrhea, abdominal pain, melena, hematochezia, indigestion/heartburn, hematuria, incontinence, erectile dysfunction, nocturia, weakened urine stream, dysuria, genital lesions, numbness, tingling, weakness, tremor, suspicious skin lesions, depression, anxiety, abnormal bleeding/bruising, or enlarged lymph nodes.  Herpes flares are infrequent. Right lateral epicondylitis per HPI, having persistent problems and plans to f/u with ortho. Headaches per HPI, improved with botox injections. Denies vertigo, dysequilibrium or other neuro  symptoms. H/oPlantar fasciitis, hasorthotics, no recent problems. Some constipation, chronic, constipation. Rarely has some BRB and discomfort with passing a stool (when constipated), 1-2x/year, last in July. Insomnia--per HPI. Goes to the dermatologist yearly.  No current skin concerns.   Pulled his right groin 6-8 weeks ago, improving   PHYSICAL EXAM:  BP 130/80   Pulse 80   Temp (!) 97.3 F (36.3 C) (Tympanic)   Ht _0  (1.854 m)   Wt 222 lb (100.7 kg)   BMI 29.29 kg/m   Wt Readings from Last 3 Encounters:  05/30/19 222 lb (100.7 kg)  10/24/18 225 lb (102.1 kg)  10/11/18 222 lb (100.7 kg)    General Appearance:  Alert, cooperative, no distress, appears stated age   Head:  Normocephalic, without obvious abnormality, atraumatic   Eyes:  PERRL, conjunctiva/corneas clear, EOM's intact, fundi benign   Ears:  Normal TMsand EACs bilaterally  Nose:  Not examined, wearing mask due to COVID-19 pandemic  Throat:  Not examined, wearing mask due to COVID-19 pandemic  Neck:  Supple, no lymphadenopathy; thyroid: no enlargement/ tenderness/nodules; no carotid bruit or JVD   Back:  Spine nontender, no curvature, ROM normal, no CVA tenderness   Lungs:  Clear to auscultation bilaterally without wheezes, rales or ronchi; respirations unlabored   Chest Wall:  No tenderness or deformity   Heart:  Regular rate and rhythm, S1 and S2 normal, no murmur, rub or gallop   Breast Exam:  No chest wall tenderness, masses or gynecomastia   Abdomen:  Soft, non-tender, nondistended, normoactive bowel sounds, no masses, no hepatosplenomegaly.  Genitalia:  Normal male external genitalia without lesions. Testicles without masses (some scrotal masses related to prior vasectomy present inferoposterior to R testicle, unchanged). No inguinal hernias.   Rectal:  Normal sphincter tone, no masses or tenderness; guaiac negative stool. Prostate smooth, no nodules, not enlarged.    Extremities:  No clubbing, cyanosis or edema (right elbow not examined, pt reports very painful/tender at lateral epicondyle)  Pulses:  2+ and symmetric all extremities   Skin:  Skin color, texture, turgor normal, no lesions.  Lymph nodes:  Cervical, supraclavicular, and inguinal nodes normal   Neurologic:  Normal strength, sensation and gait; reflexes 2+ and symmetric throughout    Psych: Normal mood, affect, hygiene and grooming  ASSESSMENT/PLAN:  Annual physical exam - Plan: CBC with Differential/Platelet, Comprehensive metabolic panel, Lipid panel  Pure hypercholesterolemia - reviewed lowfat, low cholesterol diet. If lipids not at goal, discussed increasing dose to 62m, and if not tolerated, change to Crestor 427m- Plan: Lipid panel  Genital herpes simplex, unspecified site - infrequent flares, still has Valtrex for prn use  Medication monitoring encounter - Plan: CBC with Differential/Platelet, Comprehensive metabolic panel, Lipid panel  Intractable chronic migraine without aura and without status migrainosus - improved with Botox injections q12 weeks  Screen for colon cancer - Plan: Ambulatory referral to Gastroenterology  Right lateral epicondylitis - to f/u with ortho to discuss treatment options   Flu shot--will get through work, declines today.  c-met, lipid, CBC  Recommended at least 30 minutes of aerobic activity at least 5 days/week, weight bearing exercise 2x/wk; proper sunscreen use reviewed; healthy diet and alcohol recommendations (less than or equal to 2 drinks/day) reviewed; regular seatbelt use; changing batteries in smoke detectors. Self-testicular exams. Immunization recommendations discussed--continue yearly flu shots. Shingrix recommended at age [age]risks/SE reviewed. Colonoscopy recommendations reviewed--he will be 48 in 2 months, referred for colonoscopy in November.  F/u 1 year, sooner prn, based on lab results/med changes

## 2019-05-28 NOTE — Patient Instructions (Signed)
  HEALTH MAINTENANCE RECOMMENDATIONS:  It is recommended that you get at least 30 minutes of aerobic exercise at least 5 days/week (for weight loss, you may need as much as 60-90 minutes). This can be any activity that gets your heart rate up. This can be divided in 10-15 minute intervals if needed, but try and build up your endurance at least once a week.  Weight bearing exercise is also recommended twice weekly.  Eat a healthy diet with lots of vegetables, fruits and fiber.  "Colorful" foods have a lot of vitamins (ie green vegetables, tomatoes, red peppers, etc).  Limit sweet tea, regular sodas and alcoholic beverages, all of which has a lot of calories and sugar.  Up to 2 alcoholic drinks daily may be beneficial for men (unless trying to lose weight, watch sugars).  Drink a lot of water.  Sunscreen of at least SPF 30 should be used on all sun-exposed parts of the skin when outside between the hours of 10 am and 4 pm (not just when at beach or pool, but even with exercise, golf, tennis, and yard work!)  Use a sunscreen that says "broad spectrum" so it covers both UVA and UVB rays, and make sure to reapply every 1-2 hours.  Remember to change the batteries in your smoke detectors when changing your clock times in the spring and fall. Carbon monoxide detectors are recommended for your home.  Use your seat belt every time you are in a car, and please drive safely and not be distracted with cell phones and texting while driving.  I recommend getting the new shingles vaccine (Shingrix) after age 23. You will need to check with your insurance to see if it is covered, and if covered, schedule a nurse visit when convenient.  It is a series of 2 injections, spaced 2 months apart.  We will be referring you for routine colonoscopy.

## 2019-05-30 ENCOUNTER — Encounter: Payer: Self-pay | Admitting: Family Medicine

## 2019-05-30 ENCOUNTER — Ambulatory Visit (INDEPENDENT_AMBULATORY_CARE_PROVIDER_SITE_OTHER): Payer: 59 | Admitting: Family Medicine

## 2019-05-30 ENCOUNTER — Other Ambulatory Visit: Payer: Self-pay

## 2019-05-30 VITALS — BP 130/80 | HR 80 | Temp 97.3°F | Ht 73.0 in | Wt 222.0 lb

## 2019-05-30 DIAGNOSIS — Z5181 Encounter for therapeutic drug level monitoring: Secondary | ICD-10-CM | POA: Diagnosis not present

## 2019-05-30 DIAGNOSIS — Z1211 Encounter for screening for malignant neoplasm of colon: Secondary | ICD-10-CM

## 2019-05-30 DIAGNOSIS — M7711 Lateral epicondylitis, right elbow: Secondary | ICD-10-CM | POA: Diagnosis not present

## 2019-05-30 DIAGNOSIS — Z0000 Encounter for general adult medical examination without abnormal findings: Secondary | ICD-10-CM | POA: Diagnosis not present

## 2019-05-30 DIAGNOSIS — E7800 Pure hypercholesterolemia, unspecified: Secondary | ICD-10-CM

## 2019-05-30 DIAGNOSIS — A6000 Herpesviral infection of urogenital system, unspecified: Secondary | ICD-10-CM

## 2019-05-30 DIAGNOSIS — G43719 Chronic migraine without aura, intractable, without status migrainosus: Secondary | ICD-10-CM | POA: Diagnosis not present

## 2019-05-31 LAB — LIPID PANEL
Chol/HDL Ratio: 3.3 ratio (ref 0.0–5.0)
Cholesterol, Total: 180 mg/dL (ref 100–199)
HDL: 54 mg/dL (ref >39)
LDL Chol Calc (NIH): 100 mg/dL — ABNORMAL HIGH (ref 0–99)
Triglycerides: 152 mg/dL — ABNORMAL HIGH (ref 0–149)
VLDL Cholesterol Cal: 26 mg/dL (ref 5–40)

## 2019-05-31 LAB — COMPREHENSIVE METABOLIC PANEL
ALT: 30 IU/L (ref 0–44)
AST: 32 IU/L (ref 0–40)
Albumin/Globulin Ratio: 2.3 — ABNORMAL HIGH (ref 1.2–2.2)
Albumin: 5 g/dL (ref 4.0–5.0)
Alkaline Phosphatase: 81 IU/L (ref 39–117)
BUN/Creatinine Ratio: 10 (ref 9–20)
BUN: 9 mg/dL (ref 6–24)
Bilirubin Total: 0.9 mg/dL (ref 0.0–1.2)
CO2: 26 mmol/L (ref 20–29)
Calcium: 9.7 mg/dL (ref 8.7–10.2)
Chloride: 100 mmol/L (ref 96–106)
Creatinine, Ser: 0.9 mg/dL (ref 0.76–1.27)
GFR calc Af Amer: 116 mL/min/{1.73_m2} (ref >59)
GFR calc non Af Amer: 100 mL/min/{1.73_m2} (ref >59)
Globulin, Total: 2.2 g/dL (ref 1.5–4.5)
Glucose: 93 mg/dL (ref 65–99)
Potassium: 4.8 mmol/L (ref 3.5–5.2)
Sodium: 141 mmol/L (ref 134–144)
Total Protein: 7.2 g/dL (ref 6.0–8.5)

## 2019-05-31 LAB — CBC WITH DIFFERENTIAL/PLATELET
Basophils Absolute: 0 10*3/uL (ref 0.0–0.2)
Basos: 0 %
EOS (ABSOLUTE): 0.2 10*3/uL (ref 0.0–0.4)
Eos: 3 %
Hematocrit: 47.7 % (ref 37.5–51.0)
Hemoglobin: 16 g/dL (ref 13.0–17.7)
Immature Grans (Abs): 0 10*3/uL (ref 0.0–0.1)
Immature Granulocytes: 0 %
Lymphocytes Absolute: 1.7 10*3/uL (ref 0.7–3.1)
Lymphs: 27 %
MCH: 30.9 pg (ref 26.6–33.0)
MCHC: 33.5 g/dL (ref 31.5–35.7)
MCV: 92 fL (ref 79–97)
Monocytes Absolute: 0.5 10*3/uL (ref 0.1–0.9)
Monocytes: 8 %
Neutrophils Absolute: 3.8 10*3/uL (ref 1.4–7.0)
Neutrophils: 62 %
Platelets: 202 10*3/uL (ref 150–450)
RBC: 5.17 x10E6/uL (ref 4.14–5.80)
RDW: 12.9 % (ref 11.6–15.4)
WBC: 6.2 10*3/uL (ref 3.4–10.8)

## 2019-07-18 DIAGNOSIS — L111 Transient acantholytic dermatosis [Grover]: Secondary | ICD-10-CM | POA: Diagnosis not present

## 2019-07-18 DIAGNOSIS — Z23 Encounter for immunization: Secondary | ICD-10-CM | POA: Diagnosis not present

## 2019-07-18 DIAGNOSIS — L738 Other specified follicular disorders: Secondary | ICD-10-CM | POA: Diagnosis not present

## 2019-07-18 DIAGNOSIS — D225 Melanocytic nevi of trunk: Secondary | ICD-10-CM | POA: Diagnosis not present

## 2019-07-18 DIAGNOSIS — L570 Actinic keratosis: Secondary | ICD-10-CM | POA: Diagnosis not present

## 2019-07-18 DIAGNOSIS — Z86018 Personal history of other benign neoplasm: Secondary | ICD-10-CM | POA: Diagnosis not present

## 2019-07-18 DIAGNOSIS — D1801 Hemangioma of skin and subcutaneous tissue: Secondary | ICD-10-CM | POA: Diagnosis not present

## 2019-07-18 DIAGNOSIS — L814 Other melanin hyperpigmentation: Secondary | ICD-10-CM | POA: Diagnosis not present

## 2019-07-29 ENCOUNTER — Encounter: Payer: Self-pay | Admitting: Family Medicine

## 2019-07-29 ENCOUNTER — Encounter: Payer: Self-pay | Admitting: Gastroenterology

## 2019-08-06 ENCOUNTER — Ambulatory Visit (AMBULATORY_SURGERY_CENTER): Payer: 59 | Admitting: *Deleted

## 2019-08-06 ENCOUNTER — Other Ambulatory Visit: Payer: Self-pay | Admitting: Family Medicine

## 2019-08-06 ENCOUNTER — Other Ambulatory Visit: Payer: Self-pay

## 2019-08-06 ENCOUNTER — Encounter: Payer: Self-pay | Admitting: Gastroenterology

## 2019-08-06 VITALS — Temp 97.1°F | Ht 73.0 in | Wt 220.0 lb

## 2019-08-06 DIAGNOSIS — Z1211 Encounter for screening for malignant neoplasm of colon: Secondary | ICD-10-CM

## 2019-08-06 DIAGNOSIS — G4700 Insomnia, unspecified: Secondary | ICD-10-CM

## 2019-08-06 DIAGNOSIS — E7800 Pure hypercholesterolemia, unspecified: Secondary | ICD-10-CM

## 2019-08-06 DIAGNOSIS — Z1159 Encounter for screening for other viral diseases: Secondary | ICD-10-CM

## 2019-08-06 DIAGNOSIS — A6000 Herpesviral infection of urogenital system, unspecified: Secondary | ICD-10-CM

## 2019-08-06 MED ORDER — SUPREP BOWEL PREP KIT 17.5-3.13-1.6 GM/177ML PO SOLN: ORAL | 0 refills | Status: DC

## 2019-08-06 MED FILL — SUPREP BOWEL PREP KIT: 17.5-3.13-1 | 1 days supply | Qty: 354 | Fill #0

## 2019-08-06 NOTE — Progress Notes (Signed)
No egg or soy allergy  No home oxygen use or problems with anesthesia  No medications for weight loss taken  emmi information given  Pt is aware that care partner will wait in the car during procedure; if they feel like they will be too hot or cold to wait in the car; they may wait in the 4 th floor lobby. Patient is aware to bring only one care partner. We want them to wear a mask (we do not have any that we can provide them), practice social distancing, and we will check their temperatures when they get here.  I did remind the patient that their care partner needs to stay in the parking lot the entire time and have a cell phone available, we will call them when the pt is ready for discharge. Patient will wear mask into building.

## 2019-08-07 ENCOUNTER — Encounter: Payer: Self-pay | Admitting: Family Medicine

## 2019-08-07 ENCOUNTER — Ambulatory Visit (INDEPENDENT_AMBULATORY_CARE_PROVIDER_SITE_OTHER): Payer: 59 | Admitting: Family Medicine

## 2019-08-07 ENCOUNTER — Other Ambulatory Visit: Payer: Self-pay

## 2019-08-07 DIAGNOSIS — G43719 Chronic migraine without aura, intractable, without status migrainosus: Secondary | ICD-10-CM | POA: Diagnosis not present

## 2019-08-07 MED FILL — VALACYCLOVIR HCL 500 MG TAB: 500 | 15 days supply | Qty: 30 | Fill #0

## 2019-08-07 MED FILL — ZOLPIDEM TARTRATE 10 MG TAB: 10 | 30 days supply | Qty: 30 | Fill #0

## 2019-08-07 MED FILL — ATORVASTATIN 40 MG TABLET: 40 | 90 days supply | Qty: 90 | Fill #0

## 2019-08-07 NOTE — Progress Notes (Signed)
Botox- 100 units x 4 vials Lot: [id number] Expiration: 03/2022 NDC: [id number]  Bacteriostatic 0.9% Sodium Chloride- 24mL total Lot: [id number] Expiration: 03/19/2020 NDC: [id number]  Dx: [id number] B/B

## 2019-08-07 NOTE — Telephone Encounter (Signed)
Are these okay to refill? 

## 2019-08-07 NOTE — Progress Notes (Signed)
Consent Form Botulism Toxin Injection For Chronic Migraine    Reviewed orally with patient, additionally signature is on file:  Botulism toxin has been approved by the Federal drug administration for treatment of chronic migraine. Botulism toxin does not cure chronic migraine and it may not be effective in some patients.  The administration of botulism toxin is accomplished by injecting a small amount of toxin into the muscles of the neck and head. Dosage must be titrated for each individual. Any benefits resulting from botulism toxin tend to wear off after 3 months with a repeat injection required if benefit is to be maintained. Injections are usually done every 3-4 months with maximum effect peak achieved by about 2 or 3 weeks. Botulism toxin is expensive and you should be sure of what costs you will incur resulting from the injection.  The side effects of botulism toxin use for chronic migraine may include:   -Transient, and usually mild, facial weakness with facial injections  -Transient, and usually mild, head or neck weakness with head/neck injections  -Reduction or loss of forehead facial animation due to forehead muscle weakness  -Eyelid drooping  -Dry eye  -Pain at the site of injection or bruising at the site of injection  -Double vision  -Potential unknown long term risks   Contraindications: You should not have Botox if you are pregnant, nursing, allergic to albumin, have an infection, skin condition, or muscle weakness at the site of the injection, or have myasthenia gravis, Lambert-Eaton syndrome, or ALS.  It is also possible that as with any injection, there may be an allergic reaction or no effect from the medication. Reduced effectiveness after repeated injections is sometimes seen and rarely infection at the injection site may occur. All care will be taken to prevent these side effects. If therapy is given over a long time, atrophy and wasting in the muscle injected may  occur. Occasionally the patient's become refractory to treatment because they develop antibodies to the toxin. In this event, therapy needs to be modified.  I have read the above information and consent to the administration of botulism toxin.    BOTOX PROCEDURE NOTE FOR MIGRAINE HEADACHE  Contraindications and precautions discussed with patient(above). Aseptic procedure was observed and patient tolerated procedure. Procedure performed by Debbora Presto, FNP-C.   The condition has existed for more than 6 months, and pt does not have a diagnosis of ALS, Myasthenia Gravis or Lambert-Eaton Syndrome.  Risks and benefits of injections discussed and pt agrees to proceed with the procedure.  Written consent obtained  These injections are medically necessary. Pt  receives good benefits from these injections. These injections do not cause sedations or hallucinations which the oral therapies may cause.   Description of procedure:  The patient was placed in a sitting position. The standard protocol was used for Botox as follows, with 5 units of Botox injected at each site:  -Procerus muscle, midline injection  -Corrugator muscle, bilateral injection  -Frontalis muscle, bilateral injection, with 2 sites each side, medial injection was performed in the upper one third of the frontalis muscle, in the region vertical from the medial inferior edge of the superior orbital rim. The lateral injection was again in the upper one third of the forehead vertically above the lateral limbus of the cornea, 1.5 cm lateral to the medial injection site.  -Temporalis muscle injection, 4 sites, bilaterally. The first injection was 3 cm above the tragus of the ear, second injection site was 1.5 cm to  3 cm up from the first injection site in line with the tragus of the ear. The third injection site was 1.5-3 cm forward between the first 2 injection sites. The fourth injection site was 1.5 cm posterior to the second injection  site. 5th site laterally in the temporalis  muscleat the level of the outer canthus.  -Occipitalis muscle injection, 3 sites, bilaterally. The first injection was done one half way between the occipital protuberance and the tip of the mastoid process behind the ear. The second injection site was done lateral and superior to the first, 1 fingerbreadth from the first injection. The third injection site was 1 fingerbreadth superiorly and medially from the first injection site.  -Cervical paraspinal muscle injection, 2 sites, bilateral knee first injection site was 1 cm from the midline of the cervical spine, 3 cm inferior to the lower border of the occipital protuberance. The second injection site was 1.5 cm superiorly and laterally to the first injection site.  -Trapezius muscle injection was performed at 3 sites, bilaterally. The first injection site was in the upper trapezius muscle halfway between the inflection point of the neck, and the acromion. The second injection site was one half way between the acromion and the first injection site. The third injection was done between the first injection site and the inflection point of the neck.   Will return for repeat injection in 3 months.   A 155 units of Botox was used, any Botox not injected was wasted. The patient tolerated the procedure well, there were no complications of the above procedure.

## 2019-08-13 ENCOUNTER — Other Ambulatory Visit: Payer: Self-pay | Admitting: Gastroenterology

## 2019-08-13 ENCOUNTER — Ambulatory Visit (INDEPENDENT_AMBULATORY_CARE_PROVIDER_SITE_OTHER): Payer: 59

## 2019-08-13 DIAGNOSIS — Z1159 Encounter for screening for other viral diseases: Secondary | ICD-10-CM

## 2019-08-14 DIAGNOSIS — M7711 Lateral epicondylitis, right elbow: Secondary | ICD-10-CM | POA: Diagnosis not present

## 2019-08-14 LAB — SARS CORONAVIRUS 2 (TAT 6-24 HRS): SARS Coronavirus 2: NEGATIVE

## 2019-08-20 ENCOUNTER — Encounter: Payer: Self-pay | Admitting: Gastroenterology

## 2019-08-20 ENCOUNTER — Ambulatory Visit (AMBULATORY_SURGERY_CENTER): Payer: 59 | Admitting: Gastroenterology

## 2019-08-20 ENCOUNTER — Other Ambulatory Visit: Payer: Self-pay

## 2019-08-20 VITALS — BP 132/87 | HR 70 | Temp 97.7°F | Resp 21 | Ht 73.0 in | Wt 220.0 lb

## 2019-08-20 DIAGNOSIS — D120 Benign neoplasm of cecum: Secondary | ICD-10-CM | POA: Diagnosis not present

## 2019-08-20 DIAGNOSIS — E669 Obesity, unspecified: Secondary | ICD-10-CM | POA: Diagnosis not present

## 2019-08-20 DIAGNOSIS — D124 Benign neoplasm of descending colon: Secondary | ICD-10-CM

## 2019-08-20 DIAGNOSIS — D123 Benign neoplasm of transverse colon: Secondary | ICD-10-CM | POA: Diagnosis not present

## 2019-08-20 DIAGNOSIS — E785 Hyperlipidemia, unspecified: Secondary | ICD-10-CM | POA: Diagnosis not present

## 2019-08-20 DIAGNOSIS — Z1211 Encounter for screening for malignant neoplasm of colon: Secondary | ICD-10-CM

## 2019-08-20 MED ORDER — SODIUM CHLORIDE 0.9 % IV SOLN: 500.0000 mL | Freq: Once | INTRAVENOUS | Status: DC

## 2019-08-20 NOTE — Progress Notes (Signed)
Called to room to assist during endoscopic procedure.  Patient ID and intended procedure confirmed with present staff. Received instructions for my participation in the procedure from the performing physician.  

## 2019-08-20 NOTE — Progress Notes (Signed)
Pt's states no medical or surgical changes since previsit or office visit.  Temp-jb  V/s-cw 

## 2019-08-20 NOTE — Patient Instructions (Signed)
Please read handouts provided. Continue present medications. Await pathology. No aspirin, ibuprofen, naproxen, or other non-steriodal anti-inflammatory drugs for 2 weeks.       YOU HAD AN ENDOSCOPIC PROCEDURE TODAY AT [facility]:   Refer to the procedure report that was given to you for any specific questions about what was found during the examination.  If the procedure report does not answer your questions, please call your gastroenterologist to clarify.  If you requested that your care partner not be given the details of your procedure findings, then the procedure report has been included in a sealed envelope for you to review at your convenience later.  YOU SHOULD EXPECT: Some feelings of bloating in the abdomen. Passage of more gas than usual.  Walking can help get rid of the air that was put into your GI tract during the procedure and reduce the bloating. If you had a lower endoscopy (such as a colonoscopy or flexible sigmoidoscopy) you may notice spotting of blood in your stool or on the toilet paper. If you underwent a bowel prep for your procedure, you may not have a normal bowel movement for a few days.  Please Note:  You might notice some irritation and congestion in your nose or some drainage.  This is from the oxygen used during your procedure.  There is no need for concern and it should clear up in a day or so.  SYMPTOMS TO REPORT IMMEDIATELY:   Following lower endoscopy (colonoscopy or flexible sigmoidoscopy):  Excessive amounts of blood in the stool  Significant tenderness or worsening of abdominal pains  Swelling of the abdomen that is new, acute  Fever of 100F or higher   For urgent or emergent issues, a gastroenterologist can be reached at any hour by calling [phone].   DIET:  We do recommend a small meal at first, but then you may proceed to your regular diet.  Drink plenty of fluids but you should avoid alcoholic beverages for 24  hours.  ACTIVITY:  You should plan to take it easy for the rest of today and you should NOT DRIVE or use heavy machinery until tomorrow (because of the sedation medicines used during the test).    FOLLOW UP: Our staff will call the number listed on your records 48-72 hours following your procedure to check on you and address any questions or concerns that you may have regarding the information given to you following your procedure. If we do not reach you, we will leave a message.  We will attempt to reach you two times.  During this call, we will ask if you have developed any symptoms of COVID 19. If you develop any symptoms (ie: fever, flu-like symptoms, shortness of breath, cough etc.) before then, please call [phone].  If you test positive for Covid 19 in the 2 weeks post procedure, please call and report this information to [place].    If any biopsies were taken you will be contacted by phone or by letter within the next 1-3 weeks.  Please call us at [phone] if you have not heard about the biopsies in 3 weeks.    SIGNATURES/CONFIDENTIALITY: You and/or your care partner have signed paperwork which will be entered into your electronic medical record.  These signatures attest to the fact that that the information above on your After Visit Summary has been reviewed and is understood.  Full responsibility of the confidentiality of this discharge information lies with you and/or your care-partner.

## 2019-08-20 NOTE — Progress Notes (Signed)
Report to PACU, RN, vss, BBS= Clear.  

## 2019-08-20 NOTE — Op Note (Signed)
Larry Cruz Patient Name: Larry Cruz Procedure Date: 08/20/2019 7:45 AM MRN: [id number] Endoscopist: Ladene Artist , MD Age: 48 Referring MD:  Date of Birth: 05-05-1971 Gender: Male Account #: [id number] Procedure:                Colonoscopy Indications:              Screening for colorectal malignant neoplasm Medicines:                Monitored Anesthesia Care Procedure:                Pre-Anesthesia Assessment:                           - Prior to the procedure, a History and Physical                            was performed, and patient medications and                            allergies were reviewed. The patient's tolerance of                            previous anesthesia was also reviewed. The risks                            and benefits of the procedure and the sedation                            options and risks were discussed with the patient.                            All questions were answered, and informed consent                            was obtained. Prior Anticoagulants: The patient has                            taken no previous anticoagulant or antiplatelet                            agents. ASA Grade Assessment: II - A patient with                            mild systemic disease. After reviewing the risks                            and benefits, the patient was deemed in                            satisfactory condition to undergo the procedure.                           After obtaining informed consent, the colonoscope  was passed under direct vision. Throughout the                            procedure, the patient's blood pressure, pulse, and                            oxygen saturations were monitored continuously. The                            Colonoscope was introduced through the anus and                            advanced to the the cecum, identified by                            appendiceal orifice and  ileocecal valve. The                            ileocecal valve, appendiceal orifice, and rectum                            were photographed. The quality of the bowel                            preparation was good. The patient tolerated the                            procedure well. The colonoscopy was somewhat                            difficult due to restricted mobility of the colon,                            significant looping and a tortuous colon.                            Successful completion of the procedure was aided by                            withdrawing and reinserting the scope,                            straightening and shortening the scope to obtain                            bowel loop reduction, using scope torsion and                            applying abdominal pressure. Scope In: 7:58:25 AM Scope Out: 8:19:25 AM Scope Withdrawal Time: 0 hours 14 minutes 36 seconds  Total Procedure Duration: 0 hours 21 minutes 0 seconds  Findings:                 The perianal and digital rectal examinations were  normal.                           A 10 mm polyp was found in the cecum. The polyp was                            sessile. The polyp was removed with a hot snare.                            Resection and retrieval were complete.                           Two sessile polyps were found in the descending                            colon and transverse colon. The polyps were 7 mm in                            size. These polyps were removed with a cold snare.                            Resection and retrieval were complete.                           Internal hemorrhoids were found during                            retroflexion. The hemorrhoids were medium-sized and                            Grade I (internal hemorrhoids that do not prolapse).                           The exam was otherwise without abnormality on                             direct and retroflexion views. Complications:            No immediate complications. Estimated blood loss:                            None. Estimated Blood Loss:     Estimated blood loss: none. Impression:               - One 10 mm polyp in the cecum, removed with a hot                            snare. Resected and retrieved.                           - Two 7 mm polyps in the descending colon and in                            the transverse colon, removed with a cold snare.  Resected and retrieved.                           - Internal hemorrhoids.                           - The examination was otherwise normal on direct                            and retroflexion views. Recommendation:           - Repeat colonoscopy after studies are complete for                            surveillance based on pathology results.                           - Patient has a contact number available for                            emergencies. The signs and symptoms of potential                            delayed complications were discussed with the                            patient. Return to normal activities tomorrow.                            Written discharge instructions were provided to the                            patient.                           - Resume previous diet.                           - Continue present medications.                           - Await pathology results.                           - No aspirin, ibuprofen, naproxen, or other                            non-steroidal anti-inflammatory drugs for 2 weeks                            after polyp removal. Ladene Artist, MD 08/20/2019 8:23:18 AM This report has been signed electronically.

## 2019-08-22 ENCOUNTER — Telehealth: Payer: Self-pay | Admitting: *Deleted

## 2019-08-22 NOTE — Telephone Encounter (Signed)
  Follow up Call-  Call back number 08/20/2019  Post procedure Call Back phone  # [phone]  Permission to leave phone message Yes  Some recent data might be hidden     Patient questions:  Do you have a fever, pain , or abdominal swelling? No. Pain Score  0 *  Have you tolerated food without any problems? Yes.    Have you been able to return to your normal activities? Yes.    Do you have any questions about your discharge instructions: Diet   No. Medications  No. Follow up visit  No.  Do you have questions or concerns about your Care? No.  Actions: * If pain score is 4 or above: No action needed, pain <4.  1. Have you developed a fever since your procedure? no  2.   Have you had an respiratory symptoms (SOB or cough) since your procedure? no  3.   Have you tested positive for COVID 19 since your procedure no  4.   Have you had any family members/close contacts diagnosed with the COVID 19 since your procedure?  no   If yes to any of these questions please route to Joylene John, RN and Alphonsa Gin, Therapist, sports.

## 2019-08-27 ENCOUNTER — Telehealth: Payer: Self-pay

## 2019-08-27 ENCOUNTER — Telehealth: Payer: 59 | Admitting: Family Medicine

## 2019-08-27 DIAGNOSIS — H524 Presbyopia: Secondary | ICD-10-CM | POA: Diagnosis not present

## 2019-08-27 MED FILL — CEPHALEXIN 500 MG CAPSULE: 500 | 8 days supply | Qty: 32 | Fill #0

## 2019-08-27 NOTE — Telephone Encounter (Signed)
Patient stated that he would call back later today to r/s his appt with Amy. Office number was provided.

## 2019-08-30 ENCOUNTER — Encounter: Payer: Self-pay | Admitting: Gastroenterology

## 2019-09-02 DIAGNOSIS — Z9889 Other specified postprocedural states: Secondary | ICD-10-CM

## 2019-09-02 DIAGNOSIS — Z4789 Encounter for other orthopedic aftercare: Secondary | ICD-10-CM | POA: Diagnosis not present

## 2019-09-02 DIAGNOSIS — M7711 Lateral epicondylitis, right elbow: Secondary | ICD-10-CM | POA: Diagnosis not present

## 2019-09-02 HISTORY — DX: Other specified postprocedural states: Z98.890

## 2019-09-02 HISTORY — PX: ELBOW SURGERY: SHX618

## 2019-09-02 MED FILL — HYDROCODON-APAP 5-325: 5-325 | 7 days supply | Qty: 40 | Fill #0

## 2019-10-21 DIAGNOSIS — M7711 Lateral epicondylitis, right elbow: Secondary | ICD-10-CM | POA: Diagnosis not present

## 2019-10-21 DIAGNOSIS — Z4789 Encounter for other orthopedic aftercare: Secondary | ICD-10-CM | POA: Diagnosis not present

## 2019-10-23 ENCOUNTER — Encounter: Payer: Self-pay | Admitting: *Deleted

## 2019-11-12 ENCOUNTER — Telehealth: Payer: Self-pay

## 2019-11-12 NOTE — Telephone Encounter (Signed)
I called the patients insurance and spoke with representative Kyla P. who stated [place] was required for codes [phone] and [phone] [id number]

## 2019-11-13 ENCOUNTER — Other Ambulatory Visit: Payer: Self-pay

## 2019-11-13 ENCOUNTER — Encounter: Payer: Self-pay | Admitting: Family Medicine

## 2019-11-13 ENCOUNTER — Ambulatory Visit (INDEPENDENT_AMBULATORY_CARE_PROVIDER_SITE_OTHER): Payer: 59 | Admitting: Family Medicine

## 2019-11-13 DIAGNOSIS — G43719 Chronic migraine without aura, intractable, without status migrainosus: Secondary | ICD-10-CM | POA: Diagnosis not present

## 2019-11-13 NOTE — Progress Notes (Signed)
Consent Form Botulism Toxin Injection For Chronic Migraine    Reviewed orally with patient, additionally signature is on file:  Botulism toxin has been approved by the Federal drug administration for treatment of chronic migraine. Botulism toxin does not cure chronic migraine and it may not be effective in some patients.  The administration of botulism toxin is accomplished by injecting a small amount of toxin into the muscles of the neck and head. Dosage must be titrated for each individual. Any benefits resulting from botulism toxin tend to wear off after 3 months with a repeat injection required if benefit is to be maintained. Injections are usually done every 3-4 months with maximum effect peak achieved by about 2 or 3 weeks. Botulism toxin is expensive and you should be sure of what costs you will incur resulting from the injection.  The side effects of botulism toxin use for chronic migraine may include:   -Transient, and usually mild, facial weakness with facial injections  -Transient, and usually mild, head or neck weakness with head/neck injections  -Reduction or loss of forehead facial animation due to forehead muscle weakness  -Eyelid drooping  -Dry eye  -Pain at the site of injection or bruising at the site of injection  -Double vision  -Potential unknown long term risks   Contraindications: You should not have Botox if you are pregnant, nursing, allergic to albumin, have an infection, skin condition, or muscle weakness at the site of the injection, or have myasthenia gravis, Lambert-Eaton syndrome, or ALS.  It is also possible that as with any injection, there may be an allergic reaction or no effect from the medication. Reduced effectiveness after repeated injections is sometimes seen and rarely infection at the injection site may occur. All care will be taken to prevent these side effects. If therapy is given over a long time, atrophy and wasting in the muscle injected may  occur. Occasionally the patient's become refractory to treatment because they develop antibodies to the toxin. In this event, therapy needs to be modified.  I have read the above information and consent to the administration of botulism toxin.    BOTOX PROCEDURE NOTE FOR MIGRAINE HEADACHE  Contraindications and precautions discussed with patient(above). Aseptic procedure was observed and patient tolerated procedure. Procedure performed by Debbora Presto, FNP-C.   The condition has existed for more than 6 months, and pt does not have a diagnosis of ALS, Myasthenia Gravis or Lambert-Eaton Syndrome.  Risks and benefits of injections discussed and pt agrees to proceed with the procedure.  Written consent obtained  These injections are medically necessary. Pt  receives good benefits from these injections. These injections do not cause sedations or hallucinations which the oral therapies may cause.   Description of procedure:  The patient was placed in a sitting position. The standard protocol was used for Botox as follows, with 5 units of Botox injected at each site:  -Procerus muscle, midline injection  -Corrugator muscle, bilateral injection  -Frontalis muscle, bilateral injection, with 2 sites each side, medial injection was performed in the upper one third of the frontalis muscle, in the region vertical from the medial inferior edge of the superior orbital rim. The lateral injection was again in the upper one third of the forehead vertically above the lateral limbus of the cornea, 1.5 cm lateral to the medial injection site.  -Temporalis muscle injection, 4 sites, bilaterally. The first injection was 3 cm above the tragus of the ear, second injection site was 1.5 cm to  3 cm up from the first injection site in line with the tragus of the ear. The third injection site was 1.5-3 cm forward between the first 2 injection sites. The fourth injection site was 1.5 cm posterior to the second injection  site. 5th site laterally in the temporalis  muscleat the level of the outer canthus.  -Occipitalis muscle injection, 3 sites, bilaterally. The first injection was done one half way between the occipital protuberance and the tip of the mastoid process behind the ear. The second injection site was done lateral and superior to the first, 1 fingerbreadth from the first injection. The third injection site was 1 fingerbreadth superiorly and medially from the first injection site.  -Cervical paraspinal muscle injection, 2 sites, bilateral knee first injection site was 1 cm from the midline of the cervical spine, 3 cm inferior to the lower border of the occipital protuberance. The second injection site was 1.5 cm superiorly and laterally to the first injection site.  -Trapezius muscle injection was performed at 3 sites, bilaterally. The first injection site was in the upper trapezius muscle halfway between the inflection point of the neck, and the acromion. The second injection site was one half way between the acromion and the first injection site. The third injection was done between the first injection site and the inflection point of the neck.   Will return for repeat injection in 3 months.   A total of 200 units of Botox was prepared, 155 units of Botox was injected as documented above, any Botox not injected was wasted. The patient tolerated the procedure well, there were no complications of the above procedure.

## 2019-11-13 NOTE — Progress Notes (Signed)
Botox- 100 units x 2 vials Lot: [id number] Expiration: 04/2022 NDC: [id number]  Bacteriostatic 0.9% Sodium Chloride- 77mL total Lot: [id number] Expiration: 12/19/2019 NDC: [id number]  Dx: [id number] B/B

## 2019-11-28 ENCOUNTER — Encounter: Payer: Self-pay | Admitting: *Deleted

## 2020-01-28 ENCOUNTER — Other Ambulatory Visit: Payer: Self-pay | Admitting: Family Medicine

## 2020-01-28 DIAGNOSIS — G4700 Insomnia, unspecified: Secondary | ICD-10-CM

## 2020-01-28 NOTE — Telephone Encounter (Signed)
Is this okay to refill? 

## 2020-02-19 ENCOUNTER — Encounter: Payer: Self-pay | Admitting: Family Medicine

## 2020-02-20 ENCOUNTER — Other Ambulatory Visit: Payer: Self-pay

## 2020-02-20 ENCOUNTER — Ambulatory Visit (INDEPENDENT_AMBULATORY_CARE_PROVIDER_SITE_OTHER): Payer: 59 | Admitting: Family Medicine

## 2020-02-20 ENCOUNTER — Encounter: Payer: Self-pay | Admitting: Family Medicine

## 2020-02-20 VITALS — BP 122/86 | HR 68 | Ht 73.0 in | Wt 231.6 lb

## 2020-02-20 DIAGNOSIS — R1011 Right upper quadrant pain: Secondary | ICD-10-CM

## 2020-02-20 LAB — POCT URINALYSIS DIP (PROADVANTAGE DEVICE)
Bilirubin, UA: NEGATIVE
Blood, UA: NEGATIVE
Glucose, UA: NEGATIVE mg/dL
Ketones, POC UA: NEGATIVE mg/dL
Leukocytes, UA: NEGATIVE
Nitrite, UA: NEGATIVE
Protein Ur, POC: NEGATIVE mg/dL
Specific Gravity, Urine: 1.01
Urobilinogen, Ur: NEGATIVE
pH, UA: 7 (ref 5.0–8.0)

## 2020-02-20 MED ORDER — DEXILANT 60 MG PO CPDR: 60.0000 mg | DELAYED_RELEASE_CAPSULE | Freq: Every day | ORAL | 0 refills | Status: DC

## 2020-02-20 NOTE — Progress Notes (Signed)
Chief Complaint  Patient presents with  . Abdominal Pain    uppper right sided abdominal pain. No diarrhea, no nausea or vomiting. Does have more heartburn than normal. Symptoms have been present x 6 weeks.    6-8 weeks ago he noted discomfort in RUQ.  He notes sometimes having discomfort if hungry, when stomach is empty. Sometimes has pain after meals. Originally thought it could be muscular/bruised, related to [employer]. Pain is at RUQ, sometimes under rib cage, never epigastric.  Slight discomfort now--3/10 Pain is dull. Sometimes is tingly Pain is variable, doesn't have it every day. Sometimes it radiates around to his back, when pain is bad.  He reports he has a known thoracic disk that periodically flares. No changes to that he is aware of Denies back pain currently.  Hasn't tried OTC meds, just an occasional antacid here and there. Unsure if it helps. No change in diet. Sporadic naproxen, not regularly (2-3 tablets/week total), for his elbow. He continues to have problems with R elbow pain.  He is s/p surgery 08/2019 with Dr. Amedeo Plenty.  He admits that he went off lipitor for 2 months (as a trial for his elbow pain), back on it for about a month.  PMH, PSH, SH reviewed  Outpatient Encounter Medications as of 02/20/2020  Medication Sig Note  . atorvastatin (LIPITOR) 40 MG tablet TAKE 1 TABLET BY MOUTH DAILY.   . Multiple Vitamin (MULTIVITAMIN) tablet Take 1 tablet by mouth daily. 05/30/2019: Takes sporadically  . valACYclovir (VALTREX) 500 MG tablet TAKE 1 TABLET BY MOUTH TWICE A DAY FOR 3 DAYS AS NEEDED FOR OUTBREAKS (Patient not taking: Reported on 02/20/2020)   . zolpidem (AMBIEN) 10 MG tablet TAKE 1 TABLET BY MOUTH NIGHTLY AT BEDTIME AS NEEDED (Patient not taking: Reported on 02/20/2020)    No facility-administered encounter medications on file as of 02/20/2020.   No Known Allergies  ROS: no fever, chills, URI symptoms.  No nausea, vomiting, diarrhea, heartburn.  +pain in RUQ per  HPI, occ radiating to back.  Denies other back pain.  No numbness, tingling, weakness in extremities.  +ongoing R elbow pain with certain activities. No chest pain, shortness of breath, edema.  PHYSICAL EXAM:  BP 122/86   Pulse 68   Ht 6\' 1"  (1.854 m)   Wt 231 lb 9.6 oz (105.1 kg)   BMI 30.56 kg/m   Pleasant, well-appearing male in no distress HEENT: conjunctiva and sclera are clear, EOMI Neck: no lymphadenopathy, thyromegaly or mass Heart: regular rate and rhythm, no murmur Lungs: clear bilaterally Abdomen: soft. Tender at RUQ and below RCM. Liver percusses to the edge of RCM, not below.  Negative Murphy Back: Spine NT, no CVA tenderness Extremities: no edema Neuro: alert and oriented, normal gait Psych: normal mood, affect, hygiene and grooming   ASSESSMENT/PLAN:  RUQ pain - neg Murphy, discomfort along RCM.  Eval liver, GB. Consider thoracic radiculopathy in differential, given "tingling" and h/o bulging disk - Plan: CBC with Differential/Platelet, Hepatic function panel, Lipase, US Abdomen Limited RUQ, POCT Urinalysis DIP (Proadvantage Device)  Trial of PPI to see if this reduces pain--#10 of Dexilant 60mg  samples given. CBC, LFT, lipase [place] RUQ

## 2020-02-21 ENCOUNTER — Encounter: Payer: Self-pay | Admitting: Family Medicine

## 2020-02-21 LAB — CBC WITH DIFFERENTIAL/PLATELET
Basophils Absolute: 0 10*3/uL (ref 0.0–0.2)
Basos: 1 %
EOS (ABSOLUTE): 0.2 10*3/uL (ref 0.0–0.4)
Eos: 3 %
Hematocrit: 47.2 % (ref 37.5–51.0)
Hemoglobin: 17.1 g/dL (ref 13.0–17.7)
Immature Grans (Abs): 0 10*3/uL (ref 0.0–0.1)
Immature Granulocytes: 0 %
Lymphocytes Absolute: 1.9 10*3/uL (ref 0.7–3.1)
Lymphs: 30 %
MCH: 32.2 pg (ref 26.6–33.0)
MCHC: 36.2 g/dL — ABNORMAL HIGH (ref 31.5–35.7)
MCV: 89 fL (ref 79–97)
Monocytes Absolute: 0.4 10*3/uL (ref 0.1–0.9)
Monocytes: 6 %
Neutrophils Absolute: 3.9 10*3/uL (ref 1.4–7.0)
Neutrophils: 60 %
Platelets: 213 10*3/uL (ref 150–450)
RBC: 5.31 x10E6/uL (ref 4.14–5.80)
RDW: 12.4 % (ref 11.6–15.4)
WBC: 6.4 10*3/uL (ref 3.4–10.8)

## 2020-02-21 LAB — HEPATIC FUNCTION PANEL
ALT: 63 IU/L — ABNORMAL HIGH (ref 0–44)
AST: 58 IU/L — ABNORMAL HIGH (ref 0–40)
Albumin: 5.2 g/dL — ABNORMAL HIGH (ref 4.0–5.0)
Alkaline Phosphatase: 84 IU/L (ref 48–121)
Bilirubin Total: 0.8 mg/dL (ref 0.0–1.2)
Bilirubin, Direct: 0.18 mg/dL (ref 0.00–0.40)
Total Protein: 7.8 g/dL (ref 6.0–8.5)

## 2020-02-21 LAB — LIPASE: Lipase: 29 U/L (ref 13–78)

## 2020-02-26 ENCOUNTER — Ambulatory Visit
Admission: RE | Admit: 2020-02-26 | Discharge: 2020-02-26 | Disposition: A | Discharge status: Home / Self Care | Payer: 59 | Source: Ambulatory Visit | Attending: Family Medicine | Admitting: Family Medicine

## 2020-02-26 DIAGNOSIS — K760 Fatty (change of) liver, not elsewhere classified: Secondary | ICD-10-CM | POA: Diagnosis not present

## 2020-02-26 DIAGNOSIS — R1011 Right upper quadrant pain: Secondary | ICD-10-CM

## 2020-02-27 ENCOUNTER — Encounter: Payer: Self-pay | Admitting: Family Medicine

## 2020-02-27 DIAGNOSIS — R7989 Other specified abnormal findings of blood chemistry: Secondary | ICD-10-CM

## 2020-02-27 DIAGNOSIS — Z125 Encounter for screening for malignant neoplasm of prostate: Secondary | ICD-10-CM

## 2020-02-27 DIAGNOSIS — E7800 Pure hypercholesterolemia, unspecified: Secondary | ICD-10-CM

## 2020-02-27 DIAGNOSIS — Z5181 Encounter for therapeutic drug level monitoring: Secondary | ICD-10-CM

## 2020-02-27 DIAGNOSIS — Z0000 Encounter for general adult medical examination without abnormal findings: Secondary | ICD-10-CM

## 2020-06-14 NOTE — Patient Instructions (Addendum)
HEALTH MAINTENANCE RECOMMENDATIONS:  It is recommended that you get at least 30 minutes of aerobic exercise at least 5 days/week (for weight loss, you may need as much as 60-90 minutes). This can be any activity that gets your heart rate up. This can be divided in 10-15 minute intervals if needed, but try and build up your endurance at least once a week.  Weight bearing exercise is also recommended twice weekly.  Eat a healthy diet with lots of vegetables, fruits and fiber.  "Colorful" foods have a lot of vitamins (ie green vegetables, tomatoes, red peppers, etc).  Limit sweet tea, regular sodas and alcoholic beverages, all of which has a lot of calories and sugar.  Up to 2 alcoholic drinks daily may be beneficial for men (unless trying to lose weight, watch sugars).  Drink a lot of water.  Sunscreen of at least SPF 30 should be used on all sun-exposed parts of the skin when outside between the hours of 10 am and 4 pm (not just when at beach or pool, but even with exercise, golf, tennis, and yard work!)  Use a sunscreen that says "broad spectrum" so it covers both UVA and UVB rays, and make sure to reapply every 1-2 hours.  Remember to change the batteries in your smoke detectors when changing your clock times in the spring and fall. Carbon monoxide detectors are recommended for your home.  Use your seat belt every time you are in a car, and please drive safely and not be distracted with cell phones and texting while driving.  I recommend getting the new shingles vaccine (Shingrix). You will need to check with your insurance to see if it is covered, and if covered, schedule a nurse visit at our office when convenient.  It is a series of 2 injections, spaced 2 months apart. It should be at least 2-4 weeks from other injections (and wait 2 weeks prior to getting other vaccines afterwards).   Food Choices for Gastroesophageal Reflux Disease, Adult When you have gastroesophageal reflux disease (GERD),  the foods you eat and your eating habits are very important. Choosing the right foods can help ease the discomfort of GERD. Consider working with a diet and nutrition specialist (dietitian) to help you make healthy food choices. What general guidelines should I follow?  Eating plan  Choose healthy foods low in fat, such as fruits, vegetables, whole grains, low-fat dairy products, and lean meat, fish, and poultry.  Eat frequent, small meals instead of three large meals each day. Eat your meals slowly, in a relaxed setting. Avoid bending over or lying down until 2-3 hours after eating.  Limit high-fat foods such as fatty meats or fried foods.  Limit your intake of oils, butter, and shortening to less than 8 teaspoons each day.  Avoid the following: ? Foods that cause symptoms. These may be different for different people. Keep a food diary to keep track of foods that cause symptoms. ? Alcohol. ? Drinking large amounts of liquid with meals. ? Eating meals during the 2-3 hours before bed.  Cook foods using methods other than frying. This may include baking, grilling, or broiling. Lifestyle  Maintain a healthy weight. Ask your health care provider what weight is healthy for you. If you need to lose weight, work with your health care provider to do so safely.  Exercise for at least 30 minutes on 5 or more days each week, or as told by your health care provider.  Avoid wearing clothes  that fit tightly around your waist and chest.  Do not use any products that contain nicotine or tobacco, such as cigarettes and e-cigarettes. If you need help quitting, ask your health care provider.  Sleep with the head of your bed raised. Use a wedge under the mattress or blocks under the bed frame to raise the head of the bed. What foods are not recommended? The items listed may not be a complete list. Talk with your dietitian about what dietary choices are best for you. Grains Pastries or quick breads with  added fat. [place] toast. Vegetables Deep fried vegetables. [place] fries. Any vegetables prepared with added fat. Any vegetables that cause symptoms. For some people this may include tomatoes and tomato products, chili peppers, onions and garlic, and horseradish. Fruits Any fruits prepared with added fat. Any fruits that cause symptoms. For some people this may include citrus fruits, such as oranges, grapefruit, pineapple, and lemons. Meats and other protein foods High-fat meats, such as fatty beef or pork, hot dogs, ribs, ham, sausage, salami and bacon. Fried meat or protein, including fried fish and fried chicken. Nuts and nut butters. Dairy Whole milk and chocolate milk. Sour cream. Cream. Ice cream. Cream cheese. Milk shakes. Beverages Coffee and tea, with or without caffeine. Carbonated beverages. Sodas. Energy drinks. Fruit juice made with acidic fruits (such as orange or grapefruit). Tomato juice. Alcoholic drinks. Fats and oils Butter. Margarine. Shortening. Ghee. Sweets and desserts Chocolate and cocoa. Donuts. Seasoning and other foods Pepper. Peppermint and spearmint. Any condiments, herbs, or seasonings that cause symptoms. For some people, this may include curry, hot sauce, or vinegar-based salad dressings. Summary  When you have gastroesophageal reflux disease (GERD), food and lifestyle choices are very important to help ease the discomfort of GERD.  Eat frequent, small meals instead of three large meals each day. Eat your meals slowly, in a relaxed setting. Avoid bending over or lying down until 2-3 hours after eating.  Limit high-fat foods such as fatty meat or fried foods. This information is not intended to replace advice given to you by your health care provider. Make sure you discuss any questions you have with your health care provider. Document Revised: 12/27/2018 Document Reviewed: 09/06/2016 Elsevier Patient Education  [place].
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
## 2020-06-15 ENCOUNTER — Encounter: Payer: Self-pay | Admitting: Family Medicine

## 2020-06-15 ENCOUNTER — Other Ambulatory Visit: Payer: Self-pay

## 2020-06-15 ENCOUNTER — Ambulatory Visit (INDEPENDENT_AMBULATORY_CARE_PROVIDER_SITE_OTHER): Payer: 59 | Admitting: Family Medicine

## 2020-06-15 VITALS — BP 110/80 | HR 68 | Ht 72.5 in | Wt 233.2 lb

## 2020-06-15 DIAGNOSIS — A6000 Herpesviral infection of urogenital system, unspecified: Secondary | ICD-10-CM | POA: Diagnosis not present

## 2020-06-15 DIAGNOSIS — Z0000 Encounter for general adult medical examination without abnormal findings: Secondary | ICD-10-CM | POA: Diagnosis not present

## 2020-06-15 DIAGNOSIS — Z125 Encounter for screening for malignant neoplasm of prostate: Secondary | ICD-10-CM | POA: Diagnosis not present

## 2020-06-15 DIAGNOSIS — K760 Fatty (change of) liver, not elsewhere classified: Secondary | ICD-10-CM

## 2020-06-15 DIAGNOSIS — Z5181 Encounter for therapeutic drug level monitoring: Secondary | ICD-10-CM

## 2020-06-15 DIAGNOSIS — R7989 Other specified abnormal findings of blood chemistry: Secondary | ICD-10-CM

## 2020-06-15 DIAGNOSIS — G4700 Insomnia, unspecified: Secondary | ICD-10-CM | POA: Diagnosis not present

## 2020-06-15 DIAGNOSIS — E7800 Pure hypercholesterolemia, unspecified: Secondary | ICD-10-CM | POA: Diagnosis not present

## 2020-06-15 DIAGNOSIS — K219 Gastro-esophageal reflux disease without esophagitis: Secondary | ICD-10-CM

## 2020-06-15 DIAGNOSIS — Z6831 Body mass index (BMI) 31.0-31.9, adult: Secondary | ICD-10-CM

## 2020-06-15 MED ORDER — VALACYCLOVIR HCL 500 MG PO TABS: ORAL_TABLET | ORAL | 0 refills | Status: DC

## 2020-06-15 MED ORDER — ZOLPIDEM TARTRATE 10 MG PO TABS: 10.0000 mg | ORAL_TABLET | Freq: Every evening | ORAL | 1 refills | Status: DC | PRN

## 2020-06-15 MED FILL — ZOLPIDEM TARTRATE 10 MG TAB: 10 | 30 days supply | Qty: 30 | Fill #0

## 2020-06-15 MED FILL — VALACYCLOVIR HCL 500 MG TAB: 500 | 15 days supply | Qty: 30 | Fill #0

## 2020-06-16 ENCOUNTER — Encounter: Payer: Self-pay | Admitting: Family Medicine

## 2020-06-16 LAB — CBC WITH DIFFERENTIAL/PLATELET
Basophils Absolute: 0 10*3/uL (ref 0.0–0.2)
Basos: 1 %
EOS (ABSOLUTE): 0.5 10*3/uL — ABNORMAL HIGH (ref 0.0–0.4)
Eos: 6 %
Hematocrit: 47.9 % (ref 37.5–51.0)
Hemoglobin: 16.3 g/dL (ref 13.0–17.7)
Immature Grans (Abs): 0 10*3/uL (ref 0.0–0.1)
Immature Granulocytes: 1 %
Lymphocytes Absolute: 2 10*3/uL (ref 0.7–3.1)
Lymphs: 28 %
MCH: 31.6 pg (ref 26.6–33.0)
MCHC: 34 g/dL (ref 31.5–35.7)
MCV: 93 fL (ref 79–97)
Monocytes Absolute: 0.5 10*3/uL (ref 0.1–0.9)
Monocytes: 6 %
Neutrophils Absolute: 4.3 10*3/uL (ref 1.4–7.0)
Neutrophils: 58 %
Platelets: 213 10*3/uL (ref 150–450)
RBC: 5.16 x10E6/uL (ref 4.14–5.80)
RDW: 13.1 % (ref 11.6–15.4)
WBC: 7.4 10*3/uL (ref 3.4–10.8)

## 2020-06-16 LAB — LIPID PANEL
Chol/HDL Ratio: 4.4 ratio (ref 0.0–5.0)
Cholesterol, Total: 240 mg/dL — ABNORMAL HIGH (ref 100–199)
HDL: 55 mg/dL (ref >39)
LDL Chol Calc (NIH): 151 mg/dL — ABNORMAL HIGH (ref 0–99)
Triglycerides: 186 mg/dL — ABNORMAL HIGH (ref 0–149)
VLDL Cholesterol Cal: 34 mg/dL (ref 5–40)

## 2020-06-16 LAB — COMPREHENSIVE METABOLIC PANEL
ALT: 37 IU/L (ref 0–44)
AST: 28 IU/L (ref 0–40)
Albumin/Globulin Ratio: 1.9 (ref 1.2–2.2)
Albumin: 5 g/dL (ref 4.0–5.0)
Alkaline Phosphatase: 85 IU/L (ref 44–121)
BUN/Creatinine Ratio: 14 (ref 9–20)
BUN: 16 mg/dL (ref 6–24)
Bilirubin Total: 1 mg/dL (ref 0.0–1.2)
CO2: 24 mmol/L (ref 20–29)
Calcium: 9.9 mg/dL (ref 8.7–10.2)
Chloride: 99 mmol/L (ref 96–106)
Creatinine, Ser: 1.13 mg/dL (ref 0.76–1.27)
GFR calc Af Amer: 87 mL/min/{1.73_m2} (ref >59)
GFR calc non Af Amer: 75 mL/min/{1.73_m2} (ref >59)
Globulin, Total: 2.6 g/dL (ref 1.5–4.5)
Glucose: 102 mg/dL — ABNORMAL HIGH (ref 65–99)
Potassium: 4.6 mmol/L (ref 3.5–5.2)
Sodium: 139 mmol/L (ref 134–144)
Total Protein: 7.6 g/dL (ref 6.0–8.5)

## 2020-06-16 LAB — TSH: TSH: 1.02 u[IU]/mL (ref 0.450–4.500)

## 2020-06-16 LAB — PSA: Prostate Specific Ag, Serum: 1.2 ng/mL (ref 0.0–4.0)

## 2020-06-18 ENCOUNTER — Other Ambulatory Visit: Payer: Self-pay | Admitting: *Deleted

## 2020-06-18 DIAGNOSIS — R7301 Impaired fasting glucose: Secondary | ICD-10-CM

## 2020-06-18 DIAGNOSIS — E782 Mixed hyperlipidemia: Secondary | ICD-10-CM

## 2020-06-18 MED FILL — ATORVASTATIN 40 MG TABLET: 40 | 90 days supply | Qty: 90 | Fill #1

## 2020-07-13 DIAGNOSIS — Z86018 Personal history of other benign neoplasm: Secondary | ICD-10-CM | POA: Diagnosis not present

## 2020-07-13 DIAGNOSIS — L570 Actinic keratosis: Secondary | ICD-10-CM | POA: Diagnosis not present

## 2020-07-13 DIAGNOSIS — L578 Other skin changes due to chronic exposure to nonionizing radiation: Secondary | ICD-10-CM | POA: Diagnosis not present

## 2020-07-13 DIAGNOSIS — D2272 Melanocytic nevi of left lower limb, including hip: Secondary | ICD-10-CM | POA: Diagnosis not present

## 2020-07-13 DIAGNOSIS — L814 Other melanin hyperpigmentation: Secondary | ICD-10-CM | POA: Diagnosis not present

## 2020-07-13 DIAGNOSIS — D225 Melanocytic nevi of trunk: Secondary | ICD-10-CM | POA: Diagnosis not present

## 2020-08-27 DIAGNOSIS — H524 Presbyopia: Secondary | ICD-10-CM | POA: Diagnosis not present

## 2020-10-14 ENCOUNTER — Other Ambulatory Visit: Payer: Self-pay | Admitting: Family Medicine

## 2020-10-14 DIAGNOSIS — E7800 Pure hypercholesterolemia, unspecified: Secondary | ICD-10-CM

## 2020-10-14 MED FILL — ATORVASTATIN 40 MG TABLET: 40 | 90 days supply | Qty: 90 | Fill #0

## 2020-11-23 ENCOUNTER — Other Ambulatory Visit: Payer: Self-pay

## 2020-11-23 ENCOUNTER — Other Ambulatory Visit: Payer: 59

## 2020-11-23 DIAGNOSIS — R7301 Impaired fasting glucose: Secondary | ICD-10-CM | POA: Diagnosis not present

## 2020-11-23 DIAGNOSIS — E782 Mixed hyperlipidemia: Secondary | ICD-10-CM | POA: Diagnosis not present

## 2020-11-24 LAB — LIPID PANEL
Chol/HDL Ratio: 4.7 ratio (ref 0.0–5.0)
Cholesterol, Total: 205 mg/dL — ABNORMAL HIGH (ref 100–199)
HDL: 44 mg/dL (ref >39)
LDL Chol Calc (NIH): 140 mg/dL — ABNORMAL HIGH (ref 0–99)
Triglycerides: 119 mg/dL (ref 0–149)
VLDL Cholesterol Cal: 21 mg/dL (ref 5–40)

## 2020-11-24 LAB — GLUCOSE, RANDOM: Glucose: 88 mg/dL (ref 65–99)

## 2020-12-20 NOTE — Progress Notes (Signed)
Start time: 12:19 End time: 12:39  Virtual Visit via Video Note  I connected with Larry Cruz on 12/21/2020 by a video enabled telemedicine application and verified that I am speaking with the correct person using two identifiers.  Location: Patient: home Provider: office   I discussed the limitations of evaluation and management by telemedicine and the availability of in person appointments. The patient expressed understanding and agreed to proceed.  History of Present Illness:  Chief Complaint  Patient presents with  . Follow-up    VIRTUAL follow up on cholesterol.    Patient presents to follow up on his labs from last month.  He reports compliance with taking 40mg  of atorvastatin daily.  At his physical in 05/2020, his cholesterol was significantly higher--LDL went up from 100 to 151, and TG were up to 186.  At the time of his visit, he had reported decrease in red meat and cheese, so was surprised to see it higher.  We discussed a trial of diet before changing medication, and he was reminded of low cholesterol diet.  He had his lipids repeated last month.  The TG improved, but the LDL was only a little lower, still borderline (and HDL dropped, so chol/HDL ratio wasn't great).  He presents today to discuss diet and lipid-lowering medications.  Component Ref Range & Units 3 wk ago  (11/23/20) 6 mo ago  (06/15/20) 1 yr ago  (05/30/19) 2 yr ago  (11/21/18) 2 yr ago  (09/05/18) 2 yr ago  (05/16/18) 3 yr ago  (05/10/17)  Cholesterol, Total 100 - 199 mg/dL 205High  240High  180  216High  221High  253High    Triglycerides 0 - 149 mg/dL 119  186High  152High  153High  180High  146  139 R   HDL >39 mg/dL 44  55  54  50  44  61  47 R   VLDL Cholesterol Cal 5 - 40 mg/dL 21  34  26  31  36  29    LDL Chol Calc (NIH) 0 - 99 mg/dL 140High  151High  100High       Chol/HDL Ratio 0.0 - 5.0 ratio 4.7  4.4 CM  3.3 CM  4.3 CM  5.0 CM  4.1 CM  4.3 R    Impaired fasting  glucose--sugar was 102 in September.  Repeat in March was normal, at 68.  "my diet is never going to be fantastic"--due to family, kids Some restless legs at night. Denies myalgias.    Observations/Objective:  Ht 6' 0.5" (1.842 m)   BMI 31.19 kg/m   Well-appearing, pleasant male, in good spirits, in no distress He is alert, oriented. Cranial nerves are grossly intact. Normal mood, affect, grooming   Assessment and Plan:  Mixed hyperlipidemia - borderline control on 40mg  atorvastatin. Increase to 80mg . If doesn't tolerate (or not at goal), can try change to crestor. Cont low cholesterol diet - Plan: atorvastatin (LIPITOR) 80 MG tablet, Lipid panel, Hepatic function panel  Medication monitoring encounter - Plan: Hepatic function panel    Follow Up Instructions:    I discussed the assessment and treatment plan with the patient. The patient was provided an opportunity to ask questions and all were answered. The patient agreed with the plan and demonstrated an understanding of the instructions.   The patient was advised to call back or seek an in-person evaluation if the symptoms worsen or if the condition fails to improve as anticipated.  I spent 22 minutes dedicated  to the care of this patient, including pre-visit review of records, face to face time, post-visit ordering of testing and documentation.    Vikki Ports, MD

## 2020-12-21 ENCOUNTER — Encounter: Payer: Self-pay | Admitting: Family Medicine

## 2020-12-21 ENCOUNTER — Other Ambulatory Visit: Payer: Self-pay

## 2020-12-21 ENCOUNTER — Telehealth (INDEPENDENT_AMBULATORY_CARE_PROVIDER_SITE_OTHER): Payer: 59 | Admitting: Family Medicine

## 2020-12-21 ENCOUNTER — Other Ambulatory Visit (HOSPITAL_COMMUNITY): Payer: Self-pay

## 2020-12-21 VITALS — Ht 72.5 in

## 2020-12-21 DIAGNOSIS — Z5181 Encounter for therapeutic drug level monitoring: Secondary | ICD-10-CM | POA: Diagnosis not present

## 2020-12-21 DIAGNOSIS — E782 Mixed hyperlipidemia: Secondary | ICD-10-CM

## 2020-12-21 MED ORDER — ATORVASTATIN CALCIUM 80 MG PO TABS
80.0000 mg | ORAL_TABLET | Freq: Every day | ORAL | 0 refills | Status: DC
  Filled 2020-12-21 – 2021-01-08 (×2): qty 90, 90d supply, fill #0

## 2020-12-21 NOTE — Patient Instructions (Signed)
We are increasing your atorvastatin dose from 40 to 80mg . If you develop any side effects at the higher dose, try taking CoEnzyme Q10 daily, along with the statin. If your symptoms persist, let us know, and we can try changing to rosuvastatin (Crestor).  Do your best to limit the cholesterol in your diet.  We will recheck your labs on this higher dose in 2-3 months.

## 2020-12-30 ENCOUNTER — Other Ambulatory Visit (HOSPITAL_COMMUNITY): Payer: Self-pay

## 2021-01-08 ENCOUNTER — Other Ambulatory Visit (HOSPITAL_COMMUNITY): Payer: Self-pay

## 2021-01-08 ENCOUNTER — Ambulatory Visit (INDEPENDENT_AMBULATORY_CARE_PROVIDER_SITE_OTHER): Payer: 59 | Admitting: Family Medicine

## 2021-01-08 ENCOUNTER — Other Ambulatory Visit: Payer: Self-pay

## 2021-01-08 ENCOUNTER — Encounter: Payer: Self-pay | Admitting: Family Medicine

## 2021-01-08 VITALS — BP 128/72 | HR 75 | Temp 96.5°F | Wt 233.4 lb

## 2021-01-08 DIAGNOSIS — H60331 Swimmer's ear, right ear: Secondary | ICD-10-CM

## 2021-01-08 MED ORDER — CORTISPORIN-TC 3.3-3-10-0.5 MG/ML OT SUSP
3.0000 [drp] | Freq: Three times a day (TID) | OTIC | 0 refills | Status: DC
  Filled 2021-01-08: qty 10, 22d supply, fill #0

## 2021-01-08 MED ORDER — NEOMYCIN-POLYMYXIN-HC 3.5-10000-1 OT SOLN
3.0000 [drp] | Freq: Three times a day (TID) | OTIC | 0 refills | Status: DC
  Filled 2021-01-08: qty 10, 22d supply, fill #0

## 2021-01-08 NOTE — Addendum Note (Signed)
Addended by: Denita Lung on: 01/08/2021 03:34 PM   Modules accepted: Orders

## 2021-01-08 NOTE — Progress Notes (Signed)
   Subjective:    Patient ID: Larry Cruz, male    DOB: Mar 28, 1971, 50 y.o.   MRN: [id number]  HPI He is here for evaluation of right ear pain.  He has had difficulty with this in the past as well as a cerumen impaction.  No fever, chills, sore throat.   Review of Systems     Objective:   Physical Exam Alert and in no distress.  Slight pain on manipulation of the right ear.  The canal is very narrow and I was unable to see the tympanic membrane.  Left canal is also narrow but I was able to see a normal tympanic membrane.       Assessment & Plan:  Acute swimmer's ear of right side - Plan: neomycin-colistin-hydrocortisone-thonzonium (CORTISPORIN-TC) 3.11-19-08-0.5 MG/ML OTIC suspension I explained this to him.  He has had difficulty with this in the past and knows how to take care of it and also discussed irrigating the ear rather than mechanically trying to remove any wax.

## 2021-03-31 DIAGNOSIS — Z6829 Body mass index (BMI) 29.0-29.9, adult: Secondary | ICD-10-CM | POA: Diagnosis not present

## 2021-03-31 DIAGNOSIS — H60501 Unspecified acute noninfective otitis externa, right ear: Secondary | ICD-10-CM | POA: Diagnosis not present

## 2021-04-01 ENCOUNTER — Other Ambulatory Visit (HOSPITAL_COMMUNITY): Payer: Self-pay

## 2021-04-19 ENCOUNTER — Other Ambulatory Visit (HOSPITAL_COMMUNITY): Payer: Self-pay

## 2021-04-19 ENCOUNTER — Encounter: Payer: Self-pay | Admitting: Family Medicine

## 2021-04-19 DIAGNOSIS — G4700 Insomnia, unspecified: Secondary | ICD-10-CM

## 2021-04-19 MED ORDER — ZOLPIDEM TARTRATE 10 MG PO TABS
10.0000 mg | ORAL_TABLET | Freq: Every evening | ORAL | 0 refills | Status: DC | PRN
  Filled 2021-04-19: qty 30, 30d supply, fill #0

## 2021-04-22 ENCOUNTER — Other Ambulatory Visit: Payer: Self-pay | Admitting: Family Medicine

## 2021-04-22 ENCOUNTER — Other Ambulatory Visit (HOSPITAL_COMMUNITY): Payer: Self-pay

## 2021-04-22 ENCOUNTER — Other Ambulatory Visit: Payer: Self-pay

## 2021-04-22 ENCOUNTER — Other Ambulatory Visit: Payer: 59

## 2021-04-22 DIAGNOSIS — E782 Mixed hyperlipidemia: Secondary | ICD-10-CM

## 2021-04-22 DIAGNOSIS — Z5181 Encounter for therapeutic drug level monitoring: Secondary | ICD-10-CM | POA: Diagnosis not present

## 2021-04-23 ENCOUNTER — Other Ambulatory Visit (HOSPITAL_COMMUNITY): Payer: Self-pay

## 2021-04-23 LAB — LIPID PANEL
Chol/HDL Ratio: 3.9 ratio (ref 0.0–5.0)
Cholesterol, Total: 209 mg/dL — ABNORMAL HIGH (ref 100–199)
HDL: 53 mg/dL (ref >39)
LDL Chol Calc (NIH): 128 mg/dL — ABNORMAL HIGH (ref 0–99)
Triglycerides: 156 mg/dL — ABNORMAL HIGH (ref 0–149)
VLDL Cholesterol Cal: 28 mg/dL (ref 5–40)

## 2021-04-23 LAB — HEPATIC FUNCTION PANEL
ALT: 37 IU/L (ref 0–44)
AST: 32 IU/L (ref 0–40)
Albumin: 4.7 g/dL (ref 3.8–4.9)
Alkaline Phosphatase: 94 IU/L (ref 44–121)
Bilirubin Total: 0.7 mg/dL (ref 0.0–1.2)
Bilirubin, Direct: 0.18 mg/dL (ref 0.00–0.40)
Total Protein: 7 g/dL (ref 6.0–8.5)

## 2021-04-23 MED ORDER — ATORVASTATIN CALCIUM 80 MG PO TABS
80.0000 mg | ORAL_TABLET | Freq: Every day | ORAL | 0 refills | Status: DC
  Filled 2021-04-23: qty 90, 90d supply, fill #0

## 2021-06-27 NOTE — Patient Instructions (Signed)
  HEALTH MAINTENANCE RECOMMENDATIONS:  It is recommended that you get at least 30 minutes of aerobic exercise at least 5 days/week (for weight loss, you may need as much as 60-90 minutes). This can be any activity that gets your heart rate up. This can be divided in 10-15 minute intervals if needed, but try and build up your endurance at least once a week.  Weight bearing exercise is also recommended twice weekly.  Eat a healthy diet with lots of vegetables, fruits and fiber.  "Colorful" foods have a lot of vitamins (ie green vegetables, tomatoes, red peppers, etc).  Limit sweet tea, regular sodas and alcoholic beverages, all of which has a lot of calories and sugar.  Up to 2 alcoholic drinks daily may be beneficial for men (unless trying to lose weight, watch sugars).  Drink a lot of water.  Sunscreen of at least SPF 30 should be used on all sun-exposed parts of the skin when outside between the hours of 10 am and 4 pm (not just when at beach or pool, but even with exercise, golf, tennis, and yard work!)  Use a sunscreen that says "broad spectrum" so it covers both UVA and UVB rays, and make sure to reapply every 1-2 hours.  Remember to change the batteries in your smoke detectors when changing your clock times in the spring and fall.  Carbon monoxide detectors are recommended for your home.  Use your seat belt every time you are in a car, and please drive safely and not be distracted with cell phones and texting while driving.   I recommend getting the new shingles vaccine (Shingrix). You may want to check with your insurance to verify what your out of pocket cost may be (usually covered as preventative, but better to verify to avoid any surprises, as this vaccine is expensive), and then schedule a nurse visit at our office when convenient (based on the possible side effects as discussed).   This is a series of 2 injections, spaced 2 months apart.  It doesn't have to be exactly 2 months apart (but  can't be sooner), if that isn't feasible for your schedule, but try and get them close to 2 months (and definitely within 6 months of each other, or else the efficacy of the vaccine drops off). This should be separated from other vaccines by at least 2 weeks.

## 2021-06-27 NOTE — Progress Notes (Signed)
Chief Complaint  Patient presents with   Annual Exam    Fasting annual exam. No new concerns. Still considering covid booster.     Larry Cruz is a 50 y.o. male who presents for a complete physical.    Hyperlipidemia follow-up:  He reports compliance with atorvastatin 46m daily, and is trying to follow a lowfat, low cholesterol diet.  He denies any side effects. Of the lipitor. Dose of lipitor was increased from 40 to 864min April 2022, and recheck in June showed improved LDL, borderline TG. Fatty liver changes were noted on abdominal ultrasound in June 2021, with h/o elevated LFT's (which have resolved.)  His current diet includes red meat to 2-3x/week (had been 1x/week on last check), +cheese (sandwich, snacks), 2-3 eggs/week, occasional ice cream; Italian/vinaigrette dressings, no mayo. Kids activities are ramped up, and meals are on-the-go about 4 nights/week. Lab Results  Component Value Date   CHOL 209 (H) 04/22/2021   HDL 53 04/22/2021   LDLCALC 128 (H) 04/22/2021   TRIG 156 (H) 04/22/2021   CHOLHDL 3.9 04/22/2021   Lab Results  Component Value Date   ALT 37 04/22/2021   AST 32 04/22/2021   ALKPHOS 94 04/22/2021   BILITOT 0.7 04/22/2021    GERD:  He reports having some reflux symptoms, just intermittently now. Uses an OTC Nexium prn (every few weeks).  Unable to identify triggers, has some spicy foods.  He still occasionally has some RUQ pain, often if he skips a meal.  It resolves on its own, unsure if eating resolves it.  Insomnia--intermittent. There may be weeks he may need Ambien 1-2 times, occasionally up to 4, but some weeks without.  Averages about 1-2/week.  Sometimes only needs 1/2 tablet. Last filled #30 on 04/19/2021; prior fill was 05/2020.    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  Last refilled Valtrex #30 05/2020. Doesn't need a refill. As long as he gets adequate sleep, he doesn't have outbreaks.  H/o Right lateral epicondylitis, s/p  percutaneous release by Dr. GrAmedeo Plentybut has persistent discomfort, and is still unable to play tennis. He can't sleep with his arm bent, or the RUE is painful. He has gotten better about sleeping with his arm straight (doesn't need to sleep with a brace).  Hasn't been back for f/u.    He has had chronic headaches since meningitis in 2019. Topamax wasn't helpful, ultimately resolved with Botox. No longer requires any treatments (had total of about 4-5 treatments).  He still has some mild infrequent headaches, usually related to dehydration or not getting enough sleep.    Immunization History  Administered Date(s) Administered   Influenza Split 07/03/2012, 07/03/2014, 06/20/2019   Influenza-Unspecified 06/19/2018, 06/14/2021   Tdap 10/22/2012   Has had 3 COVID vaccines, last booster was 08/2020. Gets flu shots yearly through work ([employer]) Last colonoscopy: 08/2019 with Dr. StFuller Plan Had 7 and 1028molyps, which were adenomatous, along with internal hemorrhoids. Repeat due in 08/2022.  Last PSA:  Lab Results  Component Value Date   PSA1 1.2 06/15/2020  Dentist: twice yearly   Ophtho: yearly Exercise: walks 2-3 days/week, 20-30 minutes. No other exercise currently. Recent hiking in [place]. Vitamin D-OH normal in 04/2015 (40)  PMH, PSH, SH and FH were reviewed and updated  Outpatient Encounter Medications as of 06/28/2021  Medication Sig Note   atorvastatin (LIPITOR) 80 MG tablet Take 1 tablet (80 mg total) by mouth daily.    Esomeprazole Magnesium (NEXIUM 24HR PO)  Take 1 capsule by mouth daily as needed. 06/28/2021: As needed   Multiple Vitamin (MULTIVITAMIN) tablet Take 1 tablet by mouth daily. 06/15/2020: Takes most days   valACYclovir (VALTREX) 500 MG tablet TAKE 1 TABLET BY MOUTH TWICE A DAY FOR 3 DAYS AS NEEDED FOR OUTBREAKS (Patient not taking: No sig reported)    zolpidem (AMBIEN) 10 MG tablet Take 1 tablet (10 mg total) by mouth at bedtime as needed for sleep. (Patient not taking:  Reported on 06/28/2021) 06/28/2021: About once a week   [DISCONTINUED] neomycin-polymyxin-hydrocortisone (CORTISPORIN) OTIC solution Place 3 drops into the right ear 3 (three) times daily.    No facility-administered encounter medications on file as of 06/28/2021.   No Known Allergies   ROS: The patient denies anorexia, fever, vision loss, decreased hearing, ear pain, hoarseness, chest pain, dizziness, syncope, dyspnea on exertion, cough, swelling, nausea, vomiting, diarrhea, melena, hematochezia, hematuria, incontinence, erectile dysfunction, nocturia, weakened urine stream, dysuria, genital lesions, numbness, tingling, weakness, tremor, suspicious skin lesions, depression, anxiety, abnormal bleeding/bruising, or enlarged lymph nodes.   Herpes flares are infrequent. Persistent R elbow pain per HPI. Rare mild headaches per HPI.  Denies vertigo, dysequilibrium or other neuro symptoms. Insomnia--per HPI, infrequent Goes to the dermatologist yearly.  No current skin concerns. Heartburn per HPI, less frequent; intermittent RUQ pain per HPI.    PHYSICAL EXAM:  BP 130/86   Pulse 64   Ht _0  (1.854 m)   Wt 230 lb 9.6 oz (104.6 kg)   BMI 30.42 kg/m   120/86 on repeat by MD  Wt Readings from Last 3 Encounters:  06/28/21 230 lb 9.6 oz (104.6 kg)  01/08/21 233 lb 6.4 oz (105.9 kg)  06/15/20 233 lb 3.2 oz (105.8 kg)    General Appearance  Alert, cooperative, no distress, appears stated age    Head:   Normocephalic, without obvious abnormality, atraumatic    Eyes:   PERRL, conjunctiva/corneas clear, EOM's intact, fundi benign    Ears:   Normal TMs and EACs bilaterally  Nose:   Not examined, wearing mask due to COVID-19 pandemic   Throat:   Not examined, wearing mask due to COVID-19 pandemic   Neck:   Supple, no lymphadenopathy; thyroid: no enlargement/ tenderness/nodules; no carotid bruit or JVD    Back:   Spine nontender, no curvature, ROM normal, no CVA tenderness    Lungs:   Clear  to auscultation bilaterally without wheezes, rales or ronchi; respirations unlabored   Chest Wall:   No tenderness or deformity    Heart:   Regular rate and rhythm, S1 and S2 normal, no murmur, rub or gallop    Breast Exam:  No chest wall tenderness, masses or gynecomastia   Abdomen:   Soft, non-tender, nondistended, normoactive bowel sounds,  no masses, no hepatosplenomegaly. +central abdominal obesity  Genitalia:   Normal male external genitalia without lesions. Testicles without masses.  No inguinal hernias.    Rectal:   Normal sphincter tone, no masses or tenderness; guaiac negative stool. Prostate smooth, no nodules, not enlarged.    Extremities:   No clubbing, cyanosis or edema  Pulses:   2+ and symmetric all extremities    Skin:   Skin color, texture, turgor normal, no lesions.  Lymph nodes:   Cervical, supraclavicular, and inguinal nodes normal    Neurologic:   Alert and oriented. Normal strength, sensation and gait; reflexes 2+ and symmetric throughout                Psych:  Normal mood, affect, hygiene and grooming    ASSESSMENT/PLAN:  Annual physical exam - Plan: CBC with Differential/Platelet, Comprehensive metabolic panel, PSA  Screening for prostate cancer - Plan: PSA  BMI 30.0-30.9,adult - counseled re: diet, exercise, healthy quick options  Insomnia, unspecified type - infrequent; continue ambien prn  Mixed hyperlipidemia - diet not quite as good as last visit. Counseled re: lowfat, low cholesterol diet, and healthy options for fast food, meal prep, snacks  Gastroesophageal reflux disease without esophagitis - hasn't been able to identify trigger. Discussed possible ones, encouraged weight loss. Cont prn use of meds  RUQ pain - mild and sporadic, with normal RUQ [place] in past (other than fatty liver, no GB dz). Encouraged lowfat diet, f/u if persists/worsens  Cbc, c-met, PSA (too soon to recheck lipids)  Discussed PSA screening (risks/benefits), recommended at least 30  minutes of aerobic activity at least 5 days/week, weight-bearing exercise 2x/week; proper sunscreen use reviewed; healthy diet and alcohol recommendations (less than or equal to 2 drinks/day, even less if elevated LFT's pesist) reviewed; regular seatbelt use; changing batteries in smoke detectors. Immunization recommendations discussed--continue yearly flu shots through work. Counseled re: risks/SE of Shingrix, and recommended he return for [place] when convenient. Discussed COVID boosters, encouraged him to set up NV to get bivalent booster.  Colonoscopy recommendations reviewed, UTD; due again 08/2022.  Lipids (lab only) in 11/2021.  CPE 1 year, sooner prn based on lab results

## 2021-06-28 ENCOUNTER — Encounter: Payer: Self-pay | Admitting: Family Medicine

## 2021-06-28 ENCOUNTER — Other Ambulatory Visit: Payer: Self-pay

## 2021-06-28 ENCOUNTER — Ambulatory Visit (INDEPENDENT_AMBULATORY_CARE_PROVIDER_SITE_OTHER): Payer: 59 | Admitting: Family Medicine

## 2021-06-28 VITALS — BP 130/86 | HR 64 | Ht 73.0 in | Wt 230.6 lb

## 2021-06-28 DIAGNOSIS — G4700 Insomnia, unspecified: Secondary | ICD-10-CM | POA: Diagnosis not present

## 2021-06-28 DIAGNOSIS — Z0000 Encounter for general adult medical examination without abnormal findings: Secondary | ICD-10-CM | POA: Diagnosis not present

## 2021-06-28 DIAGNOSIS — K219 Gastro-esophageal reflux disease without esophagitis: Secondary | ICD-10-CM

## 2021-06-28 DIAGNOSIS — E782 Mixed hyperlipidemia: Secondary | ICD-10-CM

## 2021-06-28 DIAGNOSIS — Z23 Encounter for immunization: Secondary | ICD-10-CM

## 2021-06-28 DIAGNOSIS — R1011 Right upper quadrant pain: Secondary | ICD-10-CM

## 2021-06-28 DIAGNOSIS — Z6830 Body mass index (BMI) 30.0-30.9, adult: Secondary | ICD-10-CM | POA: Diagnosis not present

## 2021-06-28 DIAGNOSIS — Z125 Encounter for screening for malignant neoplasm of prostate: Secondary | ICD-10-CM

## 2021-06-29 ENCOUNTER — Telehealth: Payer: 59 | Admitting: Family Medicine

## 2021-06-29 ENCOUNTER — Encounter: Payer: Self-pay | Admitting: Family Medicine

## 2021-06-29 VITALS — Temp 101.3°F | Wt 230.0 lb

## 2021-06-29 DIAGNOSIS — T50B95A Adverse effect of other viral vaccines, initial encounter: Secondary | ICD-10-CM | POA: Diagnosis not present

## 2021-06-29 LAB — CBC WITH DIFFERENTIAL/PLATELET
Basophils Absolute: 0 10*3/uL (ref 0.0–0.2)
Basos: 1 %
EOS (ABSOLUTE): 0.3 10*3/uL (ref 0.0–0.4)
Eos: 5 %
Hematocrit: 46.7 % (ref 37.5–51.0)
Hemoglobin: 16 g/dL (ref 13.0–17.7)
Immature Grans (Abs): 0 10*3/uL (ref 0.0–0.1)
Immature Granulocytes: 0 %
Lymphocytes Absolute: 2.1 10*3/uL (ref 0.7–3.1)
Lymphs: 31 %
MCH: 31.9 pg (ref 26.6–33.0)
MCHC: 34.3 g/dL (ref 31.5–35.7)
MCV: 93 fL (ref 79–97)
Monocytes Absolute: 0.5 10*3/uL (ref 0.1–0.9)
Monocytes: 7 %
Neutrophils Absolute: 3.6 10*3/uL (ref 1.4–7.0)
Neutrophils: 56 %
Platelets: 220 10*3/uL (ref 150–450)
RBC: 5.02 x10E6/uL (ref 4.14–5.80)
RDW: 13.1 % (ref 11.6–15.4)
WBC: 6.5 10*3/uL (ref 3.4–10.8)

## 2021-06-29 LAB — COMPREHENSIVE METABOLIC PANEL
ALT: 31 IU/L (ref 0–44)
AST: 32 IU/L (ref 0–40)
Albumin/Globulin Ratio: 1.9 (ref 1.2–2.2)
Albumin: 4.9 g/dL (ref 3.8–4.9)
Alkaline Phosphatase: 109 IU/L (ref 44–121)
BUN/Creatinine Ratio: 12 (ref 9–20)
BUN: 13 mg/dL (ref 6–24)
Bilirubin Total: 1 mg/dL (ref 0.0–1.2)
CO2: 22 mmol/L (ref 20–29)
Calcium: 9.6 mg/dL (ref 8.7–10.2)
Chloride: 101 mmol/L (ref 96–106)
Creatinine, Ser: 1.11 mg/dL (ref 0.76–1.27)
Globulin, Total: 2.6 g/dL (ref 1.5–4.5)
Glucose: 102 mg/dL — ABNORMAL HIGH (ref 70–99)
Potassium: 4.3 mmol/L (ref 3.5–5.2)
Sodium: 141 mmol/L (ref 134–144)
Total Protein: 7.5 g/dL (ref 6.0–8.5)
eGFR: 80 mL/min/{1.73_m2} (ref >59)

## 2021-06-29 LAB — PSA: Prostate Specific Ag, Serum: 1.2 ng/mL (ref 0.0–4.0)

## 2021-06-29 NOTE — Progress Notes (Signed)
   Subjective:    Patient ID: Larry Cruz, male    DOB: Sep 11, 1971, 50 y.o.   MRN: [id number]  HPI Documentation for virtual audio and video telecommunications through [place] encounter: The patient was located at home. 2 patient identifiers used.  The provider was located in the office. The patient did consent to this visit and is aware of possible charges through their insurance for this visit. The other persons participating in this telemedicine service were none. Time spent on call was 5 minutes and in review of previous records >15 minutes total for counseling and coordination of care. This virtual service is not related to other E/M service within previous 7 days.  He states that he was seen yesterday here for complete exam and given fluids and apparently the Moderna vaccine.  In the past when he got the vaccine he had very minimal difficulties.  He states that last night around 7 he developed fever, chills, myalgias, headache and what he describes as eyestrain.  He has taken 2 ibuprofen twice with minimal benefit.  He has a previous history of meningitis and states that the visual and headache symptoms are similar to when he had meningitis.  Review of Systems     Objective:   Physical Exam Alert and in no distress but appears fatigued.       Assessment & Plan:  Adverse reaction to COVID-19 vaccine Recommend alternating Tylenol and Advil.  2 Tylenol 4 times per day alternating with 3 ibuprofen 4 times per day.  Discussed worsening of the symptoms especially the headache and visual changes and the possibility of being seen in the emergency room for further evaluation.  Hopefully we can avoid this.  He is comfortable with that.

## 2021-06-30 NOTE — Addendum Note (Signed)
Addended by: Carolee Rota F on: 06/30/2021 08:09 AM   Modules accepted: Orders

## 2021-07-19 DIAGNOSIS — Z23 Encounter for immunization: Secondary | ICD-10-CM | POA: Diagnosis not present

## 2021-07-19 DIAGNOSIS — L578 Other skin changes due to chronic exposure to nonionizing radiation: Secondary | ICD-10-CM | POA: Diagnosis not present

## 2021-07-19 DIAGNOSIS — D225 Melanocytic nevi of trunk: Secondary | ICD-10-CM | POA: Diagnosis not present

## 2021-07-19 DIAGNOSIS — L570 Actinic keratosis: Secondary | ICD-10-CM | POA: Diagnosis not present

## 2021-07-19 DIAGNOSIS — Z86018 Personal history of other benign neoplasm: Secondary | ICD-10-CM | POA: Diagnosis not present

## 2021-07-19 DIAGNOSIS — L814 Other melanin hyperpigmentation: Secondary | ICD-10-CM | POA: Diagnosis not present

## 2021-07-19 DIAGNOSIS — D2272 Melanocytic nevi of left lower limb, including hip: Secondary | ICD-10-CM | POA: Diagnosis not present

## 2021-07-27 ENCOUNTER — Other Ambulatory Visit (HOSPITAL_COMMUNITY): Payer: Self-pay

## 2021-07-27 DIAGNOSIS — M25511 Pain in right shoulder: Secondary | ICD-10-CM | POA: Diagnosis not present

## 2021-07-27 MED ORDER — MELOXICAM 7.5 MG PO TABS
7.5000 mg | ORAL_TABLET | Freq: Two times a day (BID) | ORAL | 0 refills | Status: DC | PRN
  Filled 2021-07-27: qty 60, 30d supply, fill #0

## 2021-08-04 ENCOUNTER — Other Ambulatory Visit: Payer: Self-pay

## 2021-08-04 ENCOUNTER — Ambulatory Visit: Payer: 59 | Attending: Family Medicine | Admitting: Physical Therapy

## 2021-08-04 ENCOUNTER — Encounter: Payer: Self-pay | Admitting: Physical Therapy

## 2021-08-04 DIAGNOSIS — G8929 Other chronic pain: Secondary | ICD-10-CM | POA: Insufficient documentation

## 2021-08-04 DIAGNOSIS — M6281 Muscle weakness (generalized): Secondary | ICD-10-CM | POA: Insufficient documentation

## 2021-08-04 DIAGNOSIS — M25511 Pain in right shoulder: Secondary | ICD-10-CM | POA: Diagnosis not present

## 2021-08-04 DIAGNOSIS — M62838 Other muscle spasm: Secondary | ICD-10-CM | POA: Insufficient documentation

## 2021-08-04 NOTE — Therapy (Signed)
[facility] [place], [place], [address] Phone: [phone]   Fax:  [phone]  Physical Therapy Evaluation  Patient Details  Name: Larry Cruz MRN: [id number] Date of Birth: 11-24-1970 Referring Provider (PT): Ophelia Charter MD   Encounter Date: 08/04/2021   PT End of Session - 08/04/21 0848     Visit Number 1    Number of Visits 12    Date for PT Re-Evaluation 10/27/21    Authorization Type [facility]    PT Start Time 0845    PT Stop Time 0935    PT Time Calculation (min) 50 min    Activity Tolerance Patient tolerated treatment well    Behavior During Therapy [facility] for tasks assessed/performed             Past Medical History:  Diagnosis Date   Aseptic meningitis    Colorblind    Dyslipidemia    Headache    Herpes genitalis in men    Hx of elbow surgery 09/02/2019   right elbow percutaneous ECRB release/lateral elbow percutaneous release-Dr.Gramig   Hyperlipidemia    Lateral epicondylitis of right elbow 03/2018   s/p injection by Dr. Micheline Chapman; MRI 10/2018, f/b eval by Dr. Amedeo Plenty   Low back pain    Meningitis 2019   negative cultures   Torn tendon    R elbow    Past Surgical History:  Procedure Laterality Date   CERVICAL DISC SURGERY  2009   JONES, MD; fusion   ELBOW SURGERY  09/02/2019   right elbow percutaneous ECRB release/lateral elbow percutaneous release-Dr. [place] Right    percutaneous--done by Dr. Amedeo Plenty in either 08/2019 or 09/2019   [place], [place]; L4-L5 (right)   LUMBAR DISC SURGERY  11/2013   L4-5, Dr. Ronnald Ramp (left)   VASECTOMY  11/09    There were no vitals filed for this visit.    Subjective Assessment - 08/04/21 0849     Subjective hx of R shoulder pain starting in July while he was [place] slide at church and the pad slid out out from underneath him and grabbed the bar and caught his entire body weight with the RUE. He  noted immediate pain that continues to fluctuate. He notes the pain is worst at night, and is sore in any positoin. he does report some soreness in the elbow but has of lateral epicondylitis. he has a hx of R AC /[place] seperation when he was younger.    Limitations Lifting    How long can you sit comfortably? unlimited    How long can you stand comfortably? unlimited    How long can you walk comfortably? unlimited    Diagnostic tests imaing at the MD's office    Patient Stated Goals reduce pain    Currently in Pain? Yes    Pain Score 2    at worst 7/10   Pain Orientation Right    Pain Descriptors / Indicators Aching;Sharp    Pain Type Chronic pain    Pain Onset More than a month ago    Pain Frequency Intermittent    Aggravating Factors  sleeping, reaching behind the back, reaching out, quick movements/ reaction, ER/ IR    Pain Relieving Factors getting out of position    Multiple Pain Sites Yes    Pain Score 4   lateral eipcondlyle   Pain Location Elbow    Pain Orientation  Right;Medial;Lateral    Pain Descriptors / Indicators Aching    Pain Type Chronic pain    Pain Onset More than a month ago    Pain Frequency Intermittent    Aggravating Factors  lifting                [facility] PT Assessment - 08/04/21 0001       Assessment   Medical Diagnosis R shoulder tendinitis    Referring Provider (PT) Ophelia Charter MD    Onset Date/Surgical Date --   July 2022   Hand Dominance Right    Prior Therapy no      Precautions   Precautions None      Restrictions   Weight Bearing Restrictions No      Balance Screen   Has the patient fallen in the past 6 months No      [place] residence      Prior Function   Level of Independence Independent    Vocation Full time employment    Vocation Requirements prlonged sitting/ standing      Cognition   Overall Cognitive Status Within Functional Limits for tasks assessed      Observation/Other Assessments    Focus on Therapeutic Outcomes (FOTO)  60%   predicted 72%     ROM / Strength   AROM / PROM / Strength AROM;Strength      AROM   AROM Assessment Site Shoulder    Right/Left Shoulder Right;Left    Right Shoulder Extension 70 Degrees    Right Shoulder Flexion 143 Degrees   pain during eccentric lowering   Right Shoulder ABduction 143 Degrees   pain during eccentric lowering   Right Shoulder Internal Rotation --   T11   Right Shoulder External Rotation --   T4   Left Shoulder Extension 82 Degrees    Left Shoulder Flexion 158 Degrees    Left Shoulder ABduction 164 Degrees    Left Shoulder Internal Rotation --   T8   Left Shoulder External Rotation --   T5     Strength   Strength Assessment Site Shoulder;Hand    Right/Left Shoulder Right;Left    Right Shoulder Flexion 4/5   pain during testing   Right Shoulder Extension 5/5    Right Shoulder ABduction 4/5   pain during test   Right Shoulder Internal Rotation 4/5   pain during testing   Right Shoulder External Rotation 4-/5   pain during testing   Left Shoulder Flexion 5/5    Left Shoulder Extension 5/5    Left Shoulder ABduction 5/5    Left Shoulder Internal Rotation 5/5    Left Shoulder External Rotation 4+/5    Right Hand Grip (lbs) 90   95,95,80 (pain elbow and shoudler   Left Hand Grip (lbs) 115.3   121,112 113     Palpation   Palpation comment TTP along the greater tubercle, and multiple knots /TP in the infrapsinatus and deltoid an dupper trap. along the R medial/ lateral epicondyle and along the flexor/ extensor tendons.      Special Tests    Special Tests Rotator Cuff Impingement    Rotator Cuff Impingment tests Hawkins- Kennedy test;Painful Arc of Motion;other;other2      Hawkins-Kennedy test   Findings Positive    Comments increased with cross body testing      Painful Arc of Motion   Findings Positive    Side Right      other  Findings Positive    Comments pain with exeternal rotation lag sign but no lag       Other    Findings Positive    Comment scapular assist is positive on the R                [facility] Adult PT Treatment/Exercise:  Therapeutic Exercise: Rhythmic stabilization 3 x 30 sec Lower trap strengthening with elbows propped on bolster 2 x 12 with RTB Modified hand position / grip to reduce medial elbow pain Scapular retraction / double ER 2 x 12 with rTB Scaption 1 x 15  unweighted   Manual Therapy:  N/A  Neuromuscular re-ed: N/A  Therapeutic Activity: N/A  Modalities: N/A  Self Care: N/A  Consider / progression for next session:         Objective measurements completed on examination: See above findings.                PT Education - 08/04/21 0946     Education Details evaluation findings, POC, goals, HEPwith proper form    Person(s) Educated Patient    Methods Explanation;Verbal cues;Handout    Comprehension Verbalized understanding;Verbal cues required              PT Short Term Goals - 08/04/21 0954       PT SHORT TERM GOAL #1   Title pt to be IND with initial HEP    Time 6    Period Weeks    Status New    Target Date 09/01/21      PT SHORT TERM GOAL #2   Title pt to report max pain to </= 4/10 for therapuetic progression    Time 6    Period Weeks    Status New    Target Date 09/15/21      PT SHORT TERM GOAL #3   Title increase RUE grip strength to >/= 100# to demo improvement in elbow and shoulder    Baseline 90# on avg    Time 6    Period Weeks    Status New    Target Date 09/15/21               PT Long Term Goals - 08/04/21 0955       PT LONG TERM GOAL #1   Title increase R shoulder AROM to WNL compared bil with </= 2/10 max pain for ROM required for ADLs    Time 12    Period Weeks    Status New    Target Date 10/27/21      PT LONG TERM GOAL #2   Title increase RUE gross strength to >/= 4+/5 to prmote scapular stability and maximize scapulohumeral rhythm    Time 12    Period Weeks     Status New    Target Date 10/27/21      PT LONG TERM GOAL #3   Title increase FOTO score to >/= 72% to demo improvement in function    Time 12    Period Weeks    Status New    Target Date 10/27/21      PT LONG TERM GOAL #4   Title pt to be IND with all HEP to be able to maintain and progress current LOF IND                    Plan - 08/04/21 0946     Clinical Impression Statement pt is a pleasnt 50 y.o Familiar  to this PT presenting to OPPT with CC of R shoulder pain starting in July following an unexpected slip and caught himself with his RUE. He demonstrated mild limitations with the R shoulder AROM compared bil, and gross weakness / soreness with most notbale soreness / weakness with resisted external rotation. He notes hx of medial/ lateral elbow epicondylt pain which may contribute to shoulder issues. TTP noted along the greater tubercle, infrapsinatus and teres minor with mulitple trigger points noted. Special testing in combination with assesment is consistent with rotator cuff involvement. He responded well in session to exercise noting reduction in soreness with flexion but continued soreness with abduction.    Stability/Clinical Decision Making Stable/Uncomplicated    Clinical Decision Making Low    Rehab Potential Good    PT Frequency 2x / week    PT Duration 12 weeks    PT Treatment/Interventions ADLs/Self Care Home Management;Electrical Stimulation;Cryotherapy;Iontophoresis 4mg /ml Dexamethasone;Moist Heat;Ultrasound;Therapeutic exercise;Therapeutic activities;Neuromuscular re-education;Patient/family education;Manual techniques;Passive range of motion;Dry needling;Taping;Functional mobility training    PT Next Visit Plan Review/ update HEP PRN, discuss DN for infraspinatus/ teres minor, post deltoid. scapular stability strengthening, rotator cuff strength    PT Home Exercise Plan [id number] - upper trap stretch, scaption, double ER with scapular retraction, lower trap  strengthenign with elbows on table/ bolster, wrist flexor/ extensor stretch, ceiling punches.    Consulted and Agree with Plan of Care Patient             Patient will benefit from skilled therapeutic intervention in order to improve the following deficits and impairments:  Improper body mechanics, Postural dysfunction, Pain, Decreased strength, Decreased endurance, Decreased activity tolerance, Decreased range of motion  Visit Diagnosis: Chronic right shoulder pain  Muscle weakness (generalized)  Other muscle spasm     Problem List Patient Active Problem List   Diagnosis Date Noted   Mixed hyperlipidemia 12/21/2020   Intractable chronic migraine without aura and without status migrainosus 10/14/2018   Intractable headache 07/17/2018   Aseptic meningitis 04/23/2018   Left knee pain 12/08/2016   Plantar fasciitis of left foot 12/29/2015   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

## 2021-08-10 ENCOUNTER — Other Ambulatory Visit: Payer: Self-pay

## 2021-08-10 ENCOUNTER — Ambulatory Visit: Payer: 59 | Admitting: Physical Therapy

## 2021-08-10 DIAGNOSIS — M6281 Muscle weakness (generalized): Secondary | ICD-10-CM

## 2021-08-10 DIAGNOSIS — M62838 Other muscle spasm: Secondary | ICD-10-CM

## 2021-08-10 DIAGNOSIS — G8929 Other chronic pain: Secondary | ICD-10-CM | POA: Diagnosis not present

## 2021-08-10 DIAGNOSIS — M25511 Pain in right shoulder: Secondary | ICD-10-CM | POA: Diagnosis not present

## 2021-08-10 NOTE — Patient Instructions (Addendum)

## 2021-08-10 NOTE — Therapy (Signed)
Larry Cruz, [place], [address] Phone: [phone]   Fax:  [phone]  Physical Therapy Treatment  Patient Details  Name: Larry Cruz MRN: [id number] Date of Birth: August 21, 1971 Referring Provider (PT): Ophelia Charter MD   Encounter Date: 08/10/2021   PT End of Session - 08/10/21 0934     Visit Number 2    Number of Visits 12    Date for PT Re-Evaluation 10/27/21    Authorization Type [facility]    PT Start Time 0850    PT Stop Time 0947    PT Time Calculation (min) 57 min    Activity Tolerance Patient tolerated treatment well    Behavior During Therapy [facility] for tasks assessed/performed             Past Medical History:  Diagnosis Date   Aseptic meningitis    Colorblind    Dyslipidemia    Headache    Herpes genitalis in men    Hx of elbow surgery 09/02/2019   right elbow percutaneous ECRB release/lateral elbow percutaneous release-Dr.Gramig   Hyperlipidemia    Lateral epicondylitis of right elbow 03/2018   s/p injection by Dr. Micheline Chapman; MRI 10/2018, f/b eval by Dr. Amedeo Plenty   Low back pain    Meningitis 2019   negative cultures   Torn tendon    R elbow    Past Surgical History:  Procedure Laterality Date   CERVICAL DISC SURGERY  2009   JONES, MD; fusion   ELBOW SURGERY  09/02/2019   right elbow percutaneous ECRB release/lateral elbow percutaneous release-Dr. [place] Right    percutaneous--done by Dr. Amedeo Plenty in either 08/2019 or 09/2019   [place], [place]; L4-L5 (right)   [place] SURGERY  11/2013   L4-5, Dr. Ronnald Ramp (left)   VASECTOMY  11/09    There were no vitals filed for this visit.   Subjective Assessment - 08/10/21 0852     Subjective I have residual pain and at night I am not sleeping very well because of it.  I did get a cortisone shot last week and I am a bit better.  My mid back is more inflamed since doing exercises     Currently in Pain? Yes    Pain Score 2    7/10 at worst   Pain Location Shoulder    Pain Orientation Right    Pain Descriptors / Indicators Aching    Pain Type Chronic pain              [facility] Adult PT Treatment/Exercise:   Therapeutic Exercise:  Standing Shoulder star pattern horizontal abd/diagonals -3 sets - 10 reps  Shoulder Extension with Resistance - 3 sets - 10 reps   Manual Therapy:   STW over R pectorals, R upper trap and levator, Teres major. Latissimus dorsi, rhomoboids, periscapular including deltoids.  Cervical paraspinals, with R UPA C-2 to C-6   Neuromuscular re-ed: N/A   Therapeutic Activity: N/A   Modalities: Moist heat pack over R shld and back   Self Care: [place] and importance of movement for healing of tissues.  Compliance with exercises for sub scapular stabilizers   Consider / progression for next session:  Not done this session  Rhythmic stabilization 3 x 30 sec Lower trap strengthening with elbows propped on bolster 2 x 12 with RTB Modified hand position / grip to reduce medial elbow pain Scapular retraction /  double ER 2 x 12 with rTB Scaption 1 x 15  unweighted                     Trigger Point Dry Needling - 08/10/21 0001     Consent Given? Yes    Education Handout Provided Yes    Muscles Treated Head and Neck Upper trapezius;Levator scapulae   R only   Muscles Treated Upper Quadrant Pectoralis major;Pectoralis minor;Rhomboids;Subscapularis;Deltoid;Teres major;Latissimus dorsi;Infraspinatus   R only   Upper Trapezius Response Twitch reponse elicited;Palpable increased muscle length   marked response   Levator Scapulae Response Twitch response elicited;Palpable increased muscle length    Pectoralis Major Response Twitch response elicited;Palpable increased muscle length    Pectoralis Minor Response Twitch response elicited;Palpable increased muscle length    Rhomboids Response Palpable increased muscle length     Subscapularis Response Palpable increased muscle length    Deltoid Response Twitch response elicited;Palpable increased muscle length    Latissimus dorsi Response Palpable increased muscle length    Teres major Response Twitch response elicited;Palpable increased muscle length                   PT Education - 08/10/21 1042     Education Details adde to HEP , importance of movement and consistent exercise,  TPDN education    Person(s) Educated Patient    Methods Explanation;Demonstration;Tactile cues;Verbal cues;Handout    Comprehension Verbalized understanding;Returned demonstration              PT Short Term Goals - 08/04/21 0954       PT SHORT TERM GOAL #1   Title pt to be IND with initial HEP    Time 6    Period Weeks    Status New    Target Date 09/01/21      PT SHORT TERM GOAL #2   Title pt to report max pain to </= 4/10 for therapuetic progression    Time 6    Period Weeks    Status New    Target Date 09/15/21      PT SHORT TERM GOAL #3   Title increase RUE grip strength to >/= 100# to demo improvement in elbow and shoulder    Baseline 90# on avg    Time 6    Period Weeks    Status New    Target Date 09/15/21               PT Long Term Goals - 08/04/21 0955       PT LONG TERM GOAL #1   Title increase R shoulder AROM to WNL compared bil with </= 2/10 max pain for ROM required for ADLs    Time 12    Period Weeks    Status New    Target Date 10/27/21      PT LONG TERM GOAL #2   Title increase RUE gross strength to >/= 4+/5 to prmote scapular stability and maximize scapulohumeral rhythm    Time 12    Period Weeks    Status New    Target Date 10/27/21      PT LONG TERM GOAL #3   Title increase FOTO score to >/= 72% to demo improvement in function    Time 12    Period Weeks    Status New    Target Date 10/27/21      PT LONG TERM GOAL #4   Title pt to be IND with all HEP to be  able to maintain and progress current LOF IND                    Plan - 08/10/21 0856     Clinical Impression Statement Larry Cruz enters clinic with chronic pain in R shld from injury over the summer.  Pt has more pain in shoulder when lowering R arm. but has decreased pain at PT stabilizes R scapula. Pt will benefit from continues strengthening of sub scapular stabilizers once pain is decreased.  Pt consents to TPDN and is closely monitored throughout session. Pt with marked twitch response with Upper trap and pectoral muscles.  Pt tenderness over bicipitial groove and pectoral proximal insertion.  Pt with decreased pain with eccentric lowering post TPDN  Will continue to monitor and progress as pt tolerates.    PT Frequency 2x / week    PT Duration 12 weeks    PT Treatment/Interventions ADLs/Self Care Home Management;Electrical Stimulation;Cryotherapy;Iontophoresis 4mg /ml Dexamethasone;Moist Heat;Ultrasound;Therapeutic exercise;Therapeutic activities;Neuromuscular re-education;Patient/family education;Manual techniques;Passive range of motion;Dry needling;Taping;Functional mobility training    PT Next Visit Plan Review/ update HEP PRN, . scapular stability strengthening, rotator cuff strength    PT Home Exercise Plan [id number] - upper trap stretch, scaption, double ER with scapular retraction, lower trap strengthenign with elbows on table/ bolster, wrist flexor/ extensor stretch, ceiling punches.    Consulted and Agree with Plan of Care Patient             Patient will benefit from skilled therapeutic intervention in order to improve the following deficits and impairments:  Improper body mechanics, Postural dysfunction, Pain, Decreased strength, Decreased endurance, Decreased activity tolerance, Decreased range of motion  Visit Diagnosis: Chronic right shoulder pain  Muscle weakness (generalized)  Other muscle spasm     Problem List Patient Active Problem List   Diagnosis Date Noted   Mixed hyperlipidemia 12/21/2020    Intractable chronic migraine without aura and without status migrainosus 10/14/2018   Intractable headache 07/17/2018   Aseptic meningitis 04/23/2018   Left knee pain 12/08/2016   Plantar fasciitis of left foot 12/29/2015   Cavus deformity of foot 12/29/2015   Pure hypercholesterolemia 06/23/2011   Voncille Lo, PT, [place] Certified Exercise Expert for the Aging Adult  08/10/21 10:47 AM Phone: [phone] Fax: [facility] [address] [place], [place], [address] Phone: [phone]   Fax:  [phone]  Name: Larry Cruz MRN: [id number] Date of Birth: August 04, 1971

## 2021-08-17 ENCOUNTER — Encounter: Payer: Self-pay | Admitting: Physical Therapy

## 2021-08-17 ENCOUNTER — Ambulatory Visit: Payer: 59 | Admitting: Physical Therapy

## 2021-08-17 ENCOUNTER — Other Ambulatory Visit: Payer: Self-pay

## 2021-08-17 DIAGNOSIS — G8929 Other chronic pain: Secondary | ICD-10-CM | POA: Diagnosis not present

## 2021-08-17 DIAGNOSIS — M62838 Other muscle spasm: Secondary | ICD-10-CM

## 2021-08-17 DIAGNOSIS — M6281 Muscle weakness (generalized): Secondary | ICD-10-CM

## 2021-08-17 DIAGNOSIS — M25511 Pain in right shoulder: Secondary | ICD-10-CM | POA: Diagnosis not present

## 2021-08-17 NOTE — Therapy (Signed)
[place], [place], [address] Phone: [phone]   Fax:  [phone]  Physical Therapy Treatment  Patient Details  Name: Larry Cruz MRN: [id number] Date of Birth: 1971/03/23 Referring Provider (PT): Ophelia Charter MD   Encounter Date: 08/17/2021   PT End of Session - 08/17/21 1540     Visit Number 3    Number of Visits 12    Date for PT Re-Evaluation 10/27/21    Authorization Type [facility]    PT Start Time 1540    PT Stop Time 1620    PT Time Calculation (min) 40 min    Activity Tolerance Patient tolerated treatment well    Behavior During Therapy [facility] for tasks assessed/performed             Past Medical History:  Diagnosis Date   Aseptic meningitis    Colorblind    Dyslipidemia    Headache    Herpes genitalis in men    Hx of elbow surgery 09/02/2019   right elbow percutaneous ECRB release/lateral elbow percutaneous release-Dr.Gramig   Hyperlipidemia    Lateral epicondylitis of right elbow 03/2018   s/p injection by Dr. Micheline Chapman; MRI 10/2018, f/b eval by Dr. Amedeo Plenty   Low back pain    Meningitis 2019   negative cultures   Torn tendon    R elbow    Past Surgical History:  Procedure Laterality Date   CERVICAL DISC SURGERY  2009   JONES, MD; fusion   ELBOW SURGERY  09/02/2019   right elbow percutaneous ECRB release/lateral elbow percutaneous release-Dr. [place] Right    percutaneous--done by Dr. Amedeo Plenty in either 08/2019 or 09/2019   LUMBAR [place], [place]; L4-L5 (right)   LUMBAR DISC SURGERY  11/2013   L4-5, Dr. Ronnald Ramp (left)   VASECTOMY  11/09    There were no vitals filed for this visit.   Subjective Assessment - 08/17/21 1540     Subjective "The shoulder is doing better in some aspect, and still sore in some."    Patient Stated Goals reduce pain    Currently in Pain? Yes    Pain Score 4     Pain Location Shoulder    Pain Orientation  Right    Aggravating Factors  quick motions    Pain Relieving Factors sitting and resting.                [facility] PT Assessment - 08/17/21 0001       Assessment   Medical Diagnosis R shoulder tendinitis    Referring Provider (PT) Ophelia Charter MD              [facility] Adult PT Treatment/Exercise:  Therapeutic Exercise: Rhythmic stabilization 3 x 30 sec Sustained shoulder abduciton pushing out on piliates ring with shoulder flexion 2 x 20 Thoracic extension over pink bolster with hands behind head and elbows adducted 2 x 10  Manual Therapy: Skilled palpation and monitoring of pt during TPDN IASTM along infraspinatus / teres minor and deltoid Thoracic PA T1-T10  grade IV Scapular mobs upward rotation R upper trap inhibition taping.   Neuromuscular re-ed: N/A  Therapeutic Activity: N/A  Modalities: N/A  Self Care: N/A  Consider / progression for next session:                   Trigger Point Dry Needling - 08/17/21 0001     Consent Given?  Yes    Education Handout Provided Previously provided    Printmaker Performed with Dry Needling Yes    E-stim with Dry Needling Details CPS L20 x 8 min for infraspinatus and deltoid    Upper Trapezius Response Twitch reponse elicited;Palpable increased muscle length    Levator Scapulae Response Twitch response elicited;Palpable increased muscle length    Subscapularis Response Twitch response elicited;Palpable increased muscle length    Deltoid Response Twitch response elicited;Palpable increased muscle length                     PT Short Term Goals - 08/04/21 0954       PT SHORT TERM GOAL #1   Title pt to be IND with initial HEP    Time 6    Period Weeks    Status New    Target Date 09/01/21      PT SHORT TERM GOAL #2   Title pt to report max pain to </= 4/10 for therapuetic progression    Time 6    Period Weeks    Status New    Target Date 09/15/21      PT SHORT TERM GOAL  #3   Title increase RUE grip strength to >/= 100# to demo improvement in elbow and shoulder    Baseline 90# on avg    Time 6    Period Weeks    Status New    Target Date 09/15/21               PT Long Term Goals - 08/04/21 0955       PT LONG TERM GOAL #1   Title increase R shoulder AROM to WNL compared bil with </= 2/10 max pain for ROM required for ADLs    Time 12    Period Weeks    Status New    Target Date 10/27/21      PT LONG TERM GOAL #2   Title increase RUE gross strength to >/= 4+/5 to prmote scapular stability and maximize scapulohumeral rhythm    Time 12    Period Weeks    Status New    Target Date 10/27/21      PT LONG TERM GOAL #3   Title increase FOTO score to >/= 72% to demo improvement in function    Time 12    Period Weeks    Status New    Target Date 10/27/21      PT LONG TERM GOAL #4   Title pt to be IND with all HEP to be able to maintain and progress current LOF IND                   Plan - 08/17/21 1649     Clinical Impression Statement pt arrives reporting some imprvement but continues to report pain with quick movements. continued TPDN focusing on the  R upper trap, levator scapulae, teres minor, infraspinatus, and middle deltoid combined with Estim focused on the infraspinatus and middle deltoid. Followed TPDN utilized IASTM Techniques and thoracic mobs with cavitation noted. Continued thoracic mobility using foam roller and scapular stability. Trialed upper trap inhibition taping to calm down upper trap activation which he did well with noting improvement in shoulder ROM.    PT Treatment/Interventions ADLs/Self Care Home Management;Electrical Stimulation;Cryotherapy;Iontophoresis 4mg /ml Dexamethasone;Moist Heat;Ultrasound;Therapeutic exercise;Therapeutic activities;Neuromuscular re-education;Patient/family education;Manual techniques;Passive range of motion;Dry needling;Taping;Functional mobility training    PT Next Visit Plan Review/  update HEP PRN. Scapular stability strengthening, rotator  cuff strength    PT Home Exercise Plan [id number] - upper trap stretch, scaption, double ER with scapular retraction, lower trap strengthenign with elbows on table/ bolster, wrist flexor/ extensor stretch, ceiling punches.    Consulted and Agree with Plan of Care Patient             Patient will benefit from skilled therapeutic intervention in order to improve the following deficits and impairments:  Improper body mechanics, Postural dysfunction, Pain, Decreased strength, Decreased endurance, Decreased activity tolerance, Decreased range of motion  Visit Diagnosis: Chronic right shoulder pain  Muscle weakness (generalized)  Other muscle spasm     Problem List Patient Active Problem List   Diagnosis Date Noted   Mixed hyperlipidemia 12/21/2020   Intractable chronic migraine without aura and without status migrainosus 10/14/2018   Intractable headache 07/17/2018   Aseptic meningitis 04/23/2018   Left knee pain 12/08/2016   Plantar fasciitis of left foot 12/29/2015   Cavus deformity of foot 12/29/2015   Pure hypercholesterolemia 06/23/2011    Starr Lake PT, DPT, LAT, ATC  08/17/21  5:00 PM      [facility] [address] [place], [place], [address] Phone: [phone]   Fax:  [phone]  Name: Larry Cruz MRN: [id number] Date of Birth: [birth year]

## 2021-08-19 ENCOUNTER — Ambulatory Visit: Payer: 59 | Attending: Orthopaedic Surgery

## 2021-08-19 ENCOUNTER — Other Ambulatory Visit: Payer: Self-pay

## 2021-08-19 DIAGNOSIS — M25511 Pain in right shoulder: Secondary | ICD-10-CM | POA: Diagnosis not present

## 2021-08-19 DIAGNOSIS — M6281 Muscle weakness (generalized): Secondary | ICD-10-CM | POA: Insufficient documentation

## 2021-08-19 DIAGNOSIS — G8929 Other chronic pain: Secondary | ICD-10-CM | POA: Diagnosis not present

## 2021-08-19 DIAGNOSIS — M62838 Other muscle spasm: Secondary | ICD-10-CM | POA: Insufficient documentation

## 2021-08-20 ENCOUNTER — Other Ambulatory Visit: Payer: Self-pay | Admitting: Family Medicine

## 2021-08-20 ENCOUNTER — Other Ambulatory Visit (HOSPITAL_COMMUNITY): Payer: Self-pay

## 2021-08-20 DIAGNOSIS — E782 Mixed hyperlipidemia: Secondary | ICD-10-CM

## 2021-08-20 MED ORDER — ATORVASTATIN CALCIUM 80 MG PO TABS
80.0000 mg | ORAL_TABLET | Freq: Every day | ORAL | 1 refills | Status: DC
  Filled 2021-08-20 – 2021-09-01 (×2): qty 90, 90d supply, fill #0
  Filled 2022-03-31: qty 90, 90d supply, fill #1

## 2021-08-20 NOTE — Therapy (Signed)
[place], [place], [address] Phone: [phone]   Fax:  [phone]  Physical Therapy Treatment  Patient Details  Name: Larry Cruz MRN: [id number] Date of Birth: 1970-11-12 Referring Provider (PT): Ophelia Charter MD   Encounter Date: 08/19/2021   PT End of Session - 08/20/21 1028     Visit Number 4    Number of Visits 12    Date for PT Re-Evaluation 10/27/21    Authorization Type [facility]    PT Start Time 1550    PT Stop Time 1640    PT Time Calculation (min) 50 min    Activity Tolerance Patient tolerated treatment well    Behavior During Therapy [facility] for tasks assessed/performed             Past Medical History:  Diagnosis Date   Aseptic meningitis    Colorblind    Dyslipidemia    Headache    Herpes genitalis in men    Hx of elbow surgery 09/02/2019   right elbow percutaneous ECRB release/lateral elbow percutaneous release-Dr.Gramig   Hyperlipidemia    Lateral epicondylitis of right elbow 03/2018   s/p injection by Dr. Micheline Chapman; MRI 10/2018, f/b eval by Dr. Amedeo Plenty   Low back pain    Meningitis 2019   negative cultures   Torn tendon    R elbow    Past Surgical History:  Procedure Laterality Date   CERVICAL DISC SURGERY  2009   JONES, MD; fusion   ELBOW SURGERY  09/02/2019   right elbow percutaneous ECRB release/lateral elbow percutaneous release-Dr. [place] Right    percutaneous--done by Dr. Amedeo Plenty in either 08/2019 or 09/2019   LUMBAR [place], MD; L4-L5 (right)   LUMBAR DISC SURGERY  11/2013   L4-5, Dr. Ronnald Ramp (left)   VASECTOMY  11/09    There were no vitals filed for this visit.   Subjective Assessment - 08/20/21 1019     Subjective Pt reports it feels like my whole shulder girdle is inflamed and feels swollen.    Patient Stated Goals reduce pain    Currently in Pain? Yes    Pain Score 4     Pain Location Shoulder    Pain  Orientation Right    Pain Descriptors / Indicators Aching;Throbbing    Pain Type Chronic pain    Pain Onset More than a month ago    Pain Frequency Intermittent    Aggravating Factors  quick motions    Pain Relieving Factors sitting and resting.                                         PT Education - 08/20/21 1021     Education Details Education regarding techniques to encourage tissue remodelling including CFM and prolonged isomentric contractions. Additionally, provided education re: consistent management of pain and swelling per cold packs.    Person(s) Educated Patient    Methods Explanation;Demonstration;Tactile cues;Verbal cues;Handout    Comprehension Verbalized understanding;Returned demonstration;Verbal cues required;Tactile cues required             [facility] Adult PT Treatment/Exercise:  Therapeutic Exercise: - Bicep isometric x5, 30" - ER isometric x5, 30", provoked R GH pain - Shoulder row x15 c RTB, provoked R GH pain - Shoulder ext x15 c RTB, provoked R GH pain  Manual Therapy: - STM to the R upper trap, levator, interscapular muscles with pt reporting muscle soreness throughout the R shoulder girdle - CFM massage was provided to the R proximal bicipital tendon f/b pt returning demonstration   Neuromuscular re-ed: N/A   Therapeutic Activity: N/A   Modalities: Iontophoresis to the R anterior GH jt, bicipital groove c dexamethasone, 4mg /ml, 1 ml, 6 hours. Pt advised against getting wet.   Self Care: N/A   Consider / progression for next session:    PT Short Term Goals - 08/04/21 0954       PT SHORT TERM GOAL #1   Title pt to be IND with initial HEP    Time 6    Period Weeks    Status New    Target Date 09/01/21      PT SHORT TERM GOAL #2   Title pt to report max pain to </= 4/10 for therapuetic progression    Time 6    Period Weeks    Status New    Target Date 09/15/21      PT SHORT TERM GOAL #3   Title increase  RUE grip strength to >/= 100# to demo improvement in elbow and shoulder    Baseline 90# on avg    Time 6    Period Weeks    Status New    Target Date 09/15/21               PT Long Term Goals - 08/04/21 0955       PT LONG TERM GOAL #1   Title increase R shoulder AROM to WNL compared bil with </= 2/10 max pain for ROM required for ADLs    Time 12    Period Weeks    Status New    Target Date 10/27/21      PT LONG TERM GOAL #2   Title increase RUE gross strength to >/= 4+/5 to prmote scapular stability and maximize scapulohumeral rhythm    Time 12    Period Weeks    Status New    Target Date 10/27/21      PT LONG TERM GOAL #3   Title increase FOTO score to >/= 72% to demo improvement in function    Time 12    Period Weeks    Status New    Target Date 10/27/21      PT LONG TERM GOAL #4   Title pt to be IND with all HEP to be able to maintain and progress current LOF IND                   Plan - 08/20/21 1029     Clinical Impression Statement Pt presents to PT reporting his R shoulder girdled feels aggrevated. Pt notes the taping of his upper trap has seemed to help pain is this area. Inhibition taping was still intact and left in place. RTC and peri-scapular strengthening exs seemed to aggrevate the [facility] jt area. See Education for information provided re: symptom management per cold packs and the use of isometric exs and CFM to aide in tissue remodaling. Pt voiced understanding and returned demonstration. Iontophoresis was then provided to the anterior R GH jt, bicipital groove. Will assess response to ionto and application of symtom management and tissue remodelling techniques next session. Pt is to hold on HEP 2 days and then re-introduce as tolerated.    Stability/Clinical Decision Making Stable/Uncomplicated    Clinical Decision Making Low  Rehab Potential Good    PT Frequency 2x / week    PT Duration 12 weeks    PT Treatment/Interventions ADLs/Self Care  Home Management;Electrical Stimulation;Cryotherapy;Iontophoresis 4mg /ml Dexamethasone;Moist Heat;Ultrasound;Therapeutic exercise;Therapeutic activities;Neuromuscular re-education;Patient/family education;Manual techniques;Passive range of motion;Dry needling;Taping;Functional mobility training    PT Next Visit Plan Review/ update HEP PRN. Scapular stability strengthening, rotator cuff strength. Follow up re: symptom management and measures for tissue remodalling techniques, and resonse to iontophoresis.    PT Home Exercise Plan [id number] - upper trap stretch, scaption, double ER with scapular retraction, lower trap strengthenign with elbows on table/ bolster, wrist flexor/ extensor stretch, ceiling punches.    Consulted and Agree with Plan of Care Patient             Patient will benefit from skilled therapeutic intervention in order to improve the following deficits and impairments:  Improper body mechanics, Postural dysfunction, Pain, Decreased strength, Decreased endurance, Decreased activity tolerance, Decreased range of motion  Visit Diagnosis: Chronic right shoulder pain  Muscle weakness (generalized)  Other muscle spasm     Problem List Patient Active Problem List   Diagnosis Date Noted   Mixed hyperlipidemia 12/21/2020   Intractable chronic migraine without aura and without status migrainosus 10/14/2018   Intractable headache 07/17/2018   Aseptic meningitis 04/23/2018   Left knee pain 12/08/2016   Plantar fasciitis of left foot 12/29/2015   Cavus deformity of foot 12/29/2015   Pure hypercholesterolemia 06/23/2011    Gar Ponto MS, PT 08/20/21 11:11 AM   [facility] [address] [place], [place], [address] Phone: [phone]   Fax:  [phone]  Name: Brasen Bundren MRN: [id number] Date of Birth: 1971/08/05

## 2021-08-30 ENCOUNTER — Other Ambulatory Visit (HOSPITAL_COMMUNITY): Payer: Self-pay

## 2021-08-30 DIAGNOSIS — H524 Presbyopia: Secondary | ICD-10-CM | POA: Diagnosis not present

## 2021-09-01 ENCOUNTER — Other Ambulatory Visit (HOSPITAL_COMMUNITY): Payer: Self-pay

## 2021-09-17 ENCOUNTER — Other Ambulatory Visit (HOSPITAL_COMMUNITY): Payer: Self-pay

## 2021-11-12 ENCOUNTER — Other Ambulatory Visit (HOSPITAL_COMMUNITY): Payer: Self-pay | Admitting: Orthopaedic Surgery

## 2021-11-12 ENCOUNTER — Other Ambulatory Visit: Payer: Self-pay | Admitting: Orthopaedic Surgery

## 2021-11-12 DIAGNOSIS — M75101 Unspecified rotator cuff tear or rupture of right shoulder, not specified as traumatic: Secondary | ICD-10-CM

## 2021-11-15 ENCOUNTER — Other Ambulatory Visit: Payer: Self-pay

## 2021-11-15 ENCOUNTER — Ambulatory Visit (HOSPITAL_COMMUNITY)
Admission: RE | Admit: 2021-11-15 | Discharge: 2021-11-15 | Disposition: A | Discharge status: Home / Self Care | Payer: 59 | Source: Ambulatory Visit | Attending: Orthopaedic Surgery | Admitting: Orthopaedic Surgery

## 2021-11-15 DIAGNOSIS — M19011 Primary osteoarthritis, right shoulder: Secondary | ICD-10-CM | POA: Diagnosis not present

## 2021-11-15 DIAGNOSIS — M75101 Unspecified rotator cuff tear or rupture of right shoulder, not specified as traumatic: Secondary | ICD-10-CM | POA: Insufficient documentation

## 2021-11-19 DIAGNOSIS — M25511 Pain in right shoulder: Secondary | ICD-10-CM | POA: Diagnosis not present

## 2021-11-22 ENCOUNTER — Other Ambulatory Visit (INDEPENDENT_AMBULATORY_CARE_PROVIDER_SITE_OTHER): Payer: 59

## 2021-11-22 ENCOUNTER — Other Ambulatory Visit: Payer: Self-pay

## 2021-11-22 DIAGNOSIS — Z23 Encounter for immunization: Secondary | ICD-10-CM

## 2021-11-22 DIAGNOSIS — E782 Mixed hyperlipidemia: Secondary | ICD-10-CM

## 2021-11-22 LAB — LIPID PANEL
Chol/HDL Ratio: 4.4 ratio (ref 0.0–5.0)
Cholesterol, Total: 209 mg/dL — ABNORMAL HIGH (ref 100–199)
HDL: 48 mg/dL (ref >39)
LDL Chol Calc (NIH): 136 mg/dL — ABNORMAL HIGH (ref 0–99)
Triglycerides: 141 mg/dL (ref 0–149)
VLDL Cholesterol Cal: 25 mg/dL (ref 5–40)

## 2021-12-13 ENCOUNTER — Other Ambulatory Visit (HOSPITAL_COMMUNITY): Payer: Self-pay

## 2021-12-13 ENCOUNTER — Other Ambulatory Visit: Payer: Self-pay | Admitting: Family Medicine

## 2021-12-13 DIAGNOSIS — G4700 Insomnia, unspecified: Secondary | ICD-10-CM

## 2021-12-13 DIAGNOSIS — A6000 Herpesviral infection of urogenital system, unspecified: Secondary | ICD-10-CM

## 2021-12-13 MED ORDER — ZOLPIDEM TARTRATE 10 MG PO TABS
10.0000 mg | ORAL_TABLET | Freq: Every evening | ORAL | 0 refills | Status: DC | PRN
  Filled 2021-12-13: qty 30, 30d supply, fill #0

## 2021-12-13 MED ORDER — VALACYCLOVIR HCL 500 MG PO TABS
500.0000 mg | ORAL_TABLET | Freq: Two times a day (BID) | ORAL | 0 refills | Status: DC | PRN
  Filled 2021-12-13: qty 30, 15d supply, fill #0

## 2021-12-16 ENCOUNTER — Other Ambulatory Visit: Payer: Self-pay

## 2021-12-16 ENCOUNTER — Encounter (HOSPITAL_BASED_OUTPATIENT_CLINIC_OR_DEPARTMENT_OTHER): Payer: Self-pay | Admitting: Orthopaedic Surgery

## 2021-12-28 NOTE — Discharge Instructions (Addendum)
Ophelia Charter MD, MPH ?Noemi Chapel, PA-C ?[facility] ?[address], [address] ?[phone] (tel)   ?[phone] (fax) ? ? ?POST-OPERATIVE INSTRUCTIONS - SHOULDER ARTHROSCOPY ? ?WOUND CARE ?You may remove the Operative Dressing on Post-Op Day #3 (72hrs after surgery).   ?Alternatively if you would like you can leave dressing on until follow-up if within 7-8 days but keep it dry. ?Leave steri-strips in place until they fall off on their own, usually 2 weeks postop. ?There may be a small amount of fluid/bleeding leaking at the surgical site.  ?This is normal; the shoulder is filled with fluid during the procedure and can leak for 24-48hrs after surgery.  ?You may change/reinforce the bandage as needed.  ?Use the Cryocuff or Ice as often as possible for the first 7 days, then as needed for pain relief. Always keep a towel, ACE wrap or other barrier between the cooling unit and your skin.  ?You may shower on Post-Op Day #3. Gently pat the area dry. Do not soak the shoulder in water or submerge it. Keep incisions as dry as possible. ?Do not go swimming in the pool or ocean until 4 weeks after surgery or when otherwise instructed.   ? ?EXERCISES/BRACING ?Sling should be used at all times until follow-up.  ?You can remove sling for hygiene.    ?Please continue to ambulate and do not stay sitting or lying for too long. Perform foot and wrist pumps to assist in circulation. ? ?POST-OP MEDICATIONS- Multimodal approach to pain control ?In general your pain will be controlled with a combination of substances.  Prescriptions unless otherwise discussed are electronically sent to your pharmacy.  This is a carefully made plan we use to minimize narcotic use.    ? ?Celebrex - Anti-inflammatory medication taken on a scheduled basis ?Acetaminophen - Non-narcotic pain medicine taken on a scheduled basis  ?Oxycodone - This is a strong narcotic, to be used only on an ?as needed? basis for SEVERE pain. ?Zofran -  take as needed for nausea ? ?FOLLOW-UP ?If you develop a Fever (?101.5), Redness or Drainage from the surgical incision site, please call our office to arrange for an evaluation. ?Please call the office to schedule a follow-up appointment, 10-14 days post-operatively. ? ? ? ?HELPFUL INFORMATION ? ?If you had a block, it will wear off between 8-24 hrs postop typically.  This is period when your pain may go from nearly zero to the pain you would have had postop without the block.  This is an abrupt transition but nothing dangerous is happening.  You may take an extra dose of narcotic when this happens. ? ?You may be more comfortable sleeping in a semi-seated position the first few nights following surgery.  Keep a pillow propped under the elbow and forearm for comfort.  If you have a recliner type of chair it might be beneficial.  If not that is fine too, but it would be helpful to sleep propped up with pillows behind your operated shoulder as well under your elbow and forearm.  This will reduce pulling on the suture lines. ? ?When dressing, put your operative arm in the sleeve first.  When getting undressed, take your operative arm out last.  Loose fitting, button-down shirts are recommended.  Often in the first days after surgery you may be more comfortable keeping your operative arm under your shirt and not through the sleeve. ? ?You may return to work/school in the next couple of days when you feel up  to it.  Desk work and typing in the sling is fine. ? ?We suggest you use the pain medication the first night prior to going to bed, in order to ease any pain when the anesthesia wears off. You should avoid taking pain medications on an empty stomach as it will make you nauseous. ? ?You should wean off your narcotic medicines as soon as you are able.  Most patients will be off or using minimal narcotics before their first postop appointment.  ? ?Do not drink alcoholic beverages or take illicit drugs when taking pain  medications. ? ?It is against the law to drive while taking narcotics.  In some states it is against the law to drive while your arm is in a sling.  ? ?Pain medication may make you constipated.  Below are a few solutions to try in this order: ?Decrease the amount of pain medication if you aren't having pain. ?Drink lots of decaffeinated fluids. ?Drink prune juice and/or eat dried prunes ? ?If the first 3 don't work start with additional solutions ?Take Colace - an over-the-counter stool softener ?Take Senokot - an over-the-counter laxative ?Take Miralax - a stronger over-the-counter laxative ? ?For more information including helpful videos and documents visit our website:  ? ?[email or web address] ? ? ? ?Post Anesthesia Home Care Instructions ? ?Activity: ?Get plenty of rest for the remainder of the day. A responsible individual must stay with you for 24 hours following the procedure.  ?For the next 24 hours, DO NOT: ?-Drive a car ?-Paediatric nurse ?-Drink alcoholic beverages ?-Take any medication unless instructed by your physician ?-Make any legal decisions or sign important papers. ? ?Meals: ?Start with liquid foods such as gelatin or soup. Progress to regular foods as tolerated. Avoid greasy, spicy, heavy foods. If nausea and/or vomiting occur, drink only clear liquids until the nausea and/or vomiting subsides. Call your physician if vomiting continues. ? ?Special Instructions/Symptoms: ?Your throat may feel dry or sore from the anesthesia or the breathing tube placed in your throat during surgery. If this causes discomfort, gargle with warm salt water. The discomfort should disappear within 24 hours. ? ?   ?Regional Anesthesia Blocks ? ?1. Numbness or the inability to move the "blocked" extremity may last from 3-48 hours after placement. The length of time depends on the medication injected and your individual response to the medication. If the numbness is not going away after  48 hours, call your surgeon. ? ?2. The extremity that is blocked will need to be protected until the numbness is gone and the  Strength has returned. Because you cannot feel it, you will need to take extra care to avoid injury. Because it may be weak, you may have difficulty moving it or using it. You may not know what position it is in without looking at it while the block is in effect. ? ?3. For blocks in the legs and feet, returning to weight bearing and walking needs to be done carefully. You will need to wait until the numbness is entirely gone and the strength has returned. You should be able to move your leg and foot normally before you try and bear weight or walk. You will need someone to be with you when you first try to ensure you do not fall and possibly risk injury. ? ?4. Bruising and tenderness at the needle site are common side effects and will resolve in a few days. ? ?5. Persistent numbness or new problems with movement should be communicated to  the surgeon or the [place] [phone] [place] [phone]). Information for Discharge Teaching: ?EXPAREL (bupivacaine liposome injectable suspension)  ? ?Your surgeon or anesthesiologist gave you EXPAREL(bupivacaine) to help control your pain after surgery.  ?EXPAREL is a local anesthetic that provides pain relief by numbing the tissue around the surgical site. ?EXPAREL is designed to release pain medication over time and can control pain for up to 72 hours. ?Depending on how you respond to EXPAREL, you may require less pain medication during your recovery. ? ?Possible side effects: ?Temporary loss of sensation or ability to move in the area where bupivacaine was injected. ?Nausea, vomiting, constipation ?Rarely, numbness and tingling in your mouth or lips, lightheadedness, or anxiety may occur. ?Call your doctor right away if you think you may be experiencing any of these sensations, or if you have other questions  regarding possible side effects. ? ?Follow all other discharge instructions given to you by your surgeon or nurse. Eat a healthy diet and drink plenty of water or other fluids. ? ?If you return to the hospi

## 2021-12-28 NOTE — H&P (Signed)
? ? ?PREOPERATIVE H&P ? ?Chief Complaint: right shoulder cartilage disorder,impingement syndrome, bicep tendinitis, rotator cuff tear,OA ? ?HPI: ?Larry Cruz is a 51 y.o. male who is scheduled for, Procedure(s): ?SHOULDER ARTHROSCOPY WITH SUBACROMIAL DECOMPRESSION, ROTATOR CUFF REPAIR AND BICEP TENDON REPAIR ?SHOULDER ARTHROSCOPY WITH DISTAL CLAVICLE EXCISION ?ARTHROSCOPY SHOULDER/DEBRIDEMENT.  ? 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  He has had right shoulder pain for many months.  He had an acute on chronic injury last year when he was going down a water slide and caught himself.  He had immediate pain in the shoulder.  He has trouble with sleep, as well as activities of daily living.  He has trouble playing tennis as well.  He got some temporary relief from an injection.  He is still bothered by his shoulder.  He cannot sleep. ? ?Symptoms are rated as moderate to severe, and have been worsening.  This is significantly impairing activities of daily living.   ? ?Please see clinic note for further details on this patient's care.   ? ?He has elected for surgical management.  ? ?Past Medical History:  ?Diagnosis Date  ? Aseptic meningitis   ? Colorblind   ? Dyslipidemia   ? Headache   ? Herpes genitalis in men   ? Hx of elbow surgery 09/02/2019  ? right elbow percutaneous ECRB release/lateral elbow percutaneous release-Dr.Gramig  ? Hyperlipidemia   ? Lateral epicondylitis of right elbow 03/2018  ? s/p injection by Dr. Micheline Chapman; MRI 10/2018, f/b eval by Dr. Amedeo Plenty  ? Low back pain   ? Meningitis 2019  ? negative cultures  ? Torn tendon   ? R elbow  ? ?Past Surgical History:  ?Procedure Laterality Date  ? [place] [facility]  2009  ? Ronnald Ramp, MD; fusion  ? [place]OW SURGERY  09/02/2019  ? right elbow percutaneous ECRB release/lateral elbow percutaneous release-Dr. Amedeo Plenty  ? LATERAL EPICONDYLE RELEASE Right   ? percutaneous--done by Dr. Amedeo Plenty in either 08/2019 or 09/2019  ? [place]  SUR[facility]ERY  2000  ? CARTER, MD; L4-L5 (right)  ? [place] [facility]  11/2013  ? L4-5, Dr. Ronnald Ramp (left)  ? VASECTOMY  11/09  ? ?Social History  ? ?Socioeconomic History  ? Marital status: Married  ?  Spouse name: Not on file  ? Number of children: 2  ? Years of education: Not on file  ? Highest education level: Master's degree (e.g., MA, MS, MEng, MEd, MSW, MBA)  ?Occupational History  ? Occupation: Development worker, [employer]  ?  Employer: [employer]  ?Tobacco Use  ? Smoking status: Never  ? Smokeless tobacco: Never  ?Vaping Use  ? Vaping Use: Never used  ?Substance and Sexual Activity  ? Alcohol use: Yes  ? Drug use: No  ? Sexual activity: Yes  ?  Partners: Female  ?  Birth control/protection: Surgical  ?  Comment: vasectomy  ?Other Topics Concern  ? Not on file  ?Social History Narrative  ? Lives with wife, daughter, son and dog  ? Right handed  ? Caffeine: 1-2 cups of coffee daily  ? ?Social Determinants of Health  ? ?Financial Resource Strain: Not on file  ?Food Insecurity: Not on file  ?Transportation Needs: Not on file  ?Physical Activity: Not on file  ?Stress: Not on file  ?Social Connections: Not on file  ? ?Family History  ?Problem Relation Age of Onset  ? Hyperlipidemia Mother   ? Dementia Mother   ?  Heart disease Father   ?     CABG in mid 60's  ? Hypertension Father   ? Alzheimer's disease Father   ? Hyperlipidemia Father   ? Diabetes Father   ? Hyperlipidemia Sister   ? Hyperlipidemia Sister   ? Melanoma Sister   ? Breast cancer Sister 66  ? Cancer Sister   ? Hyperlipidemia Sister   ? Breast cancer Sister 86  ? Cancer Sister   ? Hyperlipidemia Sister   ? Hearing loss Sister   ?     sudden onset, both ears, resolved in 1 only  ? Hyperlipidemia Sister   ? Hyperlipidemia Sister   ? Hyperlipidemia Sister   ? Hyperlipidemia Brother   ? Hypertension Brother   ? Bladder Cancer Brother   ? Hyperlipidemia Brother   ? Hearing loss Brother   ?     sudden onset, resolved  ? Asthma Son   ? Colon cancer Neg Hx    ? Esophageal cancer Neg Hx   ? Rectal cancer Neg Hx   ? Stomach cancer Neg Hx   ? ?No Known Allergies ?Prior to Admission medications   ?Medication Sig Start Date End Date Taking? Authorizing Provider  ?atorvastatin (LIPITOR) 80 MG tablet Take 1 tablet (80 mg total) by mouth daily. 08/20/21  Yes Rita Ohara, MD  ?Esomeprazole Magnesium (NEXIUM 24HR PO) Take 1 capsule by mouth daily as needed.   Yes [provider]  ?meloxicam (MOBIC) 7.5 MG tablet Take 1 tablet (7.5 mg total) by mouth 2 (two) times daily as needed for pain. 07/27/21  Yes   ?Multiple Vitamin (MULTIVITAMIN) tablet Take 1 tablet by mouth daily.   Yes [provider]  ?zolpidem (AMBIEN) 10 MG tablet Take 1 tablet (10 mg total) by mouth at bedtime as needed for sleep. 12/13/21  Yes Rita Ohara, MD  ?valACYclovir (VALTREX) 500 MG tablet Take 1 tablet (500 mg total) by mouth 2 (two) times daily for 3 days as needed for outbreaks. 12/13/21   Rita Ohara, MD  ? ? ?ROS: All other systems have been reviewed and were otherwise negative with the exception of those mentioned in the HPI and as above. ? ?Physical Exam: ?General: Alert, no acute distress ?Cardiovascular: No pedal edema ?Respiratory: No cyanosis, no use of accessory musculature ?GI: No organomegaly, abdomen is soft and non-tender ?Skin: No lesions in the area of chief complaint ?Neurologic: Sensation intact distally ?Psychiatric: Patient is competent for consent with normal mood and affect ?Lymphatic: No axillary or cervical lymphadenopathy ? ?MUSCULOSKELETAL:  ?Right shoulder: Range of motion of the shoulder is full.  He has weakness with infraspinatus testing.  Positive AC tenderness to palpation, impingement and O'Brien's. ? ?Imaging: ?MRI demonstrates punctate near full thickness lesions to the infraspinatus with some cystic changes in the glenoid.   ? ?Assessment: ?right shoulder cartilage disorder,impingement syndrome, bicep tendinitis, rotator cuff tear,OA ? ?Plan: ?Plan for  Procedure(s): ?SHOULDER ARTHROSCOPY WITH SUBACROMIAL DECOMPRESSION, ROTATOR CUFF REPAIR AND BICEP TENDON REPAIR ?SHOULDER ARTHROSCOPY WITH DISTAL CLAVICLE EXCISION ?ARTHROSCOPY SHOULDER/DEBRIDEMENT ? ?The risks benefits and alternatives were discussed with the patient including but not limited to the risks of nonoperative treatment, versus surgical intervention including infection, bleeding, nerve injury,  blood clots, cardiopulmonary complications, morbidity, mortality, among others, and they were willing to proceed.  ? ?The patient acknowledged the explanation, agreed to proceed with the plan and consent was signed.  ? ?Operative Plan: Right shoulder scope with SAD, DCE, BT, RCR ?Discharge Medications: Standard ?DVT Prophylaxis: None ?Physical  Therapy: Outpatient PT ?Special Discharge needs: Sling. IceMan ? ? ?Ethelda Chick, PA-C ? ?12/28/2021 ?7:45 PM ? ?

## 2021-12-30 ENCOUNTER — Ambulatory Visit (HOSPITAL_BASED_OUTPATIENT_CLINIC_OR_DEPARTMENT_OTHER): Payer: 59 | Admitting: Anesthesiology

## 2021-12-30 ENCOUNTER — Encounter (HOSPITAL_BASED_OUTPATIENT_CLINIC_OR_DEPARTMENT_OTHER): Payer: Self-pay | Admitting: Orthopaedic Surgery

## 2021-12-30 ENCOUNTER — Other Ambulatory Visit: Payer: Self-pay

## 2021-12-30 ENCOUNTER — Other Ambulatory Visit (HOSPITAL_COMMUNITY): Payer: Self-pay

## 2021-12-30 ENCOUNTER — Ambulatory Visit (HOSPITAL_BASED_OUTPATIENT_CLINIC_OR_DEPARTMENT_OTHER)
Admission: RE | Admit: 2021-12-30 | Discharge: 2021-12-30 | Disposition: A | Discharge status: Home / Self Care | Payer: 59 | Attending: Orthopaedic Surgery | Admitting: Orthopaedic Surgery

## 2021-12-30 ENCOUNTER — Encounter (HOSPITAL_BASED_OUTPATIENT_CLINIC_OR_DEPARTMENT_OTHER)
Admission: RE | Disposition: A | Discharge status: Home / Self Care | Payer: Self-pay | Source: Home / Self Care | Attending: Orthopaedic Surgery

## 2021-12-30 DIAGNOSIS — X58XXXA Exposure to other specified factors, initial encounter: Secondary | ICD-10-CM | POA: Diagnosis not present

## 2021-12-30 DIAGNOSIS — M7521 Bicipital tendinitis, right shoulder: Secondary | ICD-10-CM | POA: Insufficient documentation

## 2021-12-30 DIAGNOSIS — E669 Obesity, unspecified: Secondary | ICD-10-CM | POA: Insufficient documentation

## 2021-12-30 DIAGNOSIS — Z6830 Body mass index (BMI) 30.0-30.9, adult: Secondary | ICD-10-CM | POA: Diagnosis not present

## 2021-12-30 DIAGNOSIS — M75101 Unspecified rotator cuff tear or rupture of right shoulder, not specified as traumatic: Secondary | ICD-10-CM

## 2021-12-30 DIAGNOSIS — M7541 Impingement syndrome of right shoulder: Secondary | ICD-10-CM

## 2021-12-30 DIAGNOSIS — M7522 Bicipital tendinitis, left shoulder: Secondary | ICD-10-CM | POA: Diagnosis not present

## 2021-12-30 DIAGNOSIS — S46011A Strain of muscle(s) and tendon(s) of the rotator cuff of right shoulder, initial encounter: Secondary | ICD-10-CM | POA: Insufficient documentation

## 2021-12-30 DIAGNOSIS — Z79899 Other long term (current) drug therapy: Secondary | ICD-10-CM | POA: Diagnosis not present

## 2021-12-30 DIAGNOSIS — M19011 Primary osteoarthritis, right shoulder: Secondary | ICD-10-CM | POA: Insufficient documentation

## 2021-12-30 DIAGNOSIS — G8918 Other acute postprocedural pain: Secondary | ICD-10-CM | POA: Diagnosis not present

## 2021-12-30 DIAGNOSIS — S43431A Superior glenoid labrum lesion of right shoulder, initial encounter: Secondary | ICD-10-CM | POA: Diagnosis not present

## 2021-12-30 DIAGNOSIS — M25811 Other specified joint disorders, right shoulder: Secondary | ICD-10-CM | POA: Insufficient documentation

## 2021-12-30 DIAGNOSIS — M24111 Other articular cartilage disorders, right shoulder: Secondary | ICD-10-CM | POA: Diagnosis not present

## 2021-12-30 HISTORY — PX: SHOULDER ARTHROSCOPY: SHX128

## 2021-12-30 HISTORY — PX: SHOULDER ARTHROSCOPY WITH DISTAL CLAVICLE RESECTION: SHX5675

## 2021-12-30 HISTORY — PX: SHOULDER ARTHROSCOPY WITH SUBACROMIAL DECOMPRESSION, ROTATOR CUFF REPAIR AND BICEP TENDON REPAIR: SHX5687

## 2021-12-30 SURGERY — SHOULDER ARTHROSCOPY WITH SUBACROMIAL DECOMPRESSION, ROTATOR CUFF REPAIR AND BICEP TENDON REPAIR
Anesthesia: General | Site: Shoulder | Laterality: Right

## 2021-12-30 MED ORDER — CEFAZOLIN SODIUM-DEXTROSE 2-4 GM/100ML-% IV SOLN
INTRAVENOUS | Status: AC
  Filled 2021-12-30: qty 100

## 2021-12-30 MED ORDER — ROCURONIUM BROMIDE 100 MG/10ML IV SOLN
INTRAVENOUS | Status: DC | PRN
  Administered 2021-12-30: 50 mg via INTRAVENOUS

## 2021-12-30 MED ORDER — FENTANYL CITRATE (PF) 100 MCG/2ML IJ SOLN
INTRAMUSCULAR | Status: DC | PRN
  Administered 2021-12-30: 25 ug via INTRAVENOUS
  Administered 2021-12-30: 50 ug via INTRAVENOUS
  Administered 2021-12-30: 25 ug via INTRAVENOUS

## 2021-12-30 MED ORDER — ACETAMINOPHEN 500 MG PO TABS
ORAL_TABLET | ORAL | Status: AC
  Filled 2021-12-30: qty 2

## 2021-12-30 MED ORDER — LACTATED RINGERS IV SOLN: INTRAVENOUS | Status: DC

## 2021-12-30 MED ORDER — CELECOXIB 100 MG PO CAPS
100.0000 mg | ORAL_CAPSULE | Freq: Two times a day (BID) | ORAL | 0 refills | Status: AC
  Filled 2021-12-30: qty 60, 30d supply, fill #0

## 2021-12-30 MED ORDER — VANCOMYCIN HCL 1000 MG IV SOLR
INTRAVENOUS | Status: AC
  Filled 2021-12-30: qty 40

## 2021-12-30 MED ORDER — PROPOFOL 10 MG/ML IV BOLUS
INTRAVENOUS | Status: DC | PRN
  Administered 2021-12-30: 200 mg via INTRAVENOUS

## 2021-12-30 MED ORDER — ONDANSETRON HCL 4 MG PO TABS
4.0000 mg | ORAL_TABLET | Freq: Three times a day (TID) | ORAL | 0 refills | Status: AC | PRN
  Filled 2021-12-30: qty 10, 4d supply, fill #0

## 2021-12-30 MED ORDER — BUPIVACAINE HCL (PF) 0.5 % IJ SOLN
INTRAMUSCULAR | Status: DC | PRN
Start: 2021-12-30 — End: 2021-12-30
  Administered 2021-12-30: 15 mL via PERINEURAL

## 2021-12-30 MED ORDER — SUGAMMADEX SODIUM 200 MG/2ML IV SOLN
INTRAVENOUS | Status: DC | PRN
  Administered 2021-12-30: 200 mg via INTRAVENOUS

## 2021-12-30 MED ORDER — DEXAMETHASONE SODIUM PHOSPHATE 4 MG/ML IJ SOLN
INTRAMUSCULAR | Status: DC | PRN
Start: 2021-12-30 — End: 2021-12-30
  Administered 2021-12-30: 10 mg via INTRAVENOUS

## 2021-12-30 MED ORDER — ONDANSETRON HCL 4 MG/2ML IJ SOLN
INTRAMUSCULAR | Status: DC | PRN
  Administered 2021-12-30: 4 mg via INTRAVENOUS

## 2021-12-30 MED ORDER — FENTANYL CITRATE (PF) 100 MCG/2ML IJ SOLN
INTRAMUSCULAR | Status: AC
  Filled 2021-12-30: qty 2

## 2021-12-30 MED ORDER — PROPOFOL 10 MG/ML IV BOLUS
INTRAVENOUS | Status: AC
  Filled 2021-12-30: qty 20

## 2021-12-30 MED ORDER — LIDOCAINE 2% (20 MG/ML) 5 ML SYRINGE
INTRAMUSCULAR | Status: AC
  Filled 2021-12-30: qty 5

## 2021-12-30 MED ORDER — ONDANSETRON HCL 4 MG/2ML IJ SOLN: 4.0000 mg | Freq: Once | INTRAMUSCULAR | Status: DC | PRN

## 2021-12-30 MED ORDER — MIDAZOLAM HCL 2 MG/2ML IJ SOLN
INTRAMUSCULAR | Status: AC
  Filled 2021-12-30: qty 2

## 2021-12-30 MED ORDER — TRANEXAMIC ACID-NACL 1000-0.7 MG/100ML-% IV SOLN
1000.0000 mg | INTRAVENOUS | Status: AC
  Administered 2021-12-30: 1000 mg via INTRAVENOUS

## 2021-12-30 MED ORDER — ROCURONIUM BROMIDE 10 MG/ML (PF) SYRINGE
PREFILLED_SYRINGE | INTRAVENOUS | Status: AC
  Filled 2021-12-30: qty 10

## 2021-12-30 MED ORDER — FENTANYL CITRATE (PF) 100 MCG/2ML IJ SOLN
25.0000 ug | INTRAMUSCULAR | Status: DC | PRN
  Administered 2021-12-30 (×2): 25 ug via INTRAVENOUS

## 2021-12-30 MED ORDER — CEFAZOLIN SODIUM-DEXTROSE 2-4 GM/100ML-% IV SOLN
2.0000 g | INTRAVENOUS | Status: AC
  Administered 2021-12-30: 2 g via INTRAVENOUS

## 2021-12-30 MED ORDER — TRANEXAMIC ACID-NACL 1000-0.7 MG/100ML-% IV SOLN
INTRAVENOUS | Status: AC
  Filled 2021-12-30: qty 100

## 2021-12-30 MED ORDER — ACETAMINOPHEN 500 MG PO TABS
1000.0000 mg | ORAL_TABLET | Freq: Once | ORAL | Status: AC
  Administered 2021-12-30: 1000 mg via ORAL

## 2021-12-30 MED ORDER — OXYCODONE HCL 5 MG/5ML PO SOLN: 5.0000 mg | Freq: Once | ORAL | Status: AC | PRN

## 2021-12-30 MED ORDER — OXYCODONE HCL 5 MG PO TABS
ORAL_TABLET | ORAL | Status: AC
  Filled 2021-12-30: qty 1

## 2021-12-30 MED ORDER — OXYCODONE HCL 5 MG PO TABS
5.0000 mg | ORAL_TABLET | Freq: Once | ORAL | Status: AC | PRN
  Administered 2021-12-30: 5 mg via ORAL

## 2021-12-30 MED ORDER — ACETAMINOPHEN 500 MG PO TABS
1000.0000 mg | ORAL_TABLET | Freq: Three times a day (TID) | ORAL | 0 refills | Status: AC
  Filled 2021-12-30: qty 84, 14d supply, fill #0

## 2021-12-30 MED ORDER — BUPIVACAINE LIPOSOME 1.3 % IJ SUSP
INTRAMUSCULAR | Status: DC | PRN
  Administered 2021-12-30: 10 mL via PERINEURAL

## 2021-12-30 MED ORDER — DEXAMETHASONE SODIUM PHOSPHATE 10 MG/ML IJ SOLN
INTRAMUSCULAR | Status: AC
  Filled 2021-12-30: qty 1

## 2021-12-30 MED ORDER — OXYCODONE HCL 5 MG PO TABS
ORAL_TABLET | ORAL | 0 refills | Status: AC
  Filled 2021-12-30: qty 30, 5d supply, fill #0

## 2021-12-30 MED ORDER — ONDANSETRON HCL 4 MG/2ML IJ SOLN
INTRAMUSCULAR | Status: AC
  Filled 2021-12-30: qty 2

## 2021-12-30 MED ORDER — FENTANYL CITRATE (PF) 100 MCG/2ML IJ SOLN
50.0000 ug | Freq: Once | INTRAMUSCULAR | Status: AC
  Administered 2021-12-30: 50 ug via INTRAVENOUS

## 2021-12-30 MED ORDER — MIDAZOLAM HCL 2 MG/2ML IJ SOLN
2.0000 mg | Freq: Once | INTRAMUSCULAR | Status: AC
Start: 2021-12-30 — End: 2021-12-30
  Administered 2021-12-30: 2 mg via INTRAVENOUS

## 2021-12-30 MED ORDER — EPINEPHRINE PF 1 MG/ML IJ SOLN
INTRAMUSCULAR | Status: AC
  Filled 2021-12-30: qty 1

## 2021-12-30 MED ORDER — LIDOCAINE HCL (CARDIAC) PF 100 MG/5ML IV SOSY
PREFILLED_SYRINGE | INTRAVENOUS | Status: DC | PRN
  Administered 2021-12-30: 40 mg via INTRAVENOUS

## 2021-12-30 SURGICAL SUPPLY — 60 items
AID PSTN UNV HD RSTRNT DISP (MISCELLANEOUS) ×1
ANCH SUT 2 FBRTK KNTLS 1.8 (Anchor) ×4 IMPLANT
ANCH SUT SWLK 19.1X4.75 (Anchor) ×1 IMPLANT
ANCHOR SUT 1.8 FIBERTAK SB KL (Anchor) ×4 IMPLANT
ANCHOR SUT BIO SW 4.75X19.1 (Anchor) ×1 IMPLANT
APL PRP STRL LF DISP 70% ISPRP (MISCELLANEOUS) ×1
BLADE EXCALIBUR 4.0X13 (MISCELLANEOUS) ×2 IMPLANT
BLADE SURG 10 STRL SS (BLADE) IMPLANT
BURR OVAL 8 FLU 4.0X13 (MISCELLANEOUS) ×1 IMPLANT
CANNULA 5.75X71 LONG (CANNULA) IMPLANT
CANNULA PASSPORT 5 (CANNULA) IMPLANT
CANNULA PASSPORT BUTTON 10-40 (CANNULA) ×1 IMPLANT
CANNULA TWIST IN 8.25X7CM (CANNULA) ×1 IMPLANT
CHLORAPREP W/TINT 26 (MISCELLANEOUS) ×2 IMPLANT
COOLER ICEMAN CLASSIC (MISCELLANEOUS) ×2 IMPLANT
DRAPE IMP U-DRAPE 54X76 (DRAPES) ×2 IMPLANT
DRAPE INCISE IOBAN 66X45 STRL (DRAPES) ×1 IMPLANT
DRAPE SHOULDER BEACH CHAIR (DRAPES) ×2 IMPLANT
DRSG PAD ABDOMINAL 8X10 ST (GAUZE/BANDAGES/DRESSINGS) ×2 IMPLANT
DW OUTFLOW CASSETTE/TUBE SET (MISCELLANEOUS) ×2 IMPLANT
GAUZE SPONGE 4X4 12PLY STRL (GAUZE/BANDAGES/DRESSINGS) ×2 IMPLANT
GLOVE BIO SURGEON STRL SZ 6.5 (GLOVE) ×4 IMPLANT
GLOVE BIOGEL PI IND STRL 6.5 (GLOVE) ×1 IMPLANT
GLOVE BIOGEL PI IND STRL 8 (GLOVE) ×1 IMPLANT
GLOVE BIOGEL PI INDICATOR 6.5 (GLOVE) ×2
GLOVE BIOGEL PI INDICATOR 8 (GLOVE) ×1
GLOVE ECLIPSE 8.0 STRL XLNG CF (GLOVE) ×2 IMPLANT
GOWN STRL REUS W/ TWL LRG LVL3 (GOWN DISPOSABLE) ×2 IMPLANT
GOWN STRL REUS W/TWL LRG LVL3 (GOWN DISPOSABLE) ×4
GOWN STRL REUS W/TWL XL LVL3 (GOWN DISPOSABLE) ×2 IMPLANT
KIT SHOULDER STAB MARCO (KITS) ×2 IMPLANT
KIT STR SPEAR 1.8 FBRTK DISP (KITS) ×1 IMPLANT
LASSO CRESCENT QUICKPASS (SUTURE) IMPLANT
MANIFOLD NEPTUNE II (INSTRUMENTS) ×2 IMPLANT
NDL SAFETY ECLIPSE 18X1.5 (NEEDLE) ×1 IMPLANT
NDL SCORPION MULTI FIRE (NEEDLE) IMPLANT
NEEDLE HYPO 18GX1.5 SHARP (NEEDLE) ×2
NEEDLE SCORPION MULTI FIRE (NEEDLE) IMPLANT
PACK ARTHROSCOPY DSU (CUSTOM PROCEDURE TRAY) ×2 IMPLANT
PACK BASIN DAY SURGERY FS (CUSTOM PROCEDURE TRAY) ×2 IMPLANT
PAD COLD SHLDR WRAP-ON (PAD) ×2 IMPLANT
PORT APPOLLO RF 90DEGREE MULTI (SURGICAL WAND) ×2 IMPLANT
RESTRAINT HEAD UNIVERSAL NS (MISCELLANEOUS) ×2 IMPLANT
SHEET MEDIUM DRAPE 40X70 STRL (DRAPES) IMPLANT
SLEEVE SCD COMPRESS KNEE MED (STOCKING) ×2 IMPLANT
SLING ARM FOAM STRAP LRG (SOFTGOODS) IMPLANT
STRIP CLOSURE SKIN 1/2X4 (GAUZE/BANDAGES/DRESSINGS) ×2 IMPLANT
SUT FIBERWIRE #2 38 T-5 BLUE (SUTURE)
SUT MNCRL AB 4-0 PS2 18 (SUTURE) ×2 IMPLANT
SUT PDS AB 1 CT  36 (SUTURE) ×2
SUT PDS AB 1 CT 36 (SUTURE) IMPLANT
SUT TIGER TAPE 7 IN WHITE (SUTURE) IMPLANT
SUTURE FIBERWR #2 38 T-5 BLUE (SUTURE) IMPLANT
SUTURE TAPE TIGERLINK 1.3MM BL (SUTURE) IMPLANT
SUTURETAPE TIGERLINK 1.3MM BL (SUTURE)
SYR 5ML LL (SYRINGE) ×2 IMPLANT
TAPE FIBER 2MM 7IN #2 BLUE (SUTURE) IMPLANT
TOWEL GREEN STERILE FF (TOWEL DISPOSABLE) ×4 IMPLANT
TUBE CONNECTING 20X1/4 (TUBING) ×2 IMPLANT
TUBING ARTHROSCOPY IRRIG 16FT (MISCELLANEOUS) ×2 IMPLANT

## 2021-12-30 NOTE — Op Note (Signed)
Orthopaedic Surgery Operative Note (CSN: [id number]) ? ?Larry Cruz  July 23, 1971 ?Date of Surgery: 12/30/2021 ? ? ?DIAGNOSES: ?Right shoulder, chronic rotator cuff tear, SLAP tear, biceps tendinitis, AC arthritis, and subacromial impingement. ? ?POST-OPERATIVE DIAGNOSIS: same ? ?PROCEDURE: ?Arthroscopic extensive debridement - 29823 Subdeltoid Bursa, Supraspinatus Tendon, Anterior Labrum, and Superior Labrum ?Arthroscopic distal clavicle excision - 14970 ?Arthroscopic subacromial decompression - [phone] ?Arthroscopic rotator cuff repair - [phone] ?Arthroscopic biceps tenodesis - 27741 ?  ?OPERATIVE FINDING: ?Exam under anesthesia: Normal ?Articular space: Normal ?Chondral surfaces: Normal ?Biceps:  Type II SLAP tear ?Subscapularis: Incomplete tear small upper border tear.  This was not destabilizing.  We debrided it back. ?Supraspinatus: Incomplete tear at the junction of the supra and infraspinatus there was an undersurface articular sided tear measuring about 50%.  The cable crescent was still intact.  We felt that this was still symptomatic as the patient had symptoms on exam.  We felt that a PA STA repair was appropriate.  Robust repair noted. ?Infraspinatus: Incomplete tear as above ? ? ? ? ?Post-operative plan: The patient will be non-weightbearing in a sling for 6 weeks following a rotator cuff protocol.  The patient will be discharged home.  DVT prophylaxis not indicated in ambulatory upper extremity patient without known risk factors.   Pain control with PRN pain medication preferring oral medicines.  Follow up plan will be scheduled in approximately 7 days for incision check and XR. ? ?Surgeons:Primary: Hiram Gash, MD ?Assistants:Caroline McBane PA-C ?Location: [facility] OR ROOM 1 ?Anesthesia: General with Exparel interscalene block ?Antibiotics: Ancef 2 g ?Tourniquet time: None ?Estimated Blood Loss: Minimal ?Complications: None ?Specimens: None ?Implants: ?Implant Name Type Inv. Item Serial No. Manufacturer  Lot No. LRB No. Used Action  ?ANCHOR SUT 1.8 FIBERTAK SB KL - [id number] Anchor ANCHOR SUT 1.8 FIBERTAK SB KL  [employer] [id number] Right 1 Implanted  ?ANCHOR SUT 1.8 FIBERTAK SB KL - [id number] Anchor ANCHOR SUT 1.8 FIBERTAK SB KL  [employer] [id number] Right 1 Implanted  ?ANCHOR SUT 1.8 FIBERTAK SB KL - [id number] Anchor ANCHOR SUT 1.8 FIBERTAK SB KL  [employer] [id number] Right 1 Implanted  ?ANCHOR SUT 1.8 FIBERTAK SB KL - [id number] Anchor ANCHOR SUT 1.8 FIBERTAK SB KL  [employer] [id number] Right 1 Implanted  ?ANCHOR SUT BIO SW 4.75X19.1 - [id number] Anchor ANCHOR SUT BIO SW 4.75X19.1  Rolinda Roan [id number] Right 1 Implanted  ? ? ?Indications for Surgery:   ?Larry Cruz is a 51 y.o. male with continued shoulder pain refractory to nonoperative measures for extended period of time.    The risks and benefits were explained at length including but not limited to continued pain, cuff failure, biceps tenodesis failure, stiffness, need for further surgery and infection. ? ? ?Procedure:   ?Patient was correctly identified in the preoperative holding area and operative site marked.  Patient brought to OR and positioned beachchair on an [place] table ensuring that all bony prominences were padded and the head was in an appropriate location.  Anesthesia was induced and the operative shoulder was prepped and draped in the usual sterile fashion.  Timeout was called preincision. ? ?A standard posterior viewing portal was made after localizing the portal with a spinal needle.  An anterior accessory portal was also made.  After clearing the articular space the camera was positioned in the subacromial space.  Findings above.   ? ?Extensive debridement was performed of the anterior interval tissue, labral fraying and the bursa. ? ?Subacromial decompression: We made a lateral  portal with spinal needle guidance. We then proceeded to debride bursal tissue extensively with a shaver and arthrocare device. At that point we continued to  identify the borders of the acromion and identify the spur. We then carefully preserved the deltoid fascia and used a burr to convert the acromion to a Type 1 flat acromion without issue. ? ?Biceps tenodesis: We marked the tendon and then performed a tenotomy and debridement of the stump in the articular space. We then identified the biceps tendon in its groove suprapec with the arthroscope in the lateral portal taking care to move from lateral to medial to avoid injury to the subscapularis. At that point we unroofed the tendon itself and mobilized it. An accessory anterior portal was made in line with the tendon and we grasped it from the anterior superior portal and worked from the accessory anterior portal. Two Fibertak 1.63m knotless anchors were placed in the groove and the tendon was secured in a luggage loop style fashion with a pass of the limb of suture through the tendon using a scorpion device to avoid pull-through.  Repair was completed with good tension on the tendon.  Residual stump of the tendon was removed after being resected with a RF ablator. ? ?Distal Clavicle resection:  The scope was placed in the subacromial space from the posterior portal.  A hemostat was placed through the anterior portal and we spread at the [facility].  A burr was then inserted and 10 mm of distal clavicle was resected taking care to avoid damage to the capsule around the joint and avoiding overhanging bone posteriorly.   ? ?Patient had a undersurface 50% tear at the junction of the infra and supraspinatus.  The articular surface was torn however the bursal side looked pristine.  Based on this we felt that a PA STA repair was appropriate.  We prepared the bony surface on the tuberosity for healing from the articular side.  That point we placed two 1.8 fiber tack anchors placed trans tendon capturing the cable crescent while placing the anchor at the medial articular border of the tuberosity.  Once both anchors were shuttled  we went to the subacromial space and shuttled the sutures performing a double tiedown medially.  We checked the position and the reduction of the cuff as we proceeded.  We able to reduce the cuff anatomically.  We then took the tails of the sutures over to a lateral row 4.75 swivel lock placed 8 to 10 mm below the tip of the tuberosity.  We had an anatomic reduction of the cuff. ? ?The incisions were closed with absorbable monocryl and steri strips.  A sterile dressing was placed along with a sling. The patient was awoken from general anesthesia and taken to the PACU in stable condition without complication.  ? ?CNoemi Chapel PA-C, present and scrubbed throughout the case, critical for completion in a timely fashion, and for retraction, instrumentation, closure. ? ? ?

## 2021-12-30 NOTE — Progress Notes (Signed)
Assisted Dr. Brock with right, interscalene , ultrasound guided block. Side rails up, monitors on throughout procedure. See vital signs in flow sheet. Tolerated Procedure well. 

## 2021-12-30 NOTE — Anesthesia Preprocedure Evaluation (Addendum)
Anesthesia Evaluation  ?Patient identified by MRN, date of birth, ID band ?Patient awake ? ? ? ?Reviewed: ?Allergy & Precautions, NPO status , Patient's Chart, lab work & pertinent test results ? ?History of Anesthesia Complications ?Negative for: history of anesthetic complications ? ?Airway ?Mallampati: II ? ?TM Distance: >3 FB ?Neck ROM: Full ? ? ? Dental ? ?(+) Dental Advisory Given, Teeth Intact ?  ?Pulmonary ?neg pulmonary ROS,  ?  ?Pulmonary exam normal ? ? ? ? ? ? ? Cardiovascular ?negative cardio ROS ?Normal cardiovascular exam ? ? ?  ?Neuro/Psych ? Headaches, negative psych ROS  ? GI/Hepatic ?negative GI ROS, Neg liver ROS,   ?Endo/Other  ? ?Obesity ? ? Renal/GU ?negative Renal ROS  ? ?  ?Musculoskeletal ? ?(+) Arthritis ,  ? Abdominal ?  ?Peds ? Hematology ?negative hematology ROS ?(+)   ?Anesthesia Other Findings ?HSV ? Reproductive/Obstetrics ? ?  ? ? ? ? ? ? ? ? ? ? ? ? ? ?  ?  ? ? ? ? ? ? ? ?Anesthesia Physical ?Anesthesia Plan ? ?ASA: 2 ? ?Anesthesia Plan: General  ? ?Post-op Pain Management: Tylenol PO (pre-op)* and Regional block*  ? ?Induction: Intravenous ? ?PONV Risk Score and Plan: 2 and Treatment may vary due to age or medical condition, Ondansetron, Dexamethasone and Midazolam ? ?Airway Management Planned: Oral ETT ? ?Additional Equipment: None ? ?Intra-op Plan:  ? ?Post-operative Plan: Extubation in OR ? ?Informed Consent: I have reviewed the patients History and Physical, chart, labs and discussed the procedure including the risks, benefits and alternatives for the proposed anesthesia with the patient or authorized representative who has indicated his/her understanding and acceptance.  ? ? ? ?Dental advisory given ? ?Plan Discussed with: CRNA and Anesthesiologist ? ?Anesthesia Plan Comments:   ? ? ? ? ? ?Anesthesia Quick Evaluation ? ?

## 2021-12-30 NOTE — Anesthesia Procedure Notes (Signed)
Anesthesia Regional Block: Interscalene brachial plexus block  ? ?Pre-Anesthetic Checklist: , timeout performed,  Correct Patient, Correct Site, Correct Laterality,  Correct Procedure, Correct Position, site marked,  Risks and benefits discussed,  Surgical consent,  Pre-op evaluation,  At surgeon's request and post-op pain management ? ?Laterality: Right ? ?Prep: chloraprep     ?  ?Needles:  ?Injection technique: Single-shot ? ?Needle Type: Echogenic Needle   ? ? ?Needle Length: 5cm  ?Needle Gauge: 21  ? ? ? ?Additional Needles: ? ? ?Narrative:  ?Start time: 12/30/2021 7:58 AM ?End time: 12/30/2021 8:01 AM ?Injection made incrementally with aspirations every 5 mL. ? ?Performed by: Personally  ?Anesthesiologist: [name], MD ? ?Additional Notes: ?No pain on injection. No increased resistance to injection. Injection made in 5cc increments. Good needle visualization. Patient tolerated the procedure well. ? ? ? ? ?

## 2021-12-30 NOTE — Anesthesia Procedure Notes (Addendum)
Procedure Name: Intubation ?Date/Time: 12/30/2021 8:43 AM ?Performed by: Ezequiel Kayser, CRNA ?Pre-anesthesia Checklist: Patient identified, Emergency Drugs available, Suction available and Patient being monitored ?Patient Re-evaluated:Patient Re-evaluated prior to induction ?Oxygen Delivery Method: Circle System Utilized ?Preoxygenation: Pre-oxygenation with 100% oxygen ?Induction Type: IV induction ?Ventilation: Mask ventilation without difficulty ?Laryngoscope Size: Sabra Heck and 2 ?Tube type: Oral ?Tube size: 7.0 mm ?Number of attempts: 2 ?Airway Equipment and Method: Stylet and Oral airway ?Placement Confirmation: ETT inserted through vocal cords under direct vision, positive ETCO2 and breath sounds checked- equal and bilateral ?Secured at: 23 cm ?Tube secured with: Tape ?Dental Injury: Teeth and Oropharynx as per pre-operative assessment  ?Comments: Grade 3 view with mac 4 by CRNA. Atraumatic intubation with miller 2 by Dr. Fransisco Beau ? ? ? ? ?

## 2021-12-30 NOTE — Transfer of Care (Signed)
Immediate Anesthesia Transfer of Care Note ? ?Patient: Larry Cruz ? ?Procedure(s) Performed: SHOULDER ARTHROSCOPY WITH SUBACROMIAL DECOMPRESSION, ROTATOR CUFF REPAIR AND BICEP TENDON REPAIR (Right: Shoulder) ?SHOULDER ARTHROSCOPY WITH DISTAL CLAVICLE EXCISION (Right: Shoulder) ?ARTHROSCOPY SHOULDER/DEBRIDEMENT (Right: Shoulder) ? ?Patient Location: PACU ? ?Anesthesia Type:General and Regional ? ?Level of Consciousness: awake ? ?Airway & Oxygen Therapy: Patient Spontanous Breathing and Patient connected to face mask oxygen ? ?Post-op Assessment: Report given to RN and Post -op Vital signs reviewed and stable ? ?Post vital signs: Reviewed and stable ? ?Last Vitals:  ?Vitals Value Taken Time  ?BP 148/88 12/30/21 1008  ?Temp    ?Pulse 85 12/30/21 1009  ?Resp 20 12/30/21 1009  ?SpO2 98 % 12/30/21 1009  ?Vitals shown include unvalidated device data. ? ?Last Pain:  ?Vitals:  ? 12/30/21 0656  ?TempSrc: Oral  ?PainSc: 0-No pain  ?   ? ?  ? ?Complications: No notable events documented. ?

## 2021-12-30 NOTE — Anesthesia Postprocedure Evaluation (Signed)
Anesthesia Post Note ? ?Patient: Larry Cruz ? ?Procedure(s) Performed: SHOULDER ARTHROSCOPY WITH SUBACROMIAL DECOMPRESSION, ROTATOR CUFF REPAIR AND BICEP TENDON REPAIR (Right: Shoulder) ?SHOULDER ARTHROSCOPY WITH DISTAL CLAVICLE EXCISION (Right: Shoulder) ?ARTHROSCOPY SHOULDER/DEBRIDEMENT (Right: Shoulder) ? ?  ? ?Patient location during evaluation: PACU ?Anesthesia Type: General ?Level of consciousness: awake and alert ?Pain management: pain level controlled ?Vital Signs Assessment: post-procedure vital signs reviewed and stable ?Respiratory status: spontaneous breathing, nonlabored ventilation and respiratory function stable ?Cardiovascular status: stable and blood pressure returned to baseline ?Anesthetic complications: no ? ? ?No notable events documented. ? ?Last Vitals:  ?Vitals:  ? 12/30/21 1024 12/30/21 1030  ?BP:  122/75  ?Pulse: 75 72  ?Resp: 19 19  ?Temp:    ?SpO2: 95% 95%  ?  ?Last Pain:  ?Vitals:  ? 12/30/21 1030  ?TempSrc:   ?PainSc: 5   ? ? ?  ?  ?  ?  ?  ?  ? ?[name] ? ? ? ? ?

## 2021-12-30 NOTE — Interval H&P Note (Signed)
All questions answered, patient wants to proceed with procedure. ? ?

## 2021-12-30 NOTE — Therapy (Signed)
?OUTPATIENT PHYSICAL THERAPY SHOULDER EVALUATION ? ? ?Patient Name: Larry Cruz ?MRN: [id number] ?DOB:03-Mar-1971, 51 y.o., male ?Today's Date: 01/03/2022 ? ? PT End of Session - 01/03/22 0840   ? ? Visit Number 1   ? Authorization Type [facility]   ? Progress Note Due on Visit 10   ? PT Start Time 0845   ? PT Stop Time 0915   2 units, eval  ? PT Time Calculation (min) 30 min   ? Activity Tolerance Patient limited by pain   ? Behavior During Therapy [facility] for tasks assessed/performed   ? ?  ?  ? ?  ? ? ?Past Medical History:  ?Diagnosis Date  ? Aseptic meningitis   ? Colorblind   ? Dyslipidemia   ? Headache   ? Herpes genitalis in men   ? Hx of elbow surgery 09/02/2019  ? right elbow percutaneous ECRB release/lateral elbow percutaneous release-Dr.Gramig  ? Hyperlipidemia   ? Lateral epicondylitis of right elbow 03/2018  ? s/p injection by Dr. Micheline Chapman; MRI 10/2018, f/b eval by Dr. Amedeo Plenty  ? Low back pain   ? Meningitis 2019  ? negative cultures  ? Torn tendon   ? R elbow  ? ?Past Surgical History:  ?Procedure Laterality Date  ? [place] SURGERY  2009  ? Ronnald Ramp, MD; fusion  ? ELBOW SURGERY  09/02/2019  ? right elbow percutaneous ECRB release/lateral elbow percutaneous release-Dr. Amedeo Plenty  ? LATERAL EPICONDYLE RELEASE Right   ? percutaneous--done by Dr. Amedeo Plenty in either 08/2019 or 09/2019  ? [place] SURGERY  2000  ? CARTER, MD; L4-L5 (right)  ? [place] SURGERY  11/2013  ? L4-5, Dr. Ronnald Ramp (left)  ? SHOULDER ARTHROSCOPY Right 12/30/2021  ? Procedure: ARTHROSCOPY SHOULDER/DEBRIDEMENT;  Surgeon: Hiram Gash, MD;  Location: [place];  Service: Orthopedics;  Laterality: Right;  ? SHOULDER ARTHROSCOPY WITH DISTAL CLAVICLE RESECTION Right 12/30/2021  ? Procedure: SHOULDER ARTHROSCOPY WITH DISTAL CLAVICLE EXCISION;  Surgeon: Hiram Gash, MD;  Location: [place];  Service: Orthopedics;  Laterality: Right;  ? SHOULDER ARTHROSCOPY WITH SUBACROMIAL DECOMPRESSION, ROTATOR CUFF REPAIR  AND BICEP TENDON REPAIR Right 12/30/2021  ? Procedure: SHOULDER ARTHROSCOPY WITH SUBACROMIAL DECOMPRESSION, ROTATOR CUFF REPAIR AND BICEP TENDON REPAIR;  Surgeon: Hiram Gash, MD;  Location: [place];  Service: Orthopedics;  Laterality: Right;  ? VASECTOMY  11/09  ? ?Patient Active Problem List  ? Diagnosis Date Noted  ? Mixed hyperlipidemia 12/21/2020  ? Intractable chronic migraine without aura and without status migrainosus 10/14/2018  ? Intractable headache 07/17/2018  ? Aseptic meningitis 04/23/2018  ? Left knee pain 12/08/2016  ? Plantar fasciitis of left foot 12/29/2015  ? Cavus deformity of foot 12/29/2015  ? Pure hypercholesterolemia 06/23/2011  ? ? ?PCP: Rita Ohara, MD ? ?REFERRING PROVIDER: Hiram Gash, MD ? ?REFERRING DIAG: S/P right shoulder arthroscopy with subacromial decompression ,distal clavicle excision ,biceps ? ?THERAPY DIAG:  ?Right shoulder pain, unspecified chronicity ? ?Abnormal posture ? ?Muscle weakness (generalized) ? ? ?ONSET DATE: right shoulder surgery on 12/30/21 ? ?SUBJECTIVE:                                                                                                                                                                                     ? ?  SUBJECTIVE STATEMENT: ?"I had pain in my biceps in my right shoulder last night. Patient states he has been having pain in his shoulder since last Thursday." ? ?PERTINENT HISTORY: ?S/P right shoulder arthroscopy with subacromial decompression ,distal clavicle excision ,biceps on 12/30/21 ? ?PAIN:  ?Are you having pain? Yes: NPRS scale: 2/10 ?Pain location: right anterior shoulder ?Pain description: sharp ?Relieving factors: ice ?Aggravating factors:  ? ?PRECAUTIONS: Shoulder ? ?WEIGHT BEARING RESTRICTIONS No ? ?FALLS:  ?Has patient fallen in last 6 months? No ? ?LIVING ENVIRONMENT: ?Lives with: lives with their family ?Lives in: House/apartment ?Stairs: Yes: Internal: 14 steps; none and External: 7 steps;  none ?Has following equipment at home: None ? 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 ?PLOF: Independent ? ?PATIENT GOALS "Pain free and full use of arm." ? ?OBJECTIVE:  ? ?DIAGNOSTIC FINDINGS:  ?MRI on 11/15/21 findings:  ?1. Infraspinatus tendinosis with partial intrasubstance and ?articular surface insertional tearing. No full-thickness rotator ?cuff tear, tendon retraction or muscular atrophy. ?2. Supraspinatus tendinosis without evidence of tear. The labrum and ?biceps tendon appear intact. ?3. Suspected old fracture of the distal right clavicle with moderate ?acromioclavicular degenerative changes. ? ?PATIENT SURVEYS:  ?FOTO 45, predicted 73 ? ?COGNITION: ? Overall cognitive status: Within functional limits for tasks assessed ?    ?SENSATION: ?WFL ? ?POSTURE: ?Rounded shoulders  ? ?UPPER EXTREMITY ROM:  ? ?Passive ROM Right ?01/03/2022 Left ?01/03/2022  ?Shoulder flexion 90 PROM 170 AROM  ?Shoulder extension    ?Shoulder abduction 95 PROM 170 AROM  ?Shoulder adduction    ?Shoulder internal rotation 67 PROM 70 AROM  ?Shoulder external rotation 46 PROM 90 AROM  ?Elbow flexion    ?Elbow extension    ?Wrist flexion    ?Wrist extension    ?Wrist ulnar deviation    ?Wrist radial deviation    ?Wrist pronation    ?Wrist supination    ?(Blank rows = not tested) ? ?UPPER EXTREMITY MMT: ? ?MMT Right ?01/03/2022 Left ?01/03/2022  ?Shoulder flexion    ?Shoulder extension    ?Shoulder abduction    ?Shoulder adduction    ?Shoulder internal rotation    ?Shoulder external rotation    ?Middle trapezius    ?Lower trapezius    ?Elbow flexion    ?Elbow extension    ?Wrist flexion    ?Wrist extension    ?Wrist ulnar deviation    ?Wrist radial deviation    ?Wrist pronation    ?Wrist supination    ?Grip strength (lbs)    ?(Blank rows = not tested) ? **deterred due to protocol ? ?JOINT MOBILITY TESTING:  ?Not tested ? ?PALPATION:  ?TTP over incision over anterior right shoulder and biceps ?  ?TODAY'S TREATMENT:  ?Educated  patient on findings in evaluation and HEP. ? ? ?PATIENT EDUCATION: ?Education details: Educated patient on findings in evaluation and HEP. ?Person educated: Patient ?Education method: Explanation, Demonstration, Tactile cues, Verbal cues, and Handouts ?Education comprehension: verbalized understanding ? ? ?HOME EXERCISE PROGRAM: ?Access Code: [email or web address] ?URL: [email or web address] ?Date: 01/03/2022 ?Prepared by: Edythe Lynn ? ?Exercises ?- Circular Shoulder Pendulum with Table Support  - 2 x daily - 7 x weekly - 15 reps ?- Flexion-Extension Shoulder Pendulum with Table Support  - 2 x daily - 7 x weekly - 15 reps ? ?ASSESSMENT: ? ?CLINICAL IMPRESSION: ?Patient is a 51 y.o. male who was seen today for physical therapy evaluation and treatment for s/p right shoulder arthroscopy on 12/30/21. He demonstrates abnormal rounded shoulder posture. Right shoulder PROM measurements were obtained  and determined PROM limitations due to guarding and increased pain. Patient reports incisions are healing with no signs of infection. Patient will benefit from skilled PT services to improve use of right shoulder following surgery without limitations.  ? ? ?OBJECTIVE IMPAIRMENTS decreased ROM, decreased strength, impaired UE functional use, and pain.  ? ?ACTIVITY LIMITATIONS cleaning, meal prep, occupation, and yard work.  ? ?PERSONAL FACTORS Time since onset of injury/illness/exacerbation and 1 comorbidity: hyperlipidemia  are also affecting patient's functional outcome.  ? ? ?REHAB POTENTIAL: Good ? ?CLINICAL DECISION MAKING: Stable/uncomplicated ? ?EVALUATION COMPLEXITY: Low ? ? ?GOALS: ? ?SHORT TERM GOALS: Target date: 01/17/2022 ? ?Patient will be independent with HEP for PT progression. ?Baseline: initial HEP addressed ?Goal status: INITIAL ? ? ?LONG TERM GOALS: Target date: 02/28/2022 ? ?Patient will score 56 on FOTO. ?Baseline: 45 ?Goal status: INITIAL ? ?2.  Patient will demonstrate </= 170 degrees right shoulder  flexion and abduction AROM to improve ability to reach for items overhead in kitchen cabinet.  ?Baseline: unable to perform ?Goal status: INITIAL ? ?3.  Patient will demonstrate 90 degrees right shoulder ER AROM to

## 2022-01-03 ENCOUNTER — Other Ambulatory Visit: Payer: Self-pay

## 2022-01-03 ENCOUNTER — Ambulatory Visit: Payer: 59 | Attending: Orthopaedic Surgery

## 2022-01-03 ENCOUNTER — Encounter (HOSPITAL_BASED_OUTPATIENT_CLINIC_OR_DEPARTMENT_OTHER): Payer: Self-pay | Admitting: Orthopaedic Surgery

## 2022-01-03 DIAGNOSIS — M6281 Muscle weakness (generalized): Secondary | ICD-10-CM | POA: Insufficient documentation

## 2022-01-03 DIAGNOSIS — R293 Abnormal posture: Secondary | ICD-10-CM | POA: Diagnosis not present

## 2022-01-03 DIAGNOSIS — M25511 Pain in right shoulder: Secondary | ICD-10-CM | POA: Insufficient documentation

## 2022-01-07 ENCOUNTER — Other Ambulatory Visit (HOSPITAL_COMMUNITY): Payer: Self-pay

## 2022-01-07 DIAGNOSIS — M19011 Primary osteoarthritis, right shoulder: Secondary | ICD-10-CM | POA: Diagnosis not present

## 2022-01-07 MED ORDER — DICLOFENAC SODIUM 75 MG PO TBEC
75.0000 mg | DELAYED_RELEASE_TABLET | Freq: Two times a day (BID) | ORAL | 0 refills | Status: DC
  Filled 2022-01-07 (×2): qty 60, 30d supply, fill #0

## 2022-01-08 ENCOUNTER — Encounter: Payer: Self-pay | Admitting: Physical Therapy

## 2022-01-08 ENCOUNTER — Ambulatory Visit: Payer: 59 | Admitting: Physical Therapy

## 2022-01-08 ENCOUNTER — Other Ambulatory Visit: Payer: Self-pay

## 2022-01-08 DIAGNOSIS — M25511 Pain in right shoulder: Secondary | ICD-10-CM | POA: Diagnosis not present

## 2022-01-08 DIAGNOSIS — R293 Abnormal posture: Secondary | ICD-10-CM | POA: Diagnosis not present

## 2022-01-08 DIAGNOSIS — M6281 Muscle weakness (generalized): Secondary | ICD-10-CM | POA: Diagnosis not present

## 2022-01-08 NOTE — Therapy (Signed)
?OUTPATIENT PHYSICAL THERAPY TREATMENT NOTE ? ? ?Patient Name: Larry Cruz ?MRN: [id number] ?DOB:July 28, 1971, 51 y.o., male ?Today's Date: 01/08/2022 ? ?PCP: Rita Ohara, MD ?REFERRING PROVIDER: Rita Ohara, MD ? ? ?Past Medical History:  ?Diagnosis Date  ? Aseptic meningitis   ? Colorblind   ? Dyslipidemia   ? Headache   ? Herpes genitalis in men   ? Hx of elbow surgery 09/02/2019  ? right elbow percutaneous ECRB release/lateral elbow percutaneous release-Dr.Gramig  ? Hyperlipidemia   ? Lateral epicondylitis of right elbow 03/2018  ? s/p injection by Dr. Micheline Chapman; MRI 10/2018, f/b eval by Dr. Amedeo Plenty  ? Low back pain   ? Meningitis 2019  ? negative cultures  ? Torn tendon   ? R elbow  ? ?Past Surgical History:  ?Procedure Laterality Date  ? [place] SURGERY  2009  ? Ronnald Ramp, MD; fusion  ? ELBOW SURGERY  09/02/2019  ? right elbow percutaneous ECRB release/lateral elbow percutaneous release-Dr. Amedeo Plenty  ? LATERAL EPICONDYLE RELEASE Right   ? percutaneous--done by Dr. Amedeo Plenty in either 08/2019 or 09/2019  ? [place] SURGERY  2000  ? CARTER, MD; L4-L5 (right)  ? [place] SURGERY  11/2013  ? L4-5, Dr. Ronnald Ramp (left)  ? SHOULDER ARTHROSCOPY Right 12/30/2021  ? Procedure: ARTHROSCOPY SHOULDER/DEBRIDEMENT;  Surgeon: Hiram Gash, MD;  Location: [place];  Service: Orthopedics;  Laterality: Right;  ? SHOULDER ARTHROSCOPY WITH DISTAL CLAVICLE RESECTION Right 12/30/2021  ? Procedure: SHOULDER ARTHROSCOPY WITH DISTAL CLAVICLE EXCISION;  Surgeon: Hiram Gash, MD;  Location: [place];  Service: Orthopedics;  Laterality: Right;  ? SHOULDER ARTHROSCOPY WITH SUBACROMIAL DECOMPRESSION, ROTATOR CUFF REPAIR AND BICEP TENDON REPAIR Right 12/30/2021  ? Procedure: SHOULDER ARTHROSCOPY WITH SUBACROMIAL DECOMPRESSION, ROTATOR CUFF REPAIR AND BICEP TENDON REPAIR;  Surgeon: Hiram Gash, MD;  Location: [place];  Service: Orthopedics;  Laterality: Right;  ? VASECTOMY  11/09  ? ?Patient  Active Problem List  ? Diagnosis Date Noted  ? Mixed hyperlipidemia 12/21/2020  ? Intractable chronic migraine without aura and without status migrainosus 10/14/2018  ? Intractable headache 07/17/2018  ? Aseptic meningitis 04/23/2018  ? Left knee pain 12/08/2016  ? Plantar fasciitis of left foot 12/29/2015  ? Cavus deformity of foot 12/29/2015  ? Pure hypercholesterolemia 06/23/2011  ? ? ?REFERRING PROVIDER: Hiram Gash, MD ?  ?REFERRING DIAG: S/P right shoulder arthroscopy with subacromial decompression, distal clavicle excision, biceps ? ?THERAPY DIAG:  ?Right shoulder pain, unspecified chronicity ? ?Abnormal posture ? ?Muscle weakness (generalized) ? ?PERTINENT HISTORY: S/P right shoulder arthroscopy rotator cuff repiar with subacromial decompression, distal clavicle excision, biceps on 12/30/21 ? ?PRECAUTIONS: PROM right shoulder and elbow ? ?SUBJECTIVE: Patient reports he is doing well. Pendulum exercises are uncomfortable.  ? ?PAIN:  ?Are you having pain? Yes:  ?NPRS scale: 2/10 ?Pain location: Right shoulder ?Pain description: [place] Ar[place] ?Relieving factors: Ice, medication ?Aggravating Factors: Shoulder movement ? ?PATIENT GOALS "Pain free and full use of arm." ? ? ?OBJECTIVE: (objective measures completed at initial evaluation unless otherwise dated) ?PATIENT SURVEYS:  ?FOTO 45, predicted 36 ?  ?POSTURE: ?Rounded shoulders  ?  ?UPPER EXTREMITY ROM:  ?  ?Passive ROM Right ?01/03/2022 Left ?01/03/2022 Right ?01/08/2021  ?Shoulder flexion 90 PROM 170 AROM 90 PROM  ?Shoulder extension       ?Shoulder abduction 95 PROM 170 AROM   ?Shoulder adduction       ?Shoulder internal rotation 67 PROM 70 AROM   ?Shoulder external rotation 46  PROM 90 AROM   ?Elbow flexion       ?Elbow extension       ?Wrist flexion       ?Wrist extension       ?Wrist ulnar deviation       ?Wrist radial deviation       ?Wrist pronation       ?Wrist supination       ?(Blank rows = not tested) ?  ?UPPER EXTREMITY MMT: ?  ?MMT Right ?01/03/2022  Left ?01/03/2022  ?Shoulder flexion      ?Shoulder extension      ?Shoulder abduction      ?Shoulder adduction      ?Shoulder internal rotation      ?Shoulder external rotation      ?Middle trapezius      ?Lower trapezius      ?Elbow flexion      ?Elbow extension      ?Wrist flexion      ?Wrist extension      ?Wrist ulnar deviation      ?Wrist radial deviation      ?Wrist pronation      ?Wrist supination      ?Grip strength (lbs)      ?(Blank rows = not tested) ?            **deterred due to protocol ?  ?PALPATION:  ?TTP over incision over anterior right shoulder and biceps ?            ? ?TODAY'S TREATMENT:  ?[facility] Adult PT Treatment:                                                DATE: 01/08/2022 ?Therapeutic Exercise: ?Supine hand clasped shoulder flexion PROM with elbows bent x 10 ?Seated shoulder flexion table slide x 10 ?Standing shoulder flexion step back stretch x 10 ?Manual Therapy: ?PROM right shoulder flexion in scapular plane and ER at 0 and 45 deg in scapular plane ?PROM right elbow flexion and extension ?   ? ?PATIENT EDUCATION: ?Education details: Emphasized shoulder and elbow precautions, HEP update ?Person educated: Patient ?Education method: Explanation, Demonstration, Tactile cues, Verbal cues, and Handouts ?Education comprehension: verbalized understanding ?  ?HOME EXERCISE PROGRAM: ?Access Code: [email or web address] ?  ? ?ASSESSMENT: ?CLINICAL IMPRESSION: ?Patient tolerated therapy well with no adverse effects. Therapy focused primarily on PROM for right shoulder and elbow, and updated HEP to provided exercises to maintain shoulder PROM. He demonstrates good ROM of the right shoulder and elbow, meeting expected milestones for motion so was encouraged to continue PROM at home to maintain current motion, but to avoid excessive stretching past current level to allow RTC repair to continue healing and avoid inflammation. Patient would benefit from continued skilled PT to progress as protocol allows in order to  maximize functional ability.  ?  ?  ?OBJECTIVE IMPAIRMENTS decreased ROM, decreased strength, impaired UE functional use, and pain.  ?  ?ACTIVITY LIMITATIONS cleaning, meal prep, occupation, and yard work.  ?  ?PERSONAL FACTORS Time since onset of injury/illness/exacerbation and 1 comorbidity: hyperlipidemia  are also affecting patient's functional outcome.  ?  ?  ?GOALS: ?SHORT TERM GOALS: Target date: 01/17/2022 ?  ?Patient will be independent with HEP for PT progression. ?Baseline: initial HEP addressed ?Goal status: INITIAL ?  ?  ?LONG TERM GOALS: Target date: 02/28/2022 ?  ?  Patient will score 56 on FOTO. ?Baseline: 45 ?Goal status: INITIAL ?  ?2.  Patient will demonstrate </= 170 degrees right shoulder flexion and abduction AROM to improve ability to reach for items overhead in kitchen cabinet.  ?Baseline: unable to perform ?Goal status: INITIAL ?  ?3.  Patient will demonstrate 90 degrees right shoulder ER AROM to improve ability to reach [facility] without limitations. ?Baseline: unable to perform ?Goal status: INITIAL ?  ?4.  Patient will demonstrate improved erect posture to assist with reaching [place] without limitations.  ?Baseline: rounded shoulders ?Goal status: INITIAL ?  ?5.  Patient will demonstrate >/= 4/5 right shoulder MMT to perform ADLs at home.  ?Baseline: unable to assess due to surgical protocol ?Goal status: INITIAL ?  ? ?PLAN: ?PT FREQUENCY: 1-2x/week ?  ?PT DURATION: 8 weeks ?  ?PLANNED INTERVENTIONS: Therapeutic exercises, Therapeutic activity, Neuromuscular re-education, Patient/Family education, Joint mobilization, Dry Needling, Electrical stimulation, Cryotherapy, Moist heat, scar mobilization, Taping, and Manual therapy ?  ?PLAN FOR NEXT SESSION: PROM for shoulder flexion/external rotation and elbow ? ? ? ?Hilda Blades, PT, DPT, LAT, ATC ?01/08/22  10:09 AM ?Phone: [phone] ?Fax: [phone] ? ? ?  ? ?

## 2022-01-08 NOTE — Patient Instructions (Signed)
Access Code: [email or web address] ?URL: [email or web address] ?Date: 01/08/2022 ?Prepared by: Hilda Blades ? ?Exercises ?- Supine Shoulder Flexion AAROM with Hands Clasped  - 3 x daily - 10 reps - 10 seconds hold ?- Seated Shoulder Flexion Towel Slide at Table Top  - 3 x daily - 3 sets - 10 reps - 10 seconds hold ?- Standing 'L' Stretch at Counter  - 3 x daily - 10 reps - 10 seconds hold ?- Elbow Flexion PROM  - 3 x daily - 10 reps - 10 seconds hold ?

## 2022-01-12 ENCOUNTER — Encounter: Payer: 59 | Admitting: Physical Therapy

## 2022-01-17 ENCOUNTER — Ambulatory Visit: Payer: 59 | Attending: Orthopaedic Surgery | Admitting: Physical Therapy

## 2022-01-17 ENCOUNTER — Encounter: Payer: Self-pay | Admitting: Physical Therapy

## 2022-01-17 DIAGNOSIS — M25511 Pain in right shoulder: Secondary | ICD-10-CM | POA: Insufficient documentation

## 2022-01-17 DIAGNOSIS — M6281 Muscle weakness (generalized): Secondary | ICD-10-CM | POA: Insufficient documentation

## 2022-01-17 DIAGNOSIS — R293 Abnormal posture: Secondary | ICD-10-CM | POA: Diagnosis not present

## 2022-01-17 NOTE — Therapy (Signed)
OUTPATIENT PHYSICAL THERAPY TREATMENT NOTE   Patient Name: Larry Cruz MRN: [id number] DOB:1971/07/04, 51 y.o., male Today's Date: 01/17/2022  PCP: Joselyn Arrow, MD REFERRING PROVIDER: Bjorn Pippin, MD   PT End of Session - 01/17/22 1225     Visit Number 3    Number of Visits 17    Date for PT Re-Evaluation 02/28/22    Authorization Type [facility]    PT Start Time 1230    PT Stop Time 1323    PT Time Calculation (min) 53 min              Past Medical History:  Diagnosis Date   Aseptic meningitis    Colorblind    Dyslipidemia    Headache    Herpes genitalis in men    Hx of elbow surgery 09/02/2019   right elbow percutaneous ECRB release/lateral elbow percutaneous release-Dr.Gramig   Hyperlipidemia    Lateral epicondylitis of right elbow 03/2018   s/p injection by Dr. Margaretha Sheffield; MRI 10/2018, f/b eval by Dr. Amanda Pea   Low back pain    Meningitis 2019   negative cultures   Torn tendon    R elbow   Past Surgical History:  Procedure Laterality Date   CERVICAL DISC SURGERY  2009   JONES, MD; fusion   ELBOW SURGERY  09/02/2019   right elbow percutaneous ECRB release/lateral elbow percutaneous release-Dr. Amanda Pea   LATERAL EPICONDYLE RELEASE Right    percutaneous--done by Dr. Amanda Pea in either 08/2019 or 09/2019   LUMBAR DISC SURGERY  2000   CARTER, MD; L4-L5 (right)   LUMBAR DISC SURGERY  11/2013   L4-5, Dr. Yetta Barre (left)   SHOULDER ARTHROSCOPY Right 12/30/2021   Procedure: ARTHROSCOPY SHOULDER/DEBRIDEMENT;  Surgeon: Bjorn Pippin, MD;  Location: [facility];  Service: Orthopedics;  Laterality: Right;   SHOULDER ARTHROSCOPY WITH DISTAL CLAVICLE RESECTION Right 12/30/2021   Procedure: SHOULDER ARTHROSCOPY WITH DISTAL CLAVICLE EXCISION;  Surgeon: Bjorn Pippin, MD;  Location: [facility];  Service: Orthopedics;  Laterality: Right;   SHOULDER ARTHROSCOPY WITH SUBACROMIAL DECOMPRESSION, ROTATOR CUFF REPAIR AND BICEP TENDON REPAIR Right 12/30/2021    Procedure: SHOULDER ARTHROSCOPY WITH SUBACROMIAL DECOMPRESSION, ROTATOR CUFF REPAIR AND BICEP TENDON REPAIR;  Surgeon: Bjorn Pippin, MD;  Location: [facility];  Service: Orthopedics;  Laterality: Right;   VASECTOMY  11/09   Patient Active Problem List   Diagnosis Date Noted   Mixed hyperlipidemia 12/21/2020   Intractable chronic migraine without aura and without status migrainosus 10/14/2018   Intractable headache 07/17/2018   Aseptic meningitis 04/23/2018   Left knee pain 12/08/2016   Plantar fasciitis of left foot 12/29/2015   Cavus deformity of foot 12/29/2015   Pure hypercholesterolemia 06/23/2011    REFERRING PROVIDER: Bjorn Pippin, MD   REFERRING DIAG: S/P right shoulder arthroscopy with subacromial decompression, distal clavicle excision, biceps  THERAPY DIAG:  Right shoulder pain, unspecified chronicity  Abnormal posture  Muscle weakness (generalized)  PERTINENT HISTORY: S/P right shoulder arthroscopy rotator cuff repiar with subacromial decompression, distal clavicle excision, biceps on 12/30/21  PRECAUTIONS: PROM right shoulder and elbow  SUBJECTIVE: Patient reports compliance with HEP and restrictions. He is having some difficulty relaxing his arm in the sling.   PAIN:  Are you having pain? Yes:  NPRS scale: 4/10,  Pain location: Right shoulder Pain description: Sharp Relieving factors: Ice, medication Aggravating Factors: Shoulder movement  PATIENT GOALS "Pain free and full use of arm."   OBJECTIVE: (objective measures  completed at initial evaluation unless otherwise dated) PATIENT SURVEYS:  FOTO 45, predicted 56   POSTURE: Rounded shoulders    UPPER EXTREMITY ROM:    Passive ROM Right 01/03/2022 Left 01/03/2022 Right 01/08/2021 Right  01/17/22  Shoulder flexion 90 PROM 170 AROM 90 PROM 95 PROM 110 PROM at table   Shoulder extension        Shoulder abduction 95 PROM 170 AROM  60 Per protocol   Shoulder adduction        Shoulder  internal rotation 67 PROM 70 AROM    Shoulder external rotation 46 PROM 90 AROM  20 PROM  Elbow flexion        Elbow extension        Wrist flexion        Wrist extension        Wrist ulnar deviation        Wrist radial deviation        Wrist pronation        Wrist supination        (Blank rows = not tested)   UPPER EXTREMITY MMT:   MMT Right 01/03/2022 Left 01/03/2022  Shoulder flexion      Shoulder extension      Shoulder abduction      Shoulder adduction      Shoulder internal rotation      Shoulder external rotation      Middle trapezius      Lower trapezius      Elbow flexion      Elbow extension      Wrist flexion      Wrist extension      Wrist ulnar deviation      Wrist radial deviation      Wrist pronation      Wrist supination      Grip strength (lbs)      (Blank rows = not tested)             **deterred due to protocol   PALPATION:  TTP over incision over anterior right shoulder and biceps              TODAY'S TREATMENT:  [facility] Adult PT Treatment:                                                DATE: 01/17/2022 Therapeutic Exercise: ER PROM using wooden dowel 10 sec x 10 Supine hand clasped shoulder flexion PROM with elbows bent x 10 Seated shoulder flexion table slide x 10 Standing shoulder flexion step back stretch x 10  Manual Therapy: PROM right shoulder flexion, abduction in scapular plane and ER at 0 and 45 deg in scapular plane Soft tissue mobilization right pectoralis, right upper trap Modalities:  Cryotherapy Right shoulder Supine x 15 minutes   [facility] Adult PT Treatment:                                                DATE: 01/08/2022 Therapeutic Exercise: Supine hand clasped shoulder flexion PROM with elbows bent x 10 Seated shoulder flexion table slide x 10 Standing shoulder flexion step back stretch x 10 Manual Therapy: PROM right shoulder flexion in scapular plane and ER at 0 and 45  deg in scapular plane PROM right elbow flexion and  extension     PATIENT EDUCATION: Education details:HEP update Person educated: Patient Education method: Explanation, Demonstration, Tactile cues, Verbal cues, and Handouts Education comprehension: verbalized understanding   HOME EXERCISE PROGRAM: Access Code: [email or web address] URL: [email or web address] Date: 01/17/2022 Prepared by: Jannette Spanner  Exercises - Supine Shoulder Flexion AAROM with Hands Clasped  - 3 x daily - 10 reps - 10 seconds hold - Seated Shoulder Flexion Towel Slide at Table Top  - 3 x daily - 3 sets - 10 reps - 10 seconds hold - Standing 'L' Stretch at [employer]  - 3 x daily - 10 reps - 10 seconds hold - Elbow Flexion PROM  - 3 x daily - 10 reps - 10 seconds hold - Supine Shoulder External Rotation with Dowel  - 1 x daily - 7 x weekly - 1 sets - 5-10 reps - 10 hold    ASSESSMENT: CLINICAL IMPRESSION: Patient tolerated therapy well with no adverse effects. ER PROM decreased compared to last session. Therapy focused primarily on PROM for right shoulder and updated HEP to provided exercises to maintain shoulder ER PROM. Pain increased after session. Ice pack placed on shoulder at end of session to reduce pain.  Patient would benefit from continued skilled PT to progress as protocol allows in order to maximize functional ability.      OBJECTIVE IMPAIRMENTS decreased ROM, decreased strength, impaired UE functional use, and pain.    ACTIVITY LIMITATIONS cleaning, meal prep, occupation, and yard work.    PERSONAL FACTORS Time since onset of injury/illness/exacerbation and 1 comorbidity: hyperlipidemia  are also affecting patient's functional outcome.      GOALS: SHORT TERM GOALS: Target date: 01/17/2022   Patient will be independent with HEP for PT progression. Baseline: initial HEP addressed Status 01/17/22: independent with initial HEP Goal status: MET     LONG TERM GOALS: Target date: 02/28/2022   Patient will score 56 on FOTO. Baseline: 45 Goal status:  INITIAL   2.  Patient will demonstrate </= 170 degrees right shoulder flexion and abduction AROM to improve ability to reach for items overhead in kitchen cabinet.  Baseline: unable to perform Goal status: INITIAL   3.  Patient will demonstrate 90 degrees right shoulder ER AROM to improve ability to reach [facility] without limitations. Baseline: unable to perform Goal status: INITIAL   4.  Patient will demonstrate improved erect posture to assist with reaching [place] without limitations.  Baseline: rounded shoulders Goal status: INITIAL   5.  Patient will demonstrate >/= 4/5 right shoulder MMT to perform ADLs at home.  Baseline: unable to assess due to surgical protocol Goal status: INITIAL    PLAN: PT FREQUENCY: 1-2x/week   PT DURATION: 8 weeks   PLANNED INTERVENTIONS: Therapeutic exercises, Therapeutic activity, Neuromuscular re-education, Patient/Family education, Joint mobilization, Dry Needling, Electrical stimulation, Cryotherapy, Moist heat, scar mobilization, Taping, and Manual therapy   PLAN FOR NEXT SESSION: PROM for shoulder flexion/external rotation and elbow, ice pack post session  (Vaso not in POC)     Jannette Spanner, PTA 01/17/22 2:02 PM Phone: [phone] Fax: [phone]

## 2022-01-26 ENCOUNTER — Encounter: Payer: Self-pay | Admitting: Physical Therapy

## 2022-01-26 ENCOUNTER — Ambulatory Visit: Payer: 59 | Admitting: Physical Therapy

## 2022-01-26 DIAGNOSIS — M6281 Muscle weakness (generalized): Secondary | ICD-10-CM | POA: Diagnosis not present

## 2022-01-26 DIAGNOSIS — R293 Abnormal posture: Secondary | ICD-10-CM

## 2022-01-26 DIAGNOSIS — M25511 Pain in right shoulder: Secondary | ICD-10-CM

## 2022-01-26 NOTE — Therapy (Signed)
?OUTPATIENT PHYSICAL THERAPY TREATMENT NOTE ? ? ?Patient Name: Larry Cruz ?MRN: [id number] ?DOB:Jan 14, 1971, 51 y.o., male ?Today's Date: 01/26/2022 ? ?PCP: Rita Ohara, MD ?REFERRING PROVIDER: Rita Ohara, MD ? ? PT End of Session - 01/26/22 1317   ? ? Visit Number 4   ? Number of Visits 17   ? Date for PT Re-Evaluation 02/28/22   ? Authorization Type [facility]   ? Progress Note Due on Visit 10   ? PT Start Time 1315   ? PT Stop Time 1405   ? PT Time Calculation (min) 50 min   ? ?  ?  ? ?  ?  ? ?Past Medical History:  ?Diagnosis Date  ? Aseptic meningitis   ? Colorblind   ? Dyslipidemia   ? Headache   ? Herpes genitalis in men   ? Hx of elbow surgery 09/02/2019  ? right elbow percutaneous ECRB release/lateral elbow percutaneous release-Dr.Gramig  ? Hyperlipidemia   ? Lateral epicondylitis of right elbow 03/2018  ? s/p injection by Dr. Micheline Chapman; MRI 10/2018, f/b eval by Dr. Amedeo Plenty  ? Low back pain   ? Meningitis 2019  ? negative cultures  ? Torn tendon   ? R elbow  ? ?Past Surgical History:  ?Procedure Laterality Date  ? [place] SURGERY  2009  ? Ronnald Ramp, MD; fusion  ? ELBOW SURGERY  09/02/2019  ? right elbow percutaneous ECRB release/lateral elbow percutaneous release-Dr. Amedeo Plenty  ? LATERAL EPICONDYLE RELEASE Right   ? percutaneous--done by Dr. Amedeo Plenty in either 08/2019 or 09/2019  ? [place] SURGERY  2000  ? CARTER, MD; L4-L5 (right)  ? [place] SURGERY  11/2013  ? L4-5, Dr. Ronnald Ramp (left)  ? SHOULDER ARTHROSCOPY Right 12/30/2021  ? Procedure: ARTHROSCOPY SHOULDER/DEBRIDEMENT;  Surgeon: Hiram Gash, MD;  Location: [place];  Service: Orthopedics;  Laterality: Right;  ? SHOULDER ARTHROSCOPY WITH DISTAL CLAVICLE RESECTION Right 12/30/2021  ? Procedure: SHOULDER ARTHROSCOPY WITH DISTAL CLAVICLE EXCISION;  Surgeon: Hiram Gash, MD;  Location: [place];  Service: Orthopedics;  Laterality: Right;  ? SHOULDER ARTHROSCOPY WITH SUBACROMIAL DECOMPRESSION, ROTATOR CUFF REPAIR AND  BICEP TENDON REPAIR Right 12/30/2021  ? Procedure: SHOULDER ARTHROSCOPY WITH SUBACROMIAL DECOMPRESSION, ROTATOR CUFF REPAIR AND BICEP TENDON REPAIR;  Surgeon: Hiram Gash, MD;  Location: [place];  Service: Orthopedics;  Laterality: Right;  ? VASECTOMY  11/09  ? ?Patient Active Problem List  ? Diagnosis Date Noted  ? Mixed hyperlipidemia 12/21/2020  ? Intractable chronic migraine without aura and without status migrainosus 10/14/2018  ? Intractable headache 07/17/2018  ? Aseptic meningitis 04/23/2018  ? Left knee pain 12/08/2016  ? Plantar fasciitis of left foot 12/29/2015  ? Cavus deformity of foot 12/29/2015  ? Pure hypercholesterolemia 06/23/2011  ? ? ?REFERRING PROVIDER: Hiram Gash, MD ?  ?REFERRING DIAG: S/P right shoulder arthroscopy with subacromial decompression, distal clavicle excision, biceps ? ?THERAPY DIAG:  ?Right shoulder pain, unspecified chronicity ? ?Abnormal posture ? ?Muscle weakness (generalized) ? ?PERTINENT HISTORY: S/P right shoulder arthroscopy rotator cuff repiar with subacromial decompression, distal clavicle excision, biceps on 12/30/21 ? ?PRECAUTIONS: PROM right shoulder and elbow ? ?SUBJECTIVE: Patient reports increased pain over the last couple of days. Difficulty getting shoulder comfortable last night.   ? ?PAIN:  ?Are you having pain? Yes:  ?NPRS scale: 4/10,  ?Pain location: Right shoulder ?Pain description: Hervey Ard ?Relieving factors: Ice, medication ?Aggravating Factors: Shoulder movement ? ?PATIENT GOALS "Pain free and full use  of arm." ? ? ?OBJECTIVE: (objective measures completed at initial evaluation unless otherwise dated) ?PATIENT SURVEYS:  ?FOTO 45, predicted 64 ?  ?POSTURE: ?Rounded shoulders  ?  ?UPPER EXTREMITY ROM:  ?  ?Passive ROM Right ?01/03/2022 Left ?01/03/2022 Right ?01/08/2021 Right  ?01/17/22 Right ?01/26/22  ?Shoulder flexion 90 PROM 170 AROM 90 PROM 95 PROM ?110 PROM at table  95 PROM supine  ?Shoulder extension         ?Shoulder abduction 95  PROM 170 AROM  60 Per protocol    ?Shoulder adduction         ?Shoulder internal rotation 67 PROM 70 AROM     ?Shoulder external rotation 46 PROM 90 AROM  20 PROM 12 PROM at neutral  ?Elbow flexion         ?Elbow extension         ?Wrist flexion         ?Wrist extension         ?Wrist ulnar deviation         ?Wrist radial deviation         ?Wrist pronation         ?Wrist supination         ?(Blank rows = not tested) ?  ?UPPER EXTREMITY MMT: ?  ?MMT Right ?01/03/2022 Left ?01/03/2022  ?Shoulder flexion      ?Shoulder extension      ?Shoulder abduction      ?Shoulder adduction      ?Shoulder internal rotation      ?Shoulder external rotation      ?Middle trapezius      ?Lower trapezius      ?Elbow flexion      ?Elbow extension      ?Wrist flexion      ?Wrist extension      ?Wrist ulnar deviation      ?Wrist radial deviation      ?Wrist pronation      ?Wrist supination      ?Grip strength (lbs)      ?(Blank rows = not tested) ?            **deterred due to protocol ?  ?PALPATION:  ?TTP over incision over anterior right shoulder and biceps ?            ? ?TODAY'S TREATMENT:  ?[facility] Adult PT Treatment:                                                DATE: 01/26/2022 ?Therapeutic Exercise: ?Standing shoulder flexion step back stretch x 10 ?ER PROM using wooden dowel 30 sec x 5  ?Supine hand clasped shoulder flexion PROM with elbows bent x 10 ? ?Manual Therapy: ?Gentle oscillations to GHJ for pain reduction, grade 1,2 A/P and inferior glides  ?PROM right shoulder flexion, abduction in scapular plane to 60 and ER at 0 - see chart for measurements ?Modalities:  ?Cryotherapy Right shoulder Supine x 15 minutes  ?IFC 15 mA concurrent with Cryotherapy , right shoulder  ?Self Care:  ?Consider increased frequency of ice applications/ consider TENS purchase for pain management if IFC helpful in clinic.  ? ?[facility] Adult PT Treatment:  DATE: 01/17/2022 ?Therapeutic Exercise: ?ER PROM using wooden  dowel 10 sec x 10 ?Supine hand clasped shoulder flexion PROM with elbows bent x 10 ?Seated shoulder flexion table slide x 10 ?Standing shoulder flexion step back stretch x 10 ? ?Manual Therapy: ?PROM right shoulder flexion, abduction in scapular plane and ER at 0  ?Soft tissue mobilization right pectoralis, right upper trap ?Modalities:  ?Cryotherapy Right shoulder Supine x 15 minutes  ? ?[facility] Adult PT Treatment:                                                DATE: 01/08/2022 ?Therapeutic Exercise: ?Supine hand clasped shoulder flexion PROM with elbows bent x 10 ?Seated shoulder flexion table slide x 10 ?Standing shoulder flexion step back stretch x 10 ?Manual Therapy: ?PROM right shoulder flexion in scapular plane and ER at 0 and 45 deg in scapular plane ?PROM right elbow flexion and extension ?   ? ?PATIENT EDUCATION: ?Education details:TENS and Ice for pain management ?Person educated: Patient ?Education method: Explanation, Demonstration, Tactile cues, Verbal cues, and Handouts ?Education comprehension: verbalized understanding ?  ?HOME EXERCISE PROGRAM: ?Access Code: [email or web address] ?URL: [email or web address] ?Date: 01/17/2022 ?Prepared by: Hessie Diener ? ?Exercises ?- Supine Shoulder Flexion AAROM with Hands Clasped  - 3 x daily - 10 reps - 10 seconds hold ?- Seated Shoulder Flexion Towel Slide at Table Top  - 3 x daily - 3 sets - 10 reps - 10 seconds hold ?- Standing 'L' Stretch at Counter  - 3 x daily - 10 reps - 10 seconds hold ?- Elbow Flexion PROM  - 3 x daily - 10 reps - 10 seconds hold ?- Supine Shoulder External Rotation with Dowel  - 1 x daily - 7 x weekly - 1 sets - 5-10 reps - 10 hold ?  ? ?ASSESSMENT: ?CLINICAL IMPRESSION: ?Patient tolerated therapy well however with increased pain. He was sore after last session. Today he reports is not a good day due to increased pain and difficulty getting his arm comfortable to sleep last night. ER PROM decreased compared to last session. Therapy  focused primarily on PROM and manual for right shoulder.  Pain increased after session. Ice pack placed on shoulder at end of session to reduce pain and also trial of electrical stimulation to see if patient mi

## 2022-02-01 NOTE — Therapy (Signed)
?OUTPATIENT PHYSICAL THERAPY TREATMENT NOTE ? ? ?Patient Name: Larry Cruz ?MRN: [id number] ?DOB:[birth year], 51 y.o., male ?Today's Date: 02/02/2022 ? ?PCP: Rita Ohara, MD ?REFERRING PROVIDER: Hiram Gash, MD ? ? PT End of Session - 02/02/22 1125   ? ? Visit Number 5   ? Number of Visits 17   ? Date for PT Re-Evaluation 02/28/22   ? Authorization Type [employer]   ? Progress Note Due on Visit 10   ? PT Start Time 1130   ? PT Stop Time 7829   ? PT Time Calculation (min) 45 min   ? Activity Tolerance Patient tolerated treatment well   ? Behavior During Therapy [facility] for tasks assessed/performed   ? ?  ?  ? ?  ? ?  ? ?Past Medical History:  ?Diagnosis Date  ? Aseptic meningitis   ? Colorblind   ? Dyslipidemia   ? Headache   ? Herpes genitalis in men   ? Hx of elbow surgery 09/02/2019  ? right elbow percutaneous ECRB release/lateral elbow percutaneous release-Dr.Gramig  ? Hyperlipidemia   ? Lateral epicondylitis of right elbow 03/2018  ? s/p injection by Dr. Micheline Chapman; MRI 10/2018, f/b eval by Dr. Amedeo Plenty  ? Low back pain   ? Meningitis 2019  ? negative cultures  ? Torn tendon   ? R elbow  ? ?Past Surgical History:  ?Procedure Laterality Date  ? [place] SURGERY  2009  ? Ronnald Ramp, MD; fusion  ? ELBOW SURGERY  09/02/2019  ? right elbow percutaneous ECRB release/lateral elbow percutaneous release-Dr. Amedeo Plenty  ? LATERAL EPICONDYLE RELEASE Right   ? percutaneous--done by Dr. Amedeo Plenty in either 08/2019 or 09/2019  ? [place] SURGERY  2000  ? CARTER, MD; L4-L5 (right)  ? [place] SURGERY  11/2013  ? L4-5, Dr. Ronnald Ramp (left)  ? SHOULDER ARTHROSCOPY Right 12/30/2021  ? Procedure: ARTHROSCOPY SHOULDER/DEBRIDEMENT;  Surgeon: Hiram Gash, MD;  Location: [place];  Service: Orthopedics;  Laterality: Right;  ? SHOULDER ARTHROSCOPY WITH DISTAL CLAVICLE RESECTION Right 12/30/2021  ? Procedure: SHOULDER ARTHROSCOPY WITH DISTAL CLAVICLE EXCISION;  Surgeon: Hiram Gash, MD;  Location: [place];   Service: Orthopedics;  Laterality: Right;  ? SHOULDER ARTHROSCOPY WITH SUBACROMIAL DECOMPRESSION, ROTATOR CUFF REPAIR AND BICEP TENDON REPAIR Right 12/30/2021  ? Procedure: SHOULDER ARTHROSCOPY WITH SUBACROMIAL DECOMPRESSION, ROTATOR CUFF REPAIR AND BICEP TENDON REPAIR;  Surgeon: Hiram Gash, MD;  Location: [place];  Service: Orthopedics;  Laterality: Right;  ? VASECTOMY  11/09  ? ?Patient Active Problem List  ? Diagnosis Date Noted  ? Mixed hyperlipidemia 12/21/2020  ? Intractable chronic migraine without aura and without status migrainosus 10/14/2018  ? Intractable headache 07/17/2018  ? Aseptic meningitis 04/23/2018  ? Left knee pain 12/08/2016  ? Plantar fasciitis of left foot 12/29/2015  ? Cavus deformity of foot 12/29/2015  ? Pure hypercholesterolemia 06/23/2011  ? ? ?REFERRING PROVIDER: Hiram Gash, MD ?  ?REFERRING DIAG: S/P right shoulder arthroscopy with subacromial decompression, distal clavicle excision, biceps ? ?THERAPY DIAG:  ?Right shoulder pain, unspecified chronicity ? ?Abnormal posture ? ?Muscle weakness (generalized) ? ?PERTINENT HISTORY: S/P right shoulder arthroscopy rotator cuff repiar with subacromial decompression, distal clavicle excision, biceps on 12/30/21 ? ?PRECAUTIONS: PROM right shoulder and elbow ? ?SUBJECTIVE: Patient is 4 weeks and 5 days post right rotator cuff repair. He reports pain mainly at night when trying to sleep or when laying down trying to go to bed. States exercises  are going ok, he notes stiffness and shrug with PROM over 90 deg. ? ?PAIN:  ?Are you having pain? Yes:  ?NPRS scale: 3/10  ?Pain location: Right shoulder ?Pain description: Hervey Ard ?Relieving factors: Ice, medication ?Aggravating Factors: Shoulder movement ? ?PATIENT GOALS "Pain free and full use of arm." ? ? ?OBJECTIVE: (objective measures completed at initial evaluation unless otherwise dated) ?PATIENT SURVEYS:  ?FOTO 45, predicted 7 ?  ?POSTURE: ?Rounded shoulders  ?  ?UPPER EXTREMITY  ROM:  ?  ?Passive ROM Right ?01/03/2022 Left ?01/03/2022 Right  ?01/17/22 Right ?01/26/22 Right ?02/02/2022  ?Shoulder flexion 90 PROM 170 AROM 95 PROM ?110 PROM at table  95 PROM supine 100 deg ?PROM  ?Shoulder extension         ?Shoulder abduction 95 PROM 170 AROM 60 Per protocol   60 deg ?PROM  ?Shoulder adduction         ?Shoulder internal rotation 67 PROM 70 AROM     ?Shoulder external rotation 46 PROM 90 AROM 20 PROM 12 PROM at neutral 35-40 deg @ 45 deg scapular plane ?PROM  ?Elbow flexion       WFL  ?Elbow extension       WFL  ?(Blank rows = not tested) ?  ?UPPER EXTREMITY MMT: ?  ?MMT Right ?01/03/2022 Left ?01/03/2022  ?Shoulder flexion      ?Shoulder extension      ?Shoulder abduction      ?Shoulder adduction      ?Shoulder internal rotation      ?Shoulder external rotation      ?Middle trapezius      ?Lower trapezius      ?Elbow flexion      ?Elbow extension      ?Wrist flexion      ?Wrist extension      ?Wrist ulnar deviation      ?Wrist radial deviation      ?Wrist pronation      ?Wrist supination      ?Grip strength (lbs)      ?(Blank rows = not tested) ?            **deterred due to protocol ?  ?PALPATION:  ?TTP over incision over anterior right shoulder and biceps ?            ? ?TODAY'S TREATMENT:  ?[facility] Adult PT Treatment:                                                DATE: 02/02/2022 ?Therapeutic Exercise ?Standing shoulder flexion step back stretch x 10 ?Manual Therapy: ?PROM right shoulder flexion in scapular plane and ER at 0 and 45 deg in scapular plane ?PROM right elbow flexion and extension ?Vasopneumatic (Game Ready): (not billed) ?Location: Right shoulder ?Time: 15 min ?Temperature: 34 deg ?Pressure: Medium ? ? ?[facility] Adult PT Treatment:                                                DATE: 01/26/2022 ?Therapeutic Exercise: ?Standing shoulder flexion step back stretch x 10 ?ER PROM using wooden dowel 30 sec x 5  ?Supine hand clasped shoulder flexion PROM with elbows bent x 10 ?Manual  Therapy: ?Gentle oscillations to GHJ for pain reduction, grade  1,2 A/P and inferior glides  ?PROM right shoulder flexion, abduction in scapular plane to 60 and ER at 0 - see chart for measurements ?Modalities:  ?Cryotherapy Right shoulder Supine x 15 minutes  ?IFC 15 mA concurrent with Cryotherapy , right shoulder  ?Self Care:  ?Consider increased frequency of ice applications/ consider TENS purchase for pain management if IFC helpful in clinic.  ? ?[facility] Adult PT Treatment:                                                DATE: 01/17/2022 ?Therapeutic Exercise: ?ER PROM using wooden dowel 10 sec x 10 ?Supine hand clasped shoulder flexion PROM with elbows bent x 10 ?Seated shoulder flexion table slide x 10 ?Standing shoulder flexion step back stretch x 10 ? ?Manual Therapy: ?PROM right shoulder flexion, abduction in scapular plane and ER at 0  ?Soft tissue mobilization right pectoralis, right upper trap ?Modalities:  ?Cryotherapy Right shoulder Supine x 15 minutes  ?   ?PATIENT EDUCATION: ?Education details: HEP ?Person educated: Patient ?Education method: Explanation, Demonstration, Tactile cues, Verbal cues ?Education comprehension: verbalized understanding ?  ?HOME EXERCISE PROGRAM: ?Access Code: [email or web address] ?  ? ?ASSESSMENT: ?CLINICAL IMPRESSION: ?Patient tolerated therapy well with no adverse effects. Therapy focused primarily on PROM to progress shoulder mobility as allowed per protocol. He demonstrates improve shoulder ER this visit but continues to be limited with shoulder elevation PROM due to stiffness and pain. He reports discomfort throughout motion with increased pain at all end ranges. Use of vaso for pain reduction post session. Patient would benefit from continued skilled PT to progress as protocol allows in order to maximize functional ability.  ?  ?  ?OBJECTIVE IMPAIRMENTS decreased ROM, decreased strength, impaired UE functional use, and pain.  ?  ?ACTIVITY LIMITATIONS cleaning, meal prep, occupation,  and yard work.  ?  ?PERSONAL FACTORS Time since onset of injury/illness/exacerbation and 1 comorbidity: hyperlipidemia  are also affecting patient's functional outcome.  ?  ?  ?GOALS: ?SHORT TERM GOALS: Target date: 5/1

## 2022-02-02 ENCOUNTER — Other Ambulatory Visit: Payer: Self-pay

## 2022-02-02 ENCOUNTER — Ambulatory Visit: Payer: 59 | Admitting: Physical Therapy

## 2022-02-02 ENCOUNTER — Encounter: Payer: Self-pay | Admitting: Physical Therapy

## 2022-02-02 DIAGNOSIS — R293 Abnormal posture: Secondary | ICD-10-CM | POA: Diagnosis not present

## 2022-02-02 DIAGNOSIS — M25511 Pain in right shoulder: Secondary | ICD-10-CM | POA: Diagnosis not present

## 2022-02-02 DIAGNOSIS — M6281 Muscle weakness (generalized): Secondary | ICD-10-CM

## 2022-02-09 ENCOUNTER — Encounter: Payer: Self-pay | Admitting: Physical Therapy

## 2022-02-09 ENCOUNTER — Ambulatory Visit: Payer: 59 | Admitting: Physical Therapy

## 2022-02-09 DIAGNOSIS — M6281 Muscle weakness (generalized): Secondary | ICD-10-CM | POA: Diagnosis not present

## 2022-02-09 DIAGNOSIS — M25511 Pain in right shoulder: Secondary | ICD-10-CM | POA: Diagnosis not present

## 2022-02-09 DIAGNOSIS — R293 Abnormal posture: Secondary | ICD-10-CM | POA: Diagnosis not present

## 2022-02-09 NOTE — Therapy (Signed)
OUTPATIENT PHYSICAL THERAPY TREATMENT NOTE   Patient Name: Larry Cruz MRN: [id number] DOB:10-02-1970, 51 y.o., male Today's Date: 02/09/2022  PCP: Rita Ohara, MD REFERRING PROVIDER: Hiram Gash, MD   PT End of Session - 02/09/22 1027     Visit Number 6    Number of Visits 17    Date for PT Re-Evaluation 02/28/22    Authorization Type [employer]    Progress Note Due on Visit 10    PT Start Time 1025    PT Stop Time 1055    PT Time Calculation (min) 30 min               Past Medical History:  Diagnosis Date   Aseptic meningitis    Colorblind    Dyslipidemia    Headache    Herpes genitalis in men    Hx of elbow surgery 09/02/2019   right elbow percutaneous ECRB release/lateral elbow percutaneous release-Dr.Gramig   Hyperlipidemia    Lateral epicondylitis of right elbow 03/2018   s/p injection by Dr. Micheline Chapman; MRI 10/2018, f/b eval by Dr. Amedeo Plenty   Low back pain    Meningitis 2019   negative cultures   Torn tendon    R elbow   Past Surgical History:  Procedure Laterality Date   CERVICAL DISC SURGERY  2009   JONES, MD; fusion   ELBOW SURGERY  09/02/2019   right elbow percutaneous ECRB release/lateral elbow percutaneous release-Dr. Kenhorst Right    percutaneous--done by Dr. Amedeo Plenty in either 08/2019 or 09/2019   LUMBAR DISC SURGERY  2000   CARTER, MD; L4-L5 (right)   [place] SURGERY  11/2013   L4-5, Dr. Ronnald Ramp (left)   SHOULDER ARTHROSCOPY Right 12/30/2021   Procedure: ARTHROSCOPY SHOULDER/DEBRIDEMENT;  Surgeon: Hiram Gash, MD;  Location: [place];  Service: Orthopedics;  Laterality: Right;   SHOULDER ARTHROSCOPY WITH DISTAL CLAVICLE RESECTION Right 12/30/2021   Procedure: SHOULDER ARTHROSCOPY WITH DISTAL CLAVICLE EXCISION;  Surgeon: Hiram Gash, MD;  Location: [place];  Service: Orthopedics;  Laterality: Right;   SHOULDER ARTHROSCOPY WITH SUBACROMIAL DECOMPRESSION, ROTATOR CUFF REPAIR  AND BICEP TENDON REPAIR Right 12/30/2021   Procedure: SHOULDER ARTHROSCOPY WITH SUBACROMIAL DECOMPRESSION, ROTATOR CUFF REPAIR AND BICEP TENDON REPAIR;  Surgeon: Hiram Gash, MD;  Location: [place];  Service: Orthopedics;  Laterality: Right;   VASECTOMY  11/09   Patient Active Problem List   Diagnosis Date Noted   Mixed hyperlipidemia 12/21/2020   Intractable chronic migraine without aura and without status migrainosus 10/14/2018   Intractable headache 07/17/2018   Aseptic meningitis 04/23/2018   Left knee pain 12/08/2016   Plantar fasciitis of left foot 12/29/2015   Cavus deformity of foot 12/29/2015   Pure hypercholesterolemia 06/23/2011    REFERRING PROVIDER: Hiram Gash, MD   REFERRING DIAG: S/P right shoulder arthroscopy with subacromial decompression, distal clavicle excision, biceps  THERAPY DIAG:  Right shoulder pain, unspecified chronicity  Abnormal posture  Muscle weakness (generalized)  PERTINENT HISTORY: S/P right shoulder arthroscopy rotator cuff repiar with subacromial decompression, distal clavicle excision, biceps on 12/30/21  PRECAUTIONS: PROM right shoulder and elbow  SUBJECTIVE: Patient is 5 weeks and 6 days post op right rotator cuff repair. He reports increased pain over the last 2 days which He reports pain mainly at night when trying to sleep or when laying down trying to go to bed. He does report improvement in his self ROM for elevation  over 100.   PAIN:  Are you having pain? Yes:  NPRS scale: 6/10  Pain location: Right shoulder Pain description: Sharp Relieving factors: Ice, medication Aggravating Factors: Shoulder movement  PATIENT GOALS "Pain free and full use of arm."   OBJECTIVE: (objective measures completed at initial evaluation unless otherwise dated) PATIENT SURVEYS:  FOTO 45, predicted 56   POSTURE: Rounded shoulders    UPPER EXTREMITY ROM:    Passive ROM Right 01/03/2022 Left 01/03/2022 Right  01/17/22  Right 01/26/22 Right 02/02/2022 Right 02/09/22  Shoulder flexion 90 PROM 170 AROM 95 PROM 110 PROM at table  95 PROM supine 100 deg PROM 110 AAROM supine  Shoulder extension          Shoulder abduction 95 PROM 170 AROM 60 Per protocol   60 deg PROM 75 PROM  Shoulder adduction          Shoulder internal rotation 67 PROM 70 AROM      Shoulder external rotation 46 PROM 90 AROM 20 PROM 12 PROM at neutral 35-40 deg @ 45 deg scapular plane PROM 30 degrees @ 45 degrees PROM  Elbow flexion       WFL   Elbow extension       WFL   (Blank rows = not tested)   UPPER EXTREMITY MMT:   MMT Right 01/03/2022 Left 01/03/2022  Shoulder flexion      Shoulder extension      Shoulder abduction      Shoulder adduction      Shoulder internal rotation      Shoulder external rotation      Middle trapezius      Lower trapezius      Elbow flexion      Elbow extension      Wrist flexion      Wrist extension      Wrist ulnar deviation      Wrist radial deviation      Wrist pronation      Wrist supination      Grip strength (lbs)      (Blank rows = not tested)             **deterred due to protocol   PALPATION:  TTP over incision over anterior right shoulder and biceps              TODAY'S TREATMENT:  [facility] Adult PT Treatment:                                                DATE: 02/09/2022 Manual Therapy: Gentle oscillations to GHJ for pain reduction, grade 1,2 A/P and inferior glides  PROM right shoulder flexion in scapular plane and ER at  45 deg in scapular plane, abduction to 75, IR from 45 degrees abduction to stomach.    Therapeutic Exercise Supine AAROM chest press- hands clasped  Vasopneumatic (Game Ready): (not billed) Location: Right shoulder Time: 15 min Temperature: 34 deg Pressure: Medium  [facility] Adult PT Treatment:                                                DATE: 02/02/2022 Therapeutic Exercise Standing shoulder flexion step back stretch x 10 Manual Therapy: PROM right  shoulder  flexion in scapular plane and ER at 0 and 45 deg in scapular plane PROM right elbow flexion and extension Vasopneumatic (Game Ready): (not billed) Location: Right shoulder Time: 15 min Temperature: 34 deg Pressure: Medium   [facility] Adult PT Treatment:                                                DATE: 01/26/2022 Therapeutic Exercise: Standing shoulder flexion step back stretch x 10 ER PROM using wooden dowel 30 sec x 5  Supine hand clasped shoulder flexion PROM with elbows bent x 10 Manual Therapy: Gentle oscillations to GHJ for pain reduction, grade 1,2 A/P and inferior glides  PROM right shoulder flexion, abduction in scapular plane to 60 and ER at 0 - see chart for measurements Modalities:  Cryotherapy Right shoulder Supine x 15 minutes  IFC 15 mA concurrent with Cryotherapy , right shoulder  Self Care:  Consider increased frequency of ice applications/ consider TENS purchase for pain management if IFC helpful in clinic.   [facility] Adult PT Treatment:                                                DATE: 01/17/2022 Therapeutic Exercise: ER PROM using wooden dowel 10 sec x 10 Supine hand clasped shoulder flexion PROM with elbows bent x 10 Seated shoulder flexion table slide x 10 Standing shoulder flexion step back stretch x 10  Manual Therapy: PROM right shoulder flexion, abduction in scapular plane and ER at 0  Soft tissue mobilization right pectoralis, right upper trap Modalities:  Cryotherapy Right shoulder Supine x 15 minutes     PATIENT EDUCATION: Education details: HEP Person educated: Patient Education method: Consulting civil engineer, Media planner, Corporate treasurer cues, Verbal cues Education comprehension: verbalized understanding   HOME EXERCISE PROGRAM: Access Code: [email or web address]    ASSESSMENT: CLINICAL IMPRESSION: Patient tolerated therapy well with no adverse effects. Therapy focused primarily on PROM to progress shoulder mobility as allowed per protocol. His shoulder elevation  is improved today compared to last session. He will be 6 weeks post op tomorrow so began light AAROM with supine chest press. He did well with this and did not experience as much pain.  Use of vaso for pain reduction post session. Patient would benefit from continued skilled PT to progress as protocol allows in order to maximize functional ability.      OBJECTIVE IMPAIRMENTS decreased ROM, decreased strength, impaired UE functional use, and pain.    ACTIVITY LIMITATIONS cleaning, meal prep, occupation, and yard work.    PERSONAL FACTORS Time since onset of injury/illness/exacerbation and 1 comorbidity: hyperlipidemia  are also affecting patient's functional outcome.      GOALS: SHORT TERM GOALS: Target date: 01/17/2022   Patient will be independent with HEP for PT progression. Baseline: initial HEP addressed Status 01/17/22: independent with initial HEP Goal status: MET     LONG TERM GOALS: Target date: 02/28/2022   Patient will score 56 on FOTO. Baseline: 45 Goal status: INITIAL   2.  Patient will demonstrate </= 170 degrees right shoulder flexion and abduction AROM to improve ability to reach for items overhead in kitchen cabinet.  Baseline: unable to perform Goal status: INITIAL   3.  Patient will demonstrate 90 degrees  right shoulder ER AROM to improve ability to reach [facility] without limitations. Baseline: unable to perform Goal status: INITIAL   4.  Patient will demonstrate improved erect posture to assist with reaching [place] without limitations.  Baseline: rounded shoulders Goal status: INITIAL   5.  Patient will demonstrate >/= 4/5 right shoulder MMT to perform ADLs at home.  Baseline: unable to assess due to surgical protocol Goal status: INITIAL    PLAN: PT FREQUENCY: 1-2x/week   PT DURATION: 8 weeks   PLANNED INTERVENTIONS: Therapeutic exercises, Therapeutic activity, Neuromuscular re-education, Patient/Family education, Joint mobilization, Dry Needling, Electrical  stimulation, Cryotherapy, Moist heat, scar mobilization, Taping, and Manual therapy   PLAN FOR NEXT SESSION: PROM for shoulder flexion/external rotation and elbow, ice pack post session , assess response to AAROM and progress, isometrics at week 8. VASO end of session.     Hessie Diener, PTA 02/09/22 1:32 PM Phone: [phone] Fax: [phone]

## 2022-02-11 ENCOUNTER — Other Ambulatory Visit (HOSPITAL_COMMUNITY): Payer: Self-pay

## 2022-02-11 DIAGNOSIS — M19011 Primary osteoarthritis, right shoulder: Secondary | ICD-10-CM | POA: Diagnosis not present

## 2022-02-11 MED ORDER — GABAPENTIN 100 MG PO CAPS
100.0000 mg | ORAL_CAPSULE | Freq: Three times a day (TID) | ORAL | 1 refills | Status: DC
  Filled 2022-02-11: qty 90, 30d supply, fill #0

## 2022-02-16 ENCOUNTER — Ambulatory Visit: Payer: 59 | Admitting: Physical Therapy

## 2022-02-16 ENCOUNTER — Encounter: Payer: Self-pay | Admitting: Physical Therapy

## 2022-02-16 DIAGNOSIS — R293 Abnormal posture: Secondary | ICD-10-CM | POA: Diagnosis not present

## 2022-02-16 DIAGNOSIS — M6281 Muscle weakness (generalized): Secondary | ICD-10-CM

## 2022-02-16 DIAGNOSIS — M25511 Pain in right shoulder: Secondary | ICD-10-CM | POA: Diagnosis not present

## 2022-02-16 NOTE — Therapy (Signed)
OUTPATIENT PHYSICAL THERAPY TREATMENT NOTE   Patient Name: Larry Cruz MRN: [id number] DOB:1970/10/06, 51 y.o., male Today's Date: 02/16/2022  PCP: Rita Ohara, MD REFERRING PROVIDER: Rita Ohara, MD   PT End of Session - 02/16/22 1232     Visit Number 7    Number of Visits 17    Date for PT Re-Evaluation 02/28/22    Authorization Type [employer]    Progress Note Due on Visit 10    PT Start Time 1230    PT Stop Time 1317    PT Time Calculation (min) 47 min               Past Medical History:  Diagnosis Date   Aseptic meningitis    Colorblind    Dyslipidemia    Headache    Herpes genitalis in men    Hx of elbow surgery 09/02/2019   right elbow percutaneous ECRB release/lateral elbow percutaneous release-Dr.Gramig   Hyperlipidemia    Lateral epicondylitis of right elbow 03/2018   s/p injection by Dr. Micheline Chapman; MRI 10/2018, f/b eval by Dr. Amedeo Plenty   Low back pain    Meningitis 2019   negative cultures   Torn tendon    R elbow   Past Surgical History:  Procedure Laterality Date   CERVICAL DISC SURGERY  2009   JONES, MD; fusion   ELBOW SURGERY  09/02/2019   right elbow percutaneous ECRB release/lateral elbow percutaneous release-Dr. [place] Right    percutaneous--done by Dr. Amedeo Plenty in either 08/2019 or 09/2019   LUMBAR DISC SURGERY  2000   CARTER, MD; L4-L5 (right)   [place] [facility]GERY  11/2013   L4-5, Dr. Ronnald Ramp (left)   SHOULDER ARTHROSCOPY Right 12/30/2021   Procedure: ARTHROSCOPY SHOULDER/DEBRIDEMENT;  Surgeon: Hiram Gash, MD;  Location: [place];  Service: Orthopedics;  Laterality: Right;   SHOULDER ARTHROSCOPY WITH DISTAL CLAVICLE RESECTION Right 12/30/2021   Procedure: SHOULDER ARTHROSCOPY WITH DISTAL CLAVICLE EXCISION;  Surgeon: Hiram Gash, MD;  Location: [place];  Service: Orthopedics;  Laterality: Right;   SHOULDER ARTHROSCOPY WITH SUBACROMIAL DECOMPRESSION, ROTATOR CUFF REPAIR AND  BICEP TENDON REPAIR Right 12/30/2021   Procedure: SHOULDER ARTHROSCOPY WITH SUBACROMIAL DECOMPRESSION, ROTATOR CUFF REPAIR AND BICEP TENDON REPAIR;  Surgeon: Hiram Gash, MD;  Location: [place];  Service: Orthopedics;  Laterality: Right;   VASECTOMY  11/09   Patient Active Problem List   Diagnosis Date Noted   Mixed hyperlipidemia 12/21/2020   Intractable chronic migraine without aura and without status migrainosus 10/14/2018   Intractable headache 07/17/2018   Aseptic meningitis 04/23/2018   Left knee pain 12/08/2016   Plantar fasciitis of left foot 12/29/2015   Cavus deformity of foot 12/29/2015   Pure hypercholesterolemia 06/23/2011    REFERRING PROVIDER: Hiram Gash, MD   REFERRING DIAG: S/P right shoulder arthroscopy with subacromial decompression, distal clavicle excision, biceps  THERAPY DIAG:  Right shoulder pain, unspecified chronicity  Abnormal posture  Muscle weakness (generalized)  PERTINENT HISTORY: S/P right shoulder arthroscopy rotator cuff repiar with subacromial decompression, distal clavicle excision, biceps on 12/30/21  PRECAUTIONS: PROM right shoulder and elbow  SUBJECTIVE: Patient is 6 weeks and 6 days post op right rotator cuff repair. He saw MD for follow up who discontinued his sling and injected cortisone and torodol into his shoulder for pain reduction. Pt reports pain has improved overall with injections however the last two days he has had difficulty with sleeping  due to pain. Today pain is 5/10.  PAIN:  Are you having pain? Yes:  NPRS scale: 5/10  Pain location: Right shoulder Pain description: Sharp, tight Relieving factors: Ice, medication Aggravating Factors: Shoulder movement  PATIENT GOALS "Pain free and full use of arm."   OBJECTIVE: (objective measures completed at initial evaluation unless otherwise dated) PATIENT SURVEYS:  FOTO 45, predicted 56   POSTURE: Rounded shoulders    UPPER EXTREMITY ROM:    Passive  ROM Right 01/03/2022 Left 01/03/2022 Right  01/17/22 Right 01/26/22 Right 02/02/2022 Right 02/09/22 Right   02/16/22 Right  AROM 02/16/22  Shoulder flexion 90 PROM 170 AROM 95 PROM 110 PROM at table  95 PROM supine 100 deg PROM 110 AAROM supine 117 flexion AAROM/130 scaption AAROM 115  Shoulder extension            Shoulder abduction 95 PROM 170 AROM 60 Per protocol   60 deg PROM 75 PROM 90 PROM Scaption 105  Shoulder adduction            Shoulder internal rotation 67 PROM 70 AROM      Reaches right upper gluteal  Shoulder external rotation 46 PROM 90 AROM 20 PROM 12 PROM at neutral 35-40 deg @ 45 deg scapular plane PROM 30 degrees @ 45 degrees PROM 35 @ 45 degrees AAROM Reaches occiput  Elbow flexion       WFL     Elbow extension       WFL     (Blank rows = not tested)   UPPER EXTREMITY MMT:   MMT Right 01/03/2022 Left 01/03/2022  Shoulder flexion      Shoulder extension      Shoulder abduction      Shoulder adduction      Shoulder internal rotation      Shoulder external rotation      Middle trapezius      Lower trapezius      Elbow flexion      Elbow extension      Wrist flexion      Wrist extension      Wrist ulnar deviation      Wrist radial deviation      Wrist pronation      Wrist supination      Grip strength (lbs)      (Blank rows = not tested)             **deterred due to protocol   PALPATION:  TTP over incision over anterior right shoulder and biceps              TODAY'S TREATMENT:  [facility] Adult PT Treatment:                                                DATE: 02/16/2022 Manual Therapy: Gentle oscillations to GHJ for pain reduction, grade 1,2 A/P and inferior glides  PROM right shoulder flexion in scapular plane and ER at  45 deg in scapular plane, abduction to 90, IR from 45 degrees abduction to stomach.   Therapeutic Exercise Supine AAROM chest press- hands clasped Supine pullovers with wooden dowel Supine horizontals with wooden dowel Supine AAROM ER  at 45 degrees Pulleys flexion  x 2 minutes Pulleys scaption x 1 minute AROM  Vasopneumatic (Game Ready): (not billed) Location: Right shoulder Time: 15 min Temperature: 34 deg Pressure:  Medium   [facility] Adult PT Treatment:                                                DATE: 02/09/2022 Manual Therapy: Gentle oscillations to GHJ for pain reduction, grade 1,2 A/P and inferior glides  PROM right shoulder flexion in scapular plane and ER at  45 deg in scapular plane, abduction to 75, IR from 45 degrees abduction to stomach.    Therapeutic Exercise Supine AAROM chest press- hands clasped  Vasopneumatic (Game Ready): (not billed) Location: Right shoulder Time: 15 min Temperature: 34 deg Pressure: Medium  [facility] Adult PT Treatment:                                                DATE: 02/02/2022 Therapeutic Exercise Standing shoulder flexion step back stretch x 10 Manual Therapy: PROM right shoulder flexion in scapular plane and ER at 0 and 45 deg in scapular plane PROM right elbow flexion and extension Vasopneumatic (Game Ready): (not billed) Location: Right shoulder Time: 15 min Temperature: 34 deg Pressure: Medium   [facility] Adult PT Treatment:                                                DATE: 01/26/2022 Therapeutic Exercise: Standing shoulder flexion step back stretch x 10 ER PROM using wooden dowel 30 sec x 5  Supine hand clasped shoulder flexion PROM with elbows bent x 10 Manual Therapy: Gentle oscillations to GHJ for pain reduction, grade 1,2 A/P and inferior glides  PROM right shoulder flexion, abduction in scapular plane to 60 and ER at 0 - see chart for measurements Modalities:  Cryotherapy Right shoulder Supine x 15 minutes  IFC 15 mA concurrent with Cryotherapy , right shoulder  Self Care:  Consider increased frequency of ice applications/ consider TENS purchase for pain management if IFC helpful in clinic.   [facility] Adult PT Treatment:                                                 DATE: 01/17/2022 Therapeutic Exercise: ER PROM using wooden dowel 10 sec x 10 Supine hand clasped shoulder flexion PROM with elbows bent x 10 Seated shoulder flexion table slide x 10 Standing shoulder flexion step back stretch x 10  Manual Therapy: PROM right shoulder flexion, abduction in scapular plane and ER at 0  Soft tissue mobilization right pectoralis, right upper trap Modalities:  Cryotherapy Right shoulder Supine x 15 minutes     PATIENT EDUCATION: Education details: HEP Person educated: Patient Education method: Consulting civil engineer, Media planner, Corporate treasurer cues, Verbal cues Education comprehension: verbalized understanding   HOME EXERCISE PROGRAM: Access Code: [email or web address]    ASSESSMENT: CLINICAL IMPRESSION: Patient tolerated therapy well with no adverse effects. Therapy focused primarily on PROM to progress shoulder mobility as allowed per protocol. His shoulder elevation is improved today compared to last session. He will be 6 weeks post op tomorrow so began light AAROM with  supine chest press. He did well with this and did not experience as much pain.  Use of vaso for pain reduction post session. Patient would benefit from continued skilled PT to progress as protocol allows in order to maximize functional ability.      OBJECTIVE IMPAIRMENTS decreased ROM, decreased strength, impaired UE functional use, and pain.    ACTIVITY LIMITATIONS cleaning, meal prep, occupation, and yard work.    PERSONAL FACTORS Time since onset of injury/illness/exacerbation and 1 comorbidity: hyperlipidemia  are also affecting patient's functional outcome.      GOALS: SHORT TERM GOALS: Target date: 01/17/2022   Patient will be independent with HEP for PT progression. Baseline: initial HEP addressed Status 01/17/22: independent with initial HEP Goal status: MET     LONG TERM GOALS: Target date: 02/28/2022   Patient will score 56 on FOTO. Baseline: 45 Goal status: INITIAL   2.  Patient  will demonstrate </= 170 degrees right shoulder flexion and abduction AROM to improve ability to reach for items overhead in kitchen cabinet.  Baseline: unable to perform Goal status: INITIAL   3.  Patient will demonstrate 90 degrees right shoulder ER AROM to improve ability to reach [facility] without limitations. Baseline: unable to perform Goal status: INITIAL   4.  Patient will demonstrate improved erect posture to assist with reaching [place] without limitations.  Baseline: rounded shoulders Goal status: INITIAL   5.  Patient will demonstrate >/= 4/5 right shoulder MMT to perform ADLs at home.  Baseline: unable to assess due to surgical protocol Goal status: INITIAL    PLAN: PT FREQUENCY: 1-2x/week   PT DURATION: 8 weeks   PLANNED INTERVENTIONS: Therapeutic exercises, Therapeutic activity, Neuromuscular re-education, Patient/Family education, Joint mobilization, Dry Needling, Electrical stimulation, Cryotherapy, Moist heat, scar mobilization, Taping, and Manual therapy   PLAN FOR NEXT SESSION: PROM for shoulder flexion/external rotation and elbow, ice pack post session , assess response to AAROM and progress, isometrics at week 8. VASO end of session.     Hessie Diener, PTA 02/16/22 1:14 PM Phone: [phone] Fax: [phone]

## 2022-02-21 ENCOUNTER — Ambulatory Visit: Payer: 59 | Attending: Orthopaedic Surgery | Admitting: Physical Therapy

## 2022-02-21 ENCOUNTER — Encounter: Payer: Self-pay | Admitting: Physical Therapy

## 2022-02-21 DIAGNOSIS — M25511 Pain in right shoulder: Secondary | ICD-10-CM | POA: Insufficient documentation

## 2022-02-21 DIAGNOSIS — R293 Abnormal posture: Secondary | ICD-10-CM | POA: Insufficient documentation

## 2022-02-21 DIAGNOSIS — M6281 Muscle weakness (generalized): Secondary | ICD-10-CM | POA: Insufficient documentation

## 2022-02-21 NOTE — Therapy (Signed)
OUTPATIENT PHYSICAL THERAPY TREATMENT NOTE   Patient Name: Larry Cruz MRN: [id number] DOB:1971-02-18, 51 y.o., male Today's Date: 02/21/2022  PCP: Rita Ohara, MD REFERRING PROVIDER: Rita Ohara, MD   PT End of Session - 02/21/22 0847     Visit Number 8    Number of Visits 17    Date for PT Re-Evaluation 02/28/22    Authorization Type [employer]    Progress Note Due on Visit 10    PT Start Time 0845    PT Stop Time 0930    PT Time Calculation (min) 45 min               Past Medical History:  Diagnosis Date   Aseptic meningitis    Colorblind    Dyslipidemia    Headache    Herpes genitalis in men    Hx of elbow surgery 09/02/2019   right elbow percutaneous ECRB release/lateral elbow percutaneous release-Dr.Gramig   Hyperlipidemia    Lateral epicondylitis of right elbow 03/2018   s/p injection by Dr. Micheline Chapman; MRI 10/2018, f/b eval by Dr. Amedeo Plenty   Low back pain    Meningitis 2019   negative cultures   Torn tendon    R elbow   Past Surgical History:  Procedure Laterality Date   CERVICAL DISC SURGERY  2009   JONES, MD; fusion   ELBOW SURGERY  09/02/2019   right elbow percutaneous ECRB release/lateral elbow percutaneous release-Dr. [place] Right    percutaneous--done by Dr. Amedeo Plenty in either 08/2019 or 09/2019   LUMBAR DISC SURGERY  2000   CARTER, MD; L4-L5 (right)   LUMBAR DISC SURGERY  11/2013   L4-5, Dr. Ronnald Ramp (left)   SHOULDER ARTHROSCOPY Right 12/30/2021   Procedure: ARTHROSCOPY SHOULDER/DEBRIDEMENT;  Surgeon: Hiram Gash, MD;  Location: [place];  Service: Orthopedics;  Laterality: Right;   SHOULDER ARTHROSCOPY WITH DISTAL CLAVICLE RESECTION Right 12/30/2021   Procedure: SHOULDER ARTHROSCOPY WITH DISTAL CLAVICLE EXCISION;  Surgeon: Hiram Gash, MD;  Location: [place];  Service: Orthopedics;  Laterality: Right;   SHOULDER ARTHROSCOPY WITH SUBACROMIAL DECOMPRESSION, ROTATOR CUFF REPAIR AND  BICEP TENDON REPAIR Right 12/30/2021   Procedure: SHOULDER ARTHROSCOPY WITH SUBACROMIAL DECOMPRESSION, ROTATOR CUFF REPAIR AND BICEP TENDON REPAIR;  Surgeon: Hiram Gash, MD;  Location: [place];  Service: Orthopedics;  Laterality: Right;   VASECTOMY  11/09   Patient Active Problem List   Diagnosis Date Noted   Mixed hyperlipidemia 12/21/2020   Intractable chronic migraine without aura and without status migrainosus 10/14/2018   Intractable headache 07/17/2018   Aseptic meningitis 04/23/2018   Left knee pain 12/08/2016   Plantar fasciitis of left foot 12/29/2015   Cavus deformity of foot 12/29/2015   Pure hypercholesterolemia 06/23/2011    REFERRING PROVIDER: Hiram Gash, MD   REFERRING DIAG: S/P right shoulder arthroscopy with subacromial decompression, distal clavicle excision, biceps  THERAPY DIAG:  Right shoulder pain, unspecified chronicity  Abnormal posture  Muscle weakness (generalized)  PERTINENT HISTORY: S/P right shoulder arthroscopy rotator cuff repiar with subacromial decompression, distal clavicle excision, biceps on 12/30/21  PRECAUTIONS: PROM right shoulder and elbow  SUBJECTIVE: Patient is 7 weeks apost op right rotator cuff repair. He reports improved sleep last night and pain rated at 4/10.     PAIN:  Are you having pain? Yes:  NPRS scale: 4/10  Pain location: Right shoulder Pain description: Sharp, tight Relieving factors: Ice, medication Aggravating Factors: Shoulder movement  PATIENT GOALS "Pain free and full use of arm."   OBJECTIVE: (objective measures completed at initial evaluation unless otherwise dated) PATIENT SURVEYS:  FOTO 45, predicted 56   POSTURE: Rounded shoulders    UPPER EXTREMITY ROM:    Passive ROM Right 01/03/2022 Left 01/03/2022 Right  01/17/22 Right 01/26/22 Right 02/02/2022 Right 02/09/22 Right   02/16/22 Right  AROM 02/16/22 Right AROM  Shoulder flexion 90 PROM 170 AROM 95 PROM 110 PROM at table   95 PROM supine 100 deg PROM 110 AAROM supine 117 flexion AAROM/130 scaption AAROM 115 120  Shoulder extension             Shoulder abduction 95 PROM 170 AROM 60 Per protocol   60 deg PROM 75 PROM 90 PROM Scaption 105 Scaption 105  Shoulder adduction             Shoulder internal rotation 67 PROM 70 AROM      Reaches right upper gluteal Reaches upper right gluteal  Shoulder external rotation 46 PROM 90 AROM 20 PROM 12 PROM at neutral 35-40 deg @ 45 deg scapular plane PROM 30 degrees @ 45 degrees PROM 35 @ 45 degrees AAROM Reaches occiput 45@ 60 abduction  Elbow flexion       WFL      Elbow extension       WFL      (Blank rows = not tested)   UPPER EXTREMITY MMT:   MMT Right 01/03/2022 Left 01/03/2022  Shoulder flexion      Shoulder extension      Shoulder abduction      Shoulder adduction      Shoulder internal rotation      Shoulder external rotation      Middle trapezius      Lower trapezius      Elbow flexion      Elbow extension      Wrist flexion      Wrist extension      Wrist ulnar deviation      Wrist radial deviation      Wrist pronation      Wrist supination      Grip strength (lbs)      (Blank rows = not tested)             **deterred due to protocol   PALPATION:  TTP over incision over anterior right shoulder and biceps              TODAY'S TREATMENT:  [facility] Adult PT Treatment:                                                DATE: 02/21/2022 Manual Therapy: Gentle oscillations to GHJ for pain reduction, grade 1,2 A/P and inferior glides  PROM right shoulder flexion, abduction, ER, IR to tolerance  Therapeutic Exercise Pulleys flexion  x 2 minutes Pulleys scaption x 1 minute AAROM standing UE ranger flexion x 10  Standing UE ranger ER AAROM Supine AAROM chest press- UE ranger Supine pullovers with UE ranger Supine horizontals with UE ranger Supine AAROM ER at 45 degrees with UE ranger  Vasopneumatic (Game Ready): (not billed) Location: Right  shoulder Time: 15 min Temperature: 34 deg Pressure: Medium  [facility] Adult PT Treatment:  DATE: 02/16/2022 Manual Therapy: Gentle oscillations to GHJ for pain reduction, grade 1,2 A/P and inferior glides  PROM right shoulder flexion in scapular plane and ER at  45 deg in scapular plane, abduction to 90, IR from 45 degrees abduction to stomach.   Therapeutic Exercise Supine AAROM chest press- hands clasped Supine pullovers with wooden dowel Supine horizontals with wooden dowel Supine AAROM ER at 45 degrees Pulleys flexion  x 2 minutes Pulleys scaption x 1 minute AROM  Vasopneumatic (Game Ready): (not billed) Location: Right shoulder Time: 15 min Temperature: 34 deg Pressure: Medium   [facility] Adult PT Treatment:                                                DATE: 02/09/2022 Manual Therapy: Gentle oscillations to GHJ for pain reduction, grade 1,2 A/P and inferior glides  PROM right shoulder flexion in scapular plane and ER at  45 deg in scapular plane, abduction to 75, IR from 45 degrees abduction to stomach.    Therapeutic Exercise Supine AAROM chest press- hands clasped  Vasopneumatic (Game Ready): (not billed) Location: Right shoulder Time: 15 min Temperature: 34 deg Pressure: Medium  [facility] Adult PT Treatment:                                                DATE: 02/02/2022 Therapeutic Exercise Standing shoulder flexion step back stretch x 10 Manual Therapy: PROM right shoulder flexion in scapular plane and ER at 0 and 45 deg in scapular plane PROM right elbow flexion and extension Vasopneumatic (Game Ready): (not billed) Location: Right shoulder Time: 15 min Temperature: 34 deg Pressure: Medium   [facility] Adult PT Treatment:                                                DATE: 01/26/2022 Therapeutic Exercise: Standing shoulder flexion step back stretch x 10 ER PROM using wooden dowel 30 sec x 5  Supine hand clasped shoulder  flexion PROM with elbows bent x 10 Manual Therapy: Gentle oscillations to GHJ for pain reduction, grade 1,2 A/P and inferior glides  PROM right shoulder flexion, abduction in scapular plane to 60 and ER at 0 - see chart for measurements Modalities:  Cryotherapy Right shoulder Supine x 15 minutes  IFC 15 mA concurrent with Cryotherapy , right shoulder  Self Care:  Consider increased frequency of ice applications/ consider TENS purchase for pain management if IFC helpful in clinic.   [facility] Adult PT Treatment:                                                DATE: 01/17/2022 Therapeutic Exercise: ER PROM using wooden dowel 10 sec x 10 Supine hand clasped shoulder flexion PROM with elbows bent x 10 Seated shoulder flexion table slide x 10 Standing shoulder flexion step back stretch x 10  Manual Therapy: PROM right shoulder flexion, abduction in scapular plane and ER at 0  Soft  tissue mobilization right pectoralis, right upper trap Modalities:  Cryotherapy Right shoulder Supine x 15 minutes     PATIENT EDUCATION: Education details: HEP Person educated: Patient Education method: Consulting civil engineer, Demonstration, Corporate treasurer cues, Verbal cues Education comprehension: verbalized understanding   HOME EXERCISE PROGRAM: Access Code: [email or web address]    ASSESSMENT: CLINICAL IMPRESSION: Patient tolerated therapy well with no adverse effects. Therapy focused primarily on AAROM and PROM to progress shoulder mobility as allowed per protocol. Pt is 7 weeks post-op. His AROM has improved for shoulder flexion. He has increased pain with end range PROM.  Use of vaso for pain reduction post session. Patient would benefit from continued skilled PT to progress as protocol allows in order to maximize functional ability.      OBJECTIVE IMPAIRMENTS decreased ROM, decreased strength, impaired UE functional use, and pain.    ACTIVITY LIMITATIONS cleaning, meal prep, occupation, and yard work.    PERSONAL FACTORS Time since  onset of injury/illness/exacerbation and 1 comorbidity: hyperlipidemia  are also affecting patient's functional outcome.      GOALS: SHORT TERM GOALS: Target date: 01/17/2022   Patient will be independent with HEP for PT progression. Baseline: initial HEP addressed Status 01/17/22: independent with initial HEP Goal status: MET     LONG TERM GOALS: Target date: 02/28/2022   Patient will score 56 on FOTO. Baseline: 45 Goal status: INITIAL   2.  Patient will demonstrate </= 170 degrees right shoulder flexion and abduction AROM to improve ability to reach for items overhead in kitchen cabinet.  Baseline: unable to perform Goal status: INITIAL   3.  Patient will demonstrate 90 degrees right shoulder ER AROM to improve ability to reach [facility] without limitations. Baseline: unable to perform Goal status: INITIAL   4.  Patient will demonstrate improved erect posture to assist with reaching [place] without limitations.  Baseline: rounded shoulders Goal status: INITIAL   5.  Patient will demonstrate >/= 4/5 right shoulder MMT to perform ADLs at home.  Baseline: unable to assess due to surgical protocol Goal status: INITIAL    PLAN: PT FREQUENCY: 1-2x/week   PT DURATION: 8 weeks   PLANNED INTERVENTIONS: Therapeutic exercises, Therapeutic activity, Neuromuscular re-education, Patient/Family education, Joint mobilization, Dry Needling, Electrical stimulation, Cryotherapy, Moist heat, scar mobilization, Taping, and Manual therapy   PLAN FOR NEXT SESSION: PROM for shoulder flexion/external rotation and elbow, ice pack post session , assess response to AAROM and progress, isometrics at week 8. VASO end of session.     Hessie Diener, PTA 02/21/22 9:31 AM Phone: [phone] Fax: [phone]

## 2022-02-22 ENCOUNTER — Ambulatory Visit: Payer: 59 | Admitting: Physical Therapy

## 2022-02-23 ENCOUNTER — Encounter: Payer: Self-pay | Admitting: Physical Therapy

## 2022-02-23 ENCOUNTER — Ambulatory Visit: Payer: 59 | Admitting: Physical Therapy

## 2022-02-23 DIAGNOSIS — R293 Abnormal posture: Secondary | ICD-10-CM

## 2022-02-23 DIAGNOSIS — M25511 Pain in right shoulder: Secondary | ICD-10-CM

## 2022-02-23 DIAGNOSIS — M6281 Muscle weakness (generalized): Secondary | ICD-10-CM | POA: Diagnosis not present

## 2022-02-23 NOTE — Therapy (Signed)
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 Micheline Chapman; MRI 10/2018, f/b eval by Dr. Amedeo Plenty   Low back pain    Meningitis 2019   negative cultures   Torn tendon    R elbow   Past Surgical History:  Procedure Laterality Date   CERVICAL DISC SURGERY  2009   JONES, MD; fusion   ELBOW SURGERY  09/02/2019   right elbow percutaneous ECRB release/lateral elbow percutaneous release-Dr. Tappan Right    percutaneous--done by Dr. Amedeo Plenty in either 08/2019 or 09/2019   LUMBAR DISC SURGERY  2000   CARTER, MD; L4-L5 (right)   [place] [facility]  11/2013   L4-5, Dr. Ronnald Ramp (left)   SHOULDER ARTHROSCOPY Right 12/30/2021   Procedure: ARTHROSCOPY SHOULDER/DEBRIDEMENT;  Surgeon: Hiram Gash, MD;  Location: [place];  Service: Orthopedics;  Laterality: Right;   SHOULDER ARTHROSCOPY WITH DISTAL CLAVICLE RESECTION Right 12/30/2021   Procedure: SHOULDER ARTHROSCOPY WITH DISTAL CLAVICLE EXCISION;  Surgeon: Hiram Gash, MD;  Location: [place];  Service: Orthopedics;  Laterality: Right;   SHOULDER ARTHROSCOPY WITH SUBACROMIAL DECOMPRESSION, ROTATOR CUFF REPAIR AND BICEP TENDON REPAIR Right 12/30/2021    Procedure: SHOULDER ARTHROSCOPY WITH SUBACROMIAL DECOMPRESSION, ROTATOR CUFF REPAIR AND BICEP TENDON REPAIR;  Surgeon: Hiram Gash, MD;  Location: [place];  Service: Orthopedics;  Laterality: Right;   VASECTOMY  11/09   Patient Active Problem List   Diagnosis Date Noted   Mixed hyperlipidemia 12/21/2020   Intractable chronic migraine without aura and without status migrainosus 10/14/2018   Intractable headache 07/17/2018   Aseptic meningitis 04/23/2018   Left knee pain 12/08/2016   Plantar fasciitis of left foot 12/29/2015   Cavus deformity of foot 12/29/2015   Pure hypercholesterolemia 06/23/2011    REFERRING PROVIDER: Hiram Gash, MD   REFERRING DIAG: S/P right shoulder arthroscopy with subacromial decompression, distal clavicle excision, biceps  THERAPY DIAG:  Right shoulder pain, unspecified chronicity  Abnormal posture  Muscle weakness (generalized)  PERTINENT HISTORY: S/P right shoulder arthroscopy rotator cuff repiar with subacromial decompression, distal clavicle excision, biceps on 12/30/21  PRECAUTIONS: PROM right shoulder and elbow  SUBJECTIVE: Patient is 7 weeks and 6 days post op right rotator cuff repair. He reports pain progressively gets worse during the week with continued difficulty sleeping.     PAIN:  Are you having pain? Yes:  NPRS scale: -/10  Pain location: Right shoulder Pain description: Sharp, tight Relieving factors: Ice, medication Aggravating Factors: Shoulder movement  PATIENT GOALS "Pain  free and full use of arm."   OBJECTIVE: (objective measures completed at initial evaluation unless otherwise dated) PATIENT SURVEYS:  FOTO 45, predicted 56   POSTURE: Rounded shoulders    UPPER EXTREMITY ROM:    Passive ROM Right 01/03/2022 Left 01/03/2022 Right  01/17/22 Right 01/26/22 Right 02/02/2022 Right 02/09/22 Right   02/16/22 Right  AROM 02/16/22 Right AROM 02/21/22 02/23/22  Shoulder flexion 90 PROM 170 AROM 95  PROM 110 PROM at table  95 PROM supine 100 deg PROM 110 AAROM supine 117 flexion AAROM/130 scaption AAROM 115 120 PROM 130  Shoulder extension              Shoulder abduction 95 PROM 170 AROM 60 Per protocol   60 deg PROM 75 PROM 90 PROM Scaption 105 Scaption 105   Shoulder adduction              Shoulder internal rotation 67 PROM 70 AROM      Reaches right upper gluteal Reaches upper right gluteal   Shoulder external rotation 46 PROM 90 AROM 20 PROM 12 PROM at neutral 35-40 deg @ 45 deg scapular plane PROM 30 degrees @ 45 degrees PROM 35 @ 45 degrees AAROM Reaches occiput 45@ 60 abduction  PROM 60 @ 75 degrees abduction  Elbow flexion       WFL       Elbow extension       WFL       (Blank rows = not tested)   UPPER EXTREMITY MMT:   MMT Right 01/03/2022 Left 01/03/2022  Shoulder flexion      Shoulder extension      Shoulder abduction      Shoulder adduction      Shoulder internal rotation      Shoulder external rotation      Middle trapezius      Lower trapezius      Elbow flexion      Elbow extension      Wrist flexion      Wrist extension      Wrist ulnar deviation      Wrist radial deviation      Wrist pronation      Wrist supination      Grip strength (lbs)      (Blank rows = not tested)             **deterred due to protocol   PALPATION:  TTP over incision over anterior right shoulder and biceps              TODAY'S TREATMENT:  [facility] Adult PT Treatment:                                                DATE: 02/23/22 Therapeutic Exercise: Pulleys flexion, scaption, horizontals  Manual Therapy: Gentle oscillations to GHJ for pain reduction, grade 2,3 A/P and inferior glides   TPR right upper trap, sub scap, pec, STW  to deltoid Sidelying scapular mobs , supine passive protraction PROM right shoulder flexion, abduction, ER, IR to tolerance, various planes Vasopneumatic (Game Ready): (not billed) Location: Right shoulder Time: 15 min Temperature: 34 deg Pressure:  Medium   [facility] Adult PT Treatment:  DATE: 02/21/2022 Manual Therapy: Gentle oscillations to GHJ for pain reduction, grade 1,2 A/P and inferior glides  PROM right shoulder flexion, abduction, ER, IR to tolerance  Therapeutic Exercise Pulleys flexion  x 2 minutes Pulleys scaption x 1 minute AAROM standing UE ranger flexion x 10  Standing UE ranger ER AAROM Supine AAROM chest press- UE ranger Supine pullovers with UE ranger Supine horizontals with UE ranger Supine AAROM ER at 45 degrees with UE ranger  Vasopneumatic (Game Ready): (not billed) Location: Right shoulder Time: 15 min Temperature: 34 deg Pressure: Medium  [facility] Adult PT Treatment:                                                DATE: 02/16/2022 Manual Therapy: Gentle oscillations to GHJ for pain reduction, grade 1,2 A/P and inferior glides  PROM right shoulder flexion in scapular plane and ER at  45 deg in scapular plane, abduction to 90, IR from 45 degrees abduction to stomach.   Therapeutic Exercise Supine AAROM chest press- hands clasped Supine pullovers with wooden dowel Supine horizontals with wooden dowel Supine AAROM ER at 45 degrees Pulleys flexion  x 2 minutes Pulleys scaption x 1 minute AROM  Vasopneumatic (Game Ready): (not billed) Location: Right shoulder Time: 15 min Temperature: 34 deg Pressure: Medium   [facility] Adult PT Treatment:                                                DATE: 02/09/2022 Manual Therapy: Gentle oscillations to GHJ for pain reduction, grade 1,2 A/P and inferior glides  PROM right shoulder flexion in scapular plane and ER at  45 deg in scapular plane, abduction to 75, IR from 45 degrees abduction to stomach.    Therapeutic Exercise Supine AAROM chest press- hands clasped  Vasopneumatic (Game Ready): (not billed) Location: Right shoulder Time: 15 min Temperature: 34 deg Pressure: Medium  [facility] Adult PT Treatment:                                                 DATE: 02/02/2022 Therapeutic Exercise Standing shoulder flexion step back stretch x 10 Manual Therapy: PROM right shoulder flexion in scapular plane and ER at 0 and 45 deg in scapular plane PROM right elbow flexion and extension Vasopneumatic (Game Ready): (not billed) Location: Right shoulder Time: 15 min Temperature: 34 deg Pressure: Medium   [facility] Adult PT Treatment:                                                DATE: 01/26/2022 Therapeutic Exercise: Standing shoulder flexion step back stretch x 10 ER PROM using wooden dowel 30 sec x 5  Supine hand clasped shoulder flexion PROM with elbows bent x 10 Manual Therapy: Gentle oscillations to GHJ for pain reduction, grade 1,2 A/P and inferior glides  PROM right shoulder flexion, abduction in scapular plane to 60 and ER at 0 - see chart for measurements Modalities:  Cryotherapy Right shoulder Supine x 15 minutes  IFC 15 mA concurrent with Cryotherapy , right shoulder  Self Care:  Consider increased frequency of ice applications/ consider TENS purchase for pain management if IFC helpful in clinic.   [facility] Adult PT Treatment:                                                DATE: 01/17/2022 Therapeutic Exercise: ER PROM using wooden dowel 10 sec x 10 Supine hand clasped shoulder flexion PROM with elbows bent x 10 Seated shoulder flexion table slide x 10 Standing shoulder flexion step back stretch x 10  Manual Therapy: PROM right shoulder flexion, abduction in scapular plane and ER at 0  Soft tissue mobilization right pectoralis, right upper trap Modalities:  Cryotherapy Right shoulder Supine x 15 minutes     PATIENT EDUCATION: Education details: HEP Person educated: Patient Education method: Consulting civil engineer, Media planner, Corporate treasurer cues, Verbal cues Education comprehension: verbalized understanding   HOME EXERCISE PROGRAM: Access Code: [email or web address]    ASSESSMENT: CLINICAL IMPRESSION: Patient tolerated  therapy well with no adverse effects. Therapy focused primarily on AAROM and PROM to progress shoulder mobility as allowed per protocol. Worked on scapular mobs and Trigger point release to decrease restrictions and tension.  PROM continues to improve.  Patient would benefit from continued skilled PT to progress as protocol allows in order to maximize functional ability. Can begin isometrics next visit.      OBJECTIVE IMPAIRMENTS decreased ROM, decreased strength, impaired UE functional use, and pain.    ACTIVITY LIMITATIONS cleaning, meal prep, occupation, and yard work.    PERSONAL FACTORS Time since onset of injury/illness/exacerbation and 1 comorbidity: hyperlipidemia  are also affecting patient's functional outcome.      GOALS: SHORT TERM GOALS: Target date: 01/17/2022   Patient will be independent with HEP for PT progression. Baseline: initial HEP addressed Status 01/17/22: independent with initial HEP Goal status: MET     LONG TERM GOALS: Target date: 02/28/2022   Patient will score 56 on FOTO. Baseline: 45 Goal status: INITIAL   2.  Patient will demonstrate </= 170 degrees right shoulder flexion and abduction AROM to improve ability to reach for items overhead in kitchen cabinet.  Baseline: unable to perform Goal status: INITIAL   3.  Patient will demonstrate 90 degrees right shoulder ER AROM to improve ability to reach [facility] without limitations. Baseline: unable to perform Goal status: INITIAL   4.  Patient will demonstrate improved erect posture to assist with reaching [place] without limitations.  Baseline: rounded shoulders Goal status: INITIAL   5.  Patient will demonstrate >/= 4/5 right shoulder MMT to perform ADLs at home.  Baseline: unable to assess due to surgical protocol Goal status: INITIAL    PLAN: PT FREQUENCY: 1-2x/week   PT DURATION: 8 weeks   PLANNED INTERVENTIONS: Therapeutic exercises, Therapeutic activity, Neuromuscular re-education, Patient/Family  education, Joint mobilization, Dry Needling, Electrical stimulation, Cryotherapy, Moist heat, scar mobilization, Taping, and Manual therapy   PLAN FOR NEXT SESSION: PROM, AAROM, begin isometrics and AAROM IR, cont manual for trigger point release and scap mobs if beneficial.    Hessie Diener, PTA 02/23/22 2:21 PM Phone: [phone] Fax: [phone]

## 2022-02-24 NOTE — Therapy (Signed)
OUTPATIENT PHYSICAL THERAPY TREATMENT NOTE   Patient Name: Larry Cruz MRN: [id number] DOB:09-23-1970, 51 y.o., male Today's Date: 02/24/2022  PCP: Rita Ohara, MD REFERRING PROVIDER: Rita Ohara, MD       Past Medical History:  Diagnosis Date   Aseptic meningitis    Colorblind    Dyslipidemia    Headache    Herpes genitalis in men    Hx of elbow surgery 09/02/2019   right elbow percutaneous ECRB release/lateral elbow percutaneous release-Dr.Gramig   Hyperlipidemia    Lateral epicondylitis of right elbow 03/2018   s/p injection by Dr. Micheline Chapman; MRI 10/2018, f/b eval by Dr. Amedeo Plenty   Low back pain    Meningitis 2019   negative cultures   Torn tendon    R elbow   Past Surgical History:  Procedure Laterality Date   CERVICAL DISC SURGERY  2009   JONES, MD; fusion   ELBOW SURGERY  09/02/2019   right elbow percutaneous ECRB release/lateral elbow percutaneous release-Dr. [place] Right    percutaneous--done by Dr. Amedeo Plenty in either 08/2019 or 09/2019   LUMBAR DISC SURGERY  2000   CARTER, MD; L4-L5 (right)   [place] SUR[facility]ERY  11/2013   L4-5, Dr. Ronnald Ramp (left)   SHOULDER ARTHROSCOPY Right 12/30/2021   Procedure: ARTHROSCOPY SHOULDER/DEBRIDEMENT;  Surgeon: Hiram Gash, MD;  Location: [place];  Service: Orthopedics;  Laterality: Right;   SHOULDER ARTHROSCOPY WITH DISTAL CLAVICLE RESECTION Right 12/30/2021   Procedure: SHOULDER ARTHROSCOPY WITH DISTAL CLAVICLE EXCISION;  Surgeon: Hiram Gash, MD;  Location: [place];  Service: Orthopedics;  Laterality: Right;   SHOULDER ARTHROSCOPY WITH SUBACROMIAL DECOMPRESSION, ROTATOR CUFF REPAIR AND BICEP TENDON REPAIR Right 12/30/2021   Procedure: SHOULDER ARTHROSCOPY WITH SUBACROMIAL DECOMPRESSION, ROTATOR CUFF REPAIR AND BICEP TENDON REPAIR;  Surgeon: Hiram Gash, MD;  Location: [place];  Service: Orthopedics;  Laterality: Right;   VASECTOMY  11/09   Patient  Active Problem List   Diagnosis Date Noted   Mixed hyperlipidemia 12/21/2020   Intractable chronic migraine without aura and without status migrainosus 10/14/2018   Intractable headache 07/17/2018   Aseptic meningitis 04/23/2018   Left knee pain 12/08/2016   Plantar fasciitis of left foot 12/29/2015   Cavus deformity of foot 12/29/2015   Pure hypercholesterolemia 06/23/2011    REFERRING PROVIDER: Hiram Gash, MD   REFERRING DIAG: S/P right shoulder arthroscopy with subacromial decompression, distal clavicle excision, biceps  THERAPY DIAG:  No diagnosis found.  PERTINENT HISTORY: S/P right shoulder arthroscopy rotator cuff repiar with subacromial decompression, distal clavicle excision, biceps on 12/30/21  PRECAUTIONS: PROM right shoulder and elbow  SUBJECTIVE: Patient is 7 weeks and 6 days post op right rotator cuff repair. He reports pain progressively gets worse during the week with continued difficulty sleeping.     PAIN:  Are you having pain? Yes:  NPRS scale: -/10  Pain location: Right shoulder Pain description: Sharp, tight Relieving factors: Ice, medication Aggravating Factors: Shoulder movement  PATIENT GOALS "Pain free and full use of arm."   OBJECTIVE: (objective measures completed at initial evaluation unless otherwise dated) PATIENT SURVEYS:  FOTO 45, predicted 56   POSTURE: Rounded shoulders    UPPER EXTREMITY ROM:    Passive ROM Right 01/03/2022 Left 01/03/2022 Right  01/17/22 Right 01/26/22 Right 02/02/2022 Right 02/09/22 Right   02/16/22 Right  AROM 02/16/22 Right AROM 02/21/22 02/23/22  Shoulder flexion 90 PROM 170 AROM 95 PROM 110 PROM at table  95 PROM supine 100 deg PROM 110 AAROM supine 117 flexion AAROM/130 scaption AAROM 115 120 PROM 130  Shoulder extension              Shoulder abduction 95 PROM 170 AROM 60 Per protocol   60 deg PROM 75 PROM 90 PROM Scaption 105 Scaption 105   Shoulder adduction              Shoulder internal rotation 67  PROM 70 AROM      Reaches right upper gluteal Reaches upper right gluteal   Shoulder external rotation 46 PROM 90 AROM 20 PROM 12 PROM at neutral 35-40 deg @ 45 deg scapular plane PROM 30 degrees @ 45 degrees PROM 35 @ 45 degrees AAROM Reaches occiput 45@ 60 abduction  PROM 60 @ 75 degrees abduction  Elbow flexion       WFL       Elbow extension       WFL       (Blank rows = not tested)   UPPER EXTREMITY MMT:   MMT Right 01/03/2022 Left 01/03/2022  Shoulder flexion      Shoulder extension      Shoulder abduction      Shoulder adduction      Shoulder internal rotation      Shoulder external rotation      Middle trapezius      Lower trapezius      Elbow flexion      Elbow extension      Wrist flexion      Wrist extension      Wrist ulnar deviation      Wrist radial deviation      Wrist pronation      Wrist supination      Grip strength (lbs)      (Blank rows = not tested)             **deterred due to protocol   PALPATION:  TTP over incision over anterior right shoulder and biceps              TODAY'S TREATMENT:  [facility] Adult PT Treatment:                                                DATE: 02/28/2022 Therapeutic Exercise: Pulleys flexion, scaption, horizontals Manual Therapy: Gentle oscillations to GHJ for pain reduction, grade 2,3 A/P and inferior glides  TPR right upper trap, sub scap, pec, STW  to deltoid Sidelying scapular mobs , supine passive protraction PROM right shoulder flexion, abduction, ER, IR to tolerance, various planes Vasopneumatic (Game Ready): (not billed) Location: Right shoulder Time: 15 min Temperature: 34 deg Pressure: Medium   [facility] Adult PT Treatment:                                                DATE: 02/23/22 Therapeutic Exercise: Pulleys flexion, scaption, horizontals Manual Therapy: Gentle oscillations to GHJ for pain reduction, grade 2,3 A/P and inferior glides  TPR right upper trap, sub scap, pec, STW  to deltoid Sidelying scapular  mobs , supine passive protraction PROM right shoulder flexion, abduction, ER, IR to tolerance, various planes Vasopneumatic (Game Ready): (not billed) Location: Right shoulder Time: 15  min Temperature: 34 deg Pressure: Medium  [facility] Adult PT Treatment:                                                DATE: 02/21/2022 Manual Therapy: Gentle oscillations to GHJ for pain reduction, grade 1,2 A/P and inferior glides  PROM right shoulder flexion, abduction, ER, IR to tolerance Therapeutic Exercise Pulleys flexion  x 2 minutes Pulleys scaption x 1 minute AAROM standing UE ranger flexion x 10  Standing UE ranger ER AAROM Supine AAROM chest press- UE ranger Supine pullovers with UE ranger Supine horizontals with UE ranger Supine AAROM ER at 45 degrees with UE ranger Vasopneumatic (Game Ready): (not billed) Location: Right shoulder Time: 15 min Temperature: 34 deg Pressure: Medium  [facility] Adult PT Treatment:                                                DATE: 02/16/2022 Manual Therapy: Gentle oscillations to GHJ for pain reduction, grade 1,2 A/P and inferior glides  PROM right shoulder flexion in scapular plane and ER at  45 deg in scapular plane, abduction to 90, IR from 45 degrees abduction to stomach.  Therapeutic Exercise Supine AAROM chest press- hands clasped Supine pullovers with wooden dowel Supine horizontals with wooden dowel Supine AAROM ER at 45 degrees Pulleys flexion  x 2 minutes Pulleys scaption x 1 minute AROM Vasopneumatic (Game Ready): (not billed) Location: Right shoulder Time: 15 min Temperature: 34 deg Pressure: Medium  [facility] Adult PT Treatment:                                                DATE: 02/09/2022 Manual Therapy: Gentle oscillations to GHJ for pain reduction, grade 1,2 A/P and inferior glides  PROM right shoulder flexion in scapular plane and ER at  45 deg in scapular plane, abduction to 75, IR from 45 degrees abduction to stomach.  Therapeutic  Exercise Supine AAROM chest press- hands clasped Vasopneumatic (Game Ready): (not billed) Location: Right shoulder Time: 15 min Temperature: 34 deg Pressure: Medium  [facility] Adult PT Treatment:                                                DATE: 02/02/2022 Therapeutic Exercise Standing shoulder flexion step back stretch x 10 Manual Therapy: PROM right shoulder flexion in scapular plane and ER at 0 and 45 deg in scapular plane PROM right elbow flexion and extension Vasopneumatic (Game Ready): (not billed) Location: Right shoulder Time: 15 min Temperature: 34 deg Pressure: Medium  [facility] Adult PT Treatment:                                                DATE: 01/26/2022 Therapeutic Exercise: Standing shoulder flexion step back stretch x 10 ER PROM using wooden dowel 30 sec x  5  Supine hand clasped shoulder flexion PROM with elbows bent x 10 Manual Therapy: Gentle oscillations to GHJ for pain reduction, grade 1,2 A/P and inferior glides  PROM right shoulder flexion, abduction in scapular plane to 60 and ER at 0 - see chart for measurements Modalities:  Cryotherapy Right shoulder Supine x 15 minutes  IFC 15 mA concurrent with Cryotherapy , right shoulder  Self Care:  Consider increased frequency of ice applications/ consider TENS purchase for pain management if IFC helpful in clinic.   [facility] Adult PT Treatment:                                                DATE: 01/17/2022 Therapeutic Exercise: ER PROM using wooden dowel 10 sec x 10 Supine hand clasped shoulder flexion PROM with elbows bent x 10 Seated shoulder flexion table slide x 10 Standing shoulder flexion step back stretch x 10 Manual Therapy: PROM right shoulder flexion, abduction in scapular plane and ER at 0  Soft tissue mobilization right pectoralis, right upper trap Modalities:  Cryotherapy Right shoulder Supine x 15 minutes     PATIENT EDUCATION: Education details: HEP Person educated: Patient Education method:  Consulting civil engineer, Media planner, Corporate treasurer cues, Verbal cues Education comprehension: verbalized understanding   HOME EXERCISE PROGRAM: Access Code: [email or web address]    ASSESSMENT: CLINICAL IMPRESSION: Patient tolerated therapy well with no adverse effects. ***  Therapy focused primarily on AAROM and PROM to progress shoulder mobility as allowed per protocol. Worked on scapular mobs and Trigger point release to decrease restrictions and tension.  PROM continues to improve.  Patient would benefit from continued skilled PT to progress as protocol allows in order to maximize functional ability. Can begin isometrics next visit.      OBJECTIVE IMPAIRMENTS decreased ROM, decreased strength, impaired UE functional use, and pain.    ACTIVITY LIMITATIONS cleaning, meal prep, occupation, and yard work.    PERSONAL FACTORS Time since onset of injury/illness/exacerbation and 1 comorbidity: hyperlipidemia  are also affecting patient's functional outcome.      GOALS: SHORT TERM GOALS: Target date: 01/17/2022   Patient will be independent with HEP for PT progression. Baseline: initial HEP addressed Status 01/17/22: independent with initial HEP Goal status: MET     LONG TERM GOALS: Target date: 02/28/2022   Patient will score 56 on FOTO. Baseline: 45 Goal status: INITIAL   2.  Patient will demonstrate </= 170 degrees right shoulder flexion and abduction AROM to improve ability to reach for items overhead in kitchen cabinet.  Baseline: unable to perform Goal status: INITIAL   3.  Patient will demonstrate 90 degrees right shoulder ER AROM to improve ability to reach [facility] without limitations. Baseline: unable to perform Goal status: INITIAL   4.  Patient will demonstrate improved erect posture to assist with reaching [place] without limitations.  Baseline: rounded shoulders Goal status: INITIAL   5.  Patient will demonstrate >/= 4/5 right shoulder MMT to perform ADLs at home.  Baseline: unable to assess due to  surgical protocol Goal status: INITIAL    PLAN: PT FREQUENCY: 1-2x/week   PT DURATION: 8 weeks   PLANNED INTERVENTIONS: Therapeutic exercises, Therapeutic activity, Neuromuscular re-education, Patient/Family education, Joint mobilization, Dry Needling, Electrical stimulation, Cryotherapy, Moist heat, scar mobilization, Taping, and Manual therapy   PLAN FOR NEXT SESSION: PROM, AAROM, begin isometrics and AAROM IR, cont manual for  trigger point release and scap mobs if beneficial.    Hilda Blades, PT, DPT, LAT, ATC 02/24/22  10:23 AM Phone: [phone] Fax: [phone]

## 2022-02-28 ENCOUNTER — Encounter: Payer: Self-pay | Admitting: Physical Therapy

## 2022-02-28 ENCOUNTER — Other Ambulatory Visit: Payer: Self-pay

## 2022-02-28 ENCOUNTER — Ambulatory Visit: Payer: 59 | Admitting: Physical Therapy

## 2022-02-28 DIAGNOSIS — M6281 Muscle weakness (generalized): Secondary | ICD-10-CM

## 2022-02-28 DIAGNOSIS — R293 Abnormal posture: Secondary | ICD-10-CM

## 2022-02-28 DIAGNOSIS — M25511 Pain in right shoulder: Secondary | ICD-10-CM | POA: Diagnosis not present

## 2022-02-28 NOTE — Therapy (Signed)
OUTPATIENT PHYSICAL THERAPY TREATMENT NOTE    Patient Name: Larry Cruz MRN: [id number] DOB:December 27, 1970, 51 y.o., male Today's Date: 03/02/2022  PCP: Rita Ohara, MD REFERRING PROVIDER: Rita Ohara, MD   PT End of Session - 03/02/22 1331     Visit Number 11    Number of Visits 26    Date for PT Re-Evaluation 04/25/22    Authorization Type [employer]    Progress Note Due on Visit 20    PT Start Time 1215    PT Stop Time 1300    PT Time Calculation (min) 45 min    Activity Tolerance Patient tolerated treatment well    Behavior During Therapy [facility] for tasks assessed/performed                 Past Medical History:  Diagnosis Date   Aseptic meningitis    Colorblind    Dyslipidemia    Headache    Herpes genitalis in men    Hx of elbow surgery 09/02/2019   right elbow percutaneous ECRB release/lateral elbow percutaneous release-Dr.Gramig   Hyperlipidemia    Lateral epicondylitis of right elbow 03/2018   s/p injection by Dr. Micheline Chapman; MRI 10/2018, f/b eval by Dr. Amedeo Plenty   Low back pain    Meningitis 2019   negative cultures   Torn tendon    R elbow   Past Surgical History:  Procedure Laterality Date   CERVICAL DISC SURGERY  2009   JONES, MD; fusion   ELBOW SURGERY  09/02/2019   right elbow percutaneous ECRB release/lateral elbow percutaneous release-Dr. [place] Right    percutaneous--done by Dr. Amedeo Plenty in either 08/2019 or 09/2019   LUMBAR DISC SURGERY  2000   CARTER, MD; L4-L5 (right)   LUMBAR DISC SURGERY  11/2013   L4-5, Dr. Ronnald Ramp (left)   SHOULDER ARTHROSCOPY Right 12/30/2021   Procedure: ARTHROSCOPY SHOULDER/DEBRIDEMENT;  Surgeon: Hiram Gash, MD;  Location: [place];  Service: Orthopedics;  Laterality: Right;   SHOULDER ARTHROSCOPY WITH DISTAL CLAVICLE RESECTION Right 12/30/2021   Procedure: SHOULDER ARTHROSCOPY WITH DISTAL CLAVICLE EXCISION;  Surgeon: Hiram Gash, MD;  Location: [place];   Service: Orthopedics;  Laterality: Right;   SHOULDER ARTHROSCOPY WITH SUBACROMIAL DECOMPRESSION, ROTATOR CUFF REPAIR AND BICEP TENDON REPAIR Right 12/30/2021   Procedure: SHOULDER ARTHROSCOPY WITH SUBACROMIAL DECOMPRESSION, ROTATOR CUFF REPAIR AND BICEP TENDON REPAIR;  Surgeon: Hiram Gash, MD;  Location: [place];  Service: Orthopedics;  Laterality: Right;   VASECTOMY  11/09   Patient Active Problem List   Diagnosis Date Noted   Mixed hyperlipidemia 12/21/2020   Intractable chronic migraine without aura and without status migrainosus 10/14/2018   Intractable headache 07/17/2018   Aseptic meningitis 04/23/2018   Left knee pain 12/08/2016   Plantar fasciitis of left foot 12/29/2015   Cavus deformity of foot 12/29/2015   Pure hypercholesterolemia 06/23/2011    REFERRING PROVIDER: Hiram Gash, MD   REFERRING DIAG: S/P right shoulder arthroscopy with subacromial decompression, distal clavicle excision, biceps  THERAPY DIAG:  Right shoulder pain, unspecified chronicity  Abnormal posture  Muscle weakness (generalized)  PERTINENT HISTORY: S/P right shoulder arthroscopy rotator cuff repiar with subacromial decompression, distal clavicle excision, biceps on 12/30/21  PRECAUTIONS: see protocol  SUBJECTIVE: Patient reports shoulder pain about the same. Overall he notes improvement over the past 1-2 weeks.  PAIN:  Are you having pain? Yes:  NPRS scale: 2/10  Pain location: Right shoulder Pain  description: Sharp, tight Relieving factors: Ice, medication Aggravating Factors: Shoulder movement  PATIENT GOALS "Pain free and full use of arm."   OBJECTIVE: (objective measures completed at initial evaluation unless otherwise dated) PATIENT SURVEYS:  FOTO 45, predicted 56   POSTURE: Rounded shoulders    UPPER EXTREMITY ROM:    Passive ROM Right 01/03/2022 Left 01/03/2022 Right  01/17/22 Right 01/26/22 Right 02/02/2022 Right 02/09/22 Right   02/16/22 Right   AROM 02/16/22 Right AROM 02/21/22 02/23/22 02/28/2022  Shoulder flexion 90 PROM 170 AROM 95 PROM 110 PROM at table  95 PROM supine 100 deg PROM 110 AAROM supine 117 flexion AAROM/130 scaption AAROM 115 120 PROM 130 PROM 150 deg  Shoulder extension               Shoulder abduction 95 PROM 170 AROM 60 Per protocol   60 deg PROM 75 PROM 90 PROM Scaption 105 Scaption 105    Shoulder adduction               Shoulder internal rotation 67 PROM 70 AROM      Reaches right upper gluteal Reaches upper right gluteal    Shoulder external rotation 46 PROM 90 AROM 20 PROM 12 PROM at neutral 35-40 deg @ 45 deg scapular plane PROM 30 degrees @ 45 degrees PROM 35 @ 45 degrees AAROM Reaches occiput 45@ 60 abduction  PROM 60 @ 75 degrees abduction PROM 70 deg  Elbow flexion       WFL        Elbow extension       WFL        (Blank rows = not tested)   UPPER EXTREMITY MMT:   MMT Right 01/03/2022 Left 01/03/2022  Shoulder flexion      Shoulder extension      Shoulder abduction      Shoulder adduction      Shoulder internal rotation      Shoulder external rotation      Middle trapezius      Lower trapezius      Elbow flexion      Elbow extension      Wrist flexion      Wrist extension      Wrist ulnar deviation      Wrist radial deviation      Wrist pronation      Wrist supination      Grip strength (lbs)      (Blank rows = not tested)             **deterred due to protocol   PALPATION:  TTP over incision over anterior right shoulder and biceps              TODAY'S TREATMENT:  [facility] Adult PT Treatment:                                                DATE: 03/02/2022 Therapeutic Exercise: Supine shoulder flexion AAROM with dowel x 10 Standing shoulder elevation AAROM with dowel x 10 Doorway shoulder ER at neutral stretch x 3 Manual Therapy: Supine GHJ mobs primarily in posterior and inferior directions at various ranges of shoulder elevation Prone STM / MFR to posterior cuff and upper trap Pin  and stretch release for bicep and medial/lateral forearm musculature PROM right shoulder all directions as tolerated   [facility] Adult PT  Treatment:                                                DATE: 02/28/2022 Therapeutic Exercise: Supine shoulder flexion with dowel x 10 Standing shoulder extension with dowel  x 10 Standing shoulder IR behind back with dowel x 10 Manual Therapy: Supine GHJ mobs primarily in posterior and inferior directions at various ranges of shoulder elevation Sidelying scapulothoracic mobs all directions Prone STM / MFR to posterior cuff and upper trap PROM right shoulder all directions as tolerated Vasopneumatic (Game Ready): (not billed) Location: Right shoulder Time: 15 min Temperature: 34 deg Pressure: Medium  [facility] Adult PT Treatment:                                                DATE: 02/23/22 Therapeutic Exercise: Pulleys flexion, scaption, horizontals Manual Therapy: Gentle oscillations to GHJ for pain reduction, grade 2,3 A/P and inferior glides  TPR right upper trap, sub scap, pec, STW  to deltoid Sidelying scapular mobs , supine passive protraction PROM right shoulder flexion, abduction, ER, IR to tolerance, various planes Vasopneumatic (Game Ready): (not billed) Location: Right shoulder Time: 15 min Temperature: 34 deg Pressure: Medium  [facility] Adult PT Treatment:                                                DATE: 02/21/2022 Manual Therapy: Gentle oscillations to GHJ for pain reduction, grade 1,2 A/P and inferior glides  PROM right shoulder flexion, abduction, ER, IR to tolerance Therapeutic Exercise Pulleys flexion  x 2 minutes Pulleys scaption x 1 minute AAROM standing UE ranger flexion x 10  Standing UE ranger ER AAROM Supine AAROM chest press- UE ranger Supine pullovers with UE ranger Supine horizontals with UE ranger Supine AAROM ER at 45 degrees with UE ranger Vasopneumatic (Game Ready): (not billed) Location: Right shoulder Time: 15  min Temperature: 34 deg Pressure: Medium  [facility] Adult PT Treatment:                                                DATE: 02/16/2022 Manual Therapy: Gentle oscillations to GHJ for pain reduction, grade 1,2 A/P and inferior glides  PROM right shoulder flexion in scapular plane and ER at  45 deg in scapular plane, abduction to 90, IR from 45 degrees abduction to stomach.  Therapeutic Exercise Supine AAROM chest press- hands clasped Supine pullovers with wooden dowel Supine horizontals with wooden dowel Supine AAROM ER at 45 degrees Pulleys flexion  x 2 minutes Pulleys scaption x 1 minute AROM Vasopneumatic (Game Ready): (not billed) Location: Right shoulder Time: 15 min Temperature: 34 deg Pressure: Medium    PATIENT EDUCATION: Education details: HEP Person educated: Patient Education method: Consulting civil engineer, Demonstration, Corporate treasurer cues, Verbal cues Education comprehension: verbalized understanding   HOME EXERCISE PROGRAM: Access Code: [email or web address]    ASSESSMENT: CLINICAL IMPRESSION: Patient tolerated therapy well with no adverse effects. Continued with manual therapy for the right  shoulder, bicep, and forearm to reduce pain and improve mobility. He continues to demonstrate limitation in right shoulder mobility with increased pain in all directions of shoulder motion. Overall he is making gradual progress with right shoulder. No changes to HEP, he was provided with tennis ball for SMFR to posterior cuff region. Patient would benefit from continued skilled PT to reduce pain and progress his shoulder mobility and active motion as protocol allows in order to maximize functional ability.     OBJECTIVE IMPAIRMENTS decreased ROM, decreased strength, impaired UE functional use, and pain.    ACTIVITY LIMITATIONS cleaning, meal prep, occupation, and yard work.    PERSONAL FACTORS Time since onset of injury/illness/exacerbation and 1 comorbidity: hyperlipidemia  are also affecting patient's  functional outcome.      GOALS: SHORT TERM GOALS: Target date: 01/17/2022   Patient will be independent with HEP for PT progression. Baseline: initial HEP addressed Status 01/17/22: independent with initial HEP Goal status: MET     LONG TERM GOALS: Target date: 04/25/2022   Patient will score 56 on FOTO. Baseline: 45 02/28/2022: not assessed Goal status: DEFERRED TO NEXT VISIT   2.  Patient will demonstrate >/= 170 degrees right shoulder flexion and abduction AROM to improve ability to reach for items overhead in kitchen cabinet.  Baseline: unable to perform 02/28/2022: approx 150 deg PROM Goal status: ONGOING   3.  Patient will demonstrate 90 degrees right shoulder ER AROM to improve ability to reach [facility] without limitations. Baseline: unable to perform 02/28/2022: approx 70 deg PROM Goal status: ONGOING   4.  Patient will demonstrate improved erect posture to assist with reaching [place] without limitations.  Baseline: rounded shoulders 02/28/2022: rounded shoulders  Goal status: ONGOING   5.  Patient will demonstrate >/= 4/5 right shoulder MMT to perform ADLs at home.  Baseline: unable to assess due to surgical protocol 02/28/2022: not assessed per protocol Goal status: DEFERRED    PLAN: PT FREQUENCY: 1-2x/week   PT DURATION: 8 weeks   PLANNED INTERVENTIONS: Therapeutic exercises, Therapeutic activity, Neuromuscular re-education, Patient/Family education, Joint mobilization, Dry Needling, Electrical stimulation, Cryotherapy, Moist heat, scar mobilization, Taping, and Manual therapy   PLAN FOR NEXT SESSION: Manual bicep/pec and forearm, continue manual posterior cuff, PROM, AAROM, begin isometrics and AAROM IR   Hilda Blades, PT, DPT, LAT, ATC 03/02/22  1:32 PM Phone: [phone] Fax: [phone]

## 2022-03-02 ENCOUNTER — Ambulatory Visit: Payer: 59 | Admitting: Physical Therapy

## 2022-03-02 ENCOUNTER — Encounter: Payer: Self-pay | Admitting: Physical Therapy

## 2022-03-02 ENCOUNTER — Other Ambulatory Visit: Payer: Self-pay

## 2022-03-02 DIAGNOSIS — M6281 Muscle weakness (generalized): Secondary | ICD-10-CM

## 2022-03-02 DIAGNOSIS — R293 Abnormal posture: Secondary | ICD-10-CM

## 2022-03-02 DIAGNOSIS — M25511 Pain in right shoulder: Secondary | ICD-10-CM

## 2022-03-14 ENCOUNTER — Encounter: Payer: Self-pay | Admitting: Physical Therapy

## 2022-03-14 ENCOUNTER — Ambulatory Visit: Payer: 59 | Admitting: Physical Therapy

## 2022-03-14 ENCOUNTER — Other Ambulatory Visit: Payer: Self-pay

## 2022-03-14 DIAGNOSIS — M25511 Pain in right shoulder: Secondary | ICD-10-CM | POA: Diagnosis not present

## 2022-03-14 DIAGNOSIS — M6281 Muscle weakness (generalized): Secondary | ICD-10-CM

## 2022-03-14 DIAGNOSIS — R293 Abnormal posture: Secondary | ICD-10-CM | POA: Diagnosis not present

## 2022-03-16 ENCOUNTER — Encounter: Payer: Self-pay | Admitting: Physical Therapy

## 2022-03-16 ENCOUNTER — Ambulatory Visit: Payer: 59 | Admitting: Physical Therapy

## 2022-03-16 ENCOUNTER — Other Ambulatory Visit: Payer: Self-pay

## 2022-03-16 DIAGNOSIS — R293 Abnormal posture: Secondary | ICD-10-CM

## 2022-03-16 DIAGNOSIS — M6281 Muscle weakness (generalized): Secondary | ICD-10-CM | POA: Diagnosis not present

## 2022-03-16 DIAGNOSIS — M25511 Pain in right shoulder: Secondary | ICD-10-CM | POA: Diagnosis not present

## 2022-03-16 NOTE — Therapy (Signed)
OUTPATIENT PHYSICAL THERAPY TREATMENT NOTE    Patient Name: Larry Cruz MRN: [id number] DOB:[birth year], 51 y.o., male Today's Date: 03/16/2022  PCP: Rita Ohara, MD REFERRING PROVIDER: Rita Ohara, MD   PT End of Session - 03/16/22 1424     Visit Number 13    Number of Visits 26    Date for PT Re-Evaluation 04/25/22    Authorization Type [employer]    Progress Note Due on Visit 20    PT Start Time 1400    PT Stop Time 1500    PT Time Calculation (min) 60 min    Activity Tolerance Patient tolerated treatment well    Behavior During Therapy [facility] for tasks assessed/performed                   Past Medical History:  Diagnosis Date   Aseptic meningitis    Colorblind    Dyslipidemia    Headache    Herpes genitalis in men    Hx of elbow surgery 09/02/2019   right elbow percutaneous ECRB release/lateral elbow percutaneous release-Dr.Gramig   Hyperlipidemia    Lateral epicondylitis of right elbow 03/2018   s/p injection by Dr. Micheline Chapman; MRI 10/2018, f/b eval by Dr. Amedeo Plenty   Low back pain    Meningitis 2019   negative cultures   Torn tendon    R elbow   Past Surgical History:  Procedure Laterality Date   CERVICAL DISC SURGERY  2009   JONES, MD; fusion   ELBOW SURGERY  09/02/2019   right elbow percutaneous ECRB release/lateral elbow percutaneous release-Dr. [place] Right    percutaneous--done by Dr. Amedeo Plenty in either 08/2019 or 09/2019   LUMBAR DISC SURGERY  2000   CARTER, MD; L4-L5 (right)   LUMBAR DISC SURGERY  11/2013   L4-5, Dr. Ronnald Ramp (left)   SHOULDER ARTHROSCOPY Right 12/30/2021   Procedure: ARTHROSCOPY SHOULDER/DEBRIDEMENT;  Surgeon: Hiram Gash, MD;  Location: [place];  Service: Orthopedics;  Laterality: Right;   SHOULDER ARTHROSCOPY WITH DISTAL CLAVICLE RESECTION Right 12/30/2021   Procedure: SHOULDER ARTHROSCOPY WITH DISTAL CLAVICLE EXCISION;  Surgeon: Hiram Gash, MD;  Location: [place];  Service: Orthopedics;  Laterality: Right;   SHOULDER ARTHROSCOPY WITH SUBACROMIAL DECOMPRESSION, ROTATOR CUFF REPAIR AND BICEP TENDON REPAIR Right 12/30/2021   Procedure: SHOULDER ARTHROSCOPY WITH SUBACROMIAL DECOMPRESSION, ROTATOR CUFF REPAIR AND BICEP TENDON REPAIR;  Surgeon: Hiram Gash, MD;  Location: [place];  Service: Orthopedics;  Laterality: Right;   VASECTOMY  11/09   Patient Active Problem List   Diagnosis Date Noted   Mixed hyperlipidemia 12/21/2020   Intractable chronic migraine without aura and without status migrainosus 10/14/2018   Intractable headache 07/17/2018   Aseptic meningitis 04/23/2018   Left knee pain 12/08/2016   Plantar fasciitis of left foot 12/29/2015   Cavus deformity of foot 12/29/2015   Pure hypercholesterolemia 06/23/2011    REFERRING PROVIDER: Hiram Gash, MD   REFERRING DIAG: S/P right shoulder arthroscopy with subacromial decompression, distal clavicle excision, biceps  THERAPY DIAG:  Right shoulder pain, unspecified chronicity  Abnormal posture  Muscle weakness (generalized)  PERTINENT HISTORY: S/P right shoulder arthroscopy rotator cuff repiar with subacromial decompression, distal clavicle excision, biceps on 12/30/21  PRECAUTIONS: see protocol  SUBJECTIVE: Patient reports he is doing alright.   PAIN:  Are you having pain? Yes:  NPRS scale: 1/10  Pain location: Right shoulder Pain description: Sharp, tight Relieving factors: Ice, medication  Aggravating Factors: Shoulder movement  PATIENT GOALS "Pain free and full use of arm."   OBJECTIVE: (objective measures completed at initial evaluation unless otherwise dated) PATIENT SURVEYS:  FOTO 45, predicted 56   POSTURE: Rounded shoulders    UPPER EXTREMITY ROM:    Passive ROM Right 01/03/2022 Left 01/03/2022 Right  01/17/22 Right 01/26/22 Right 02/02/2022 Right 02/09/22 Right   02/16/22 Right  AROM 02/16/22 Right AROM 02/21/22 02/23/22 02/28/2022   Shoulder flexion 90 PROM 170 AROM 95 PROM 110 PROM at table  95 PROM supine 100 deg PROM 110 AAROM supine 117 flexion AAROM/130 scaption AAROM 115 120 PROM 130 PROM 150 deg  Shoulder extension               Shoulder abduction 95 PROM 170 AROM 60 Per protocol   60 deg PROM 75 PROM 90 PROM Scaption 105 Scaption 105    Shoulder adduction               Shoulder internal rotation 67 PROM 70 AROM      Reaches right upper gluteal Reaches upper right gluteal    Shoulder external rotation 46 PROM 90 AROM 20 PROM 12 PROM at neutral 35-40 deg @ 45 deg scapular plane PROM 30 degrees @ 45 degrees PROM 35 @ 45 degrees AAROM Reaches occiput 45@ 60 abduction  PROM 60 @ 75 degrees abduction PROM 70 deg  Elbow flexion       WFL        Elbow extension       WFL        (Blank rows = not tested)   UPPER EXTREMITY MMT:   MMT Right 01/03/2022 Left 01/03/2022  Shoulder flexion      Shoulder extension      Shoulder abduction      Shoulder adduction      Shoulder internal rotation      Shoulder external rotation      Middle trapezius      Lower trapezius      Elbow flexion      Elbow extension      Wrist flexion      Wrist extension      Wrist ulnar deviation      Wrist radial deviation      Wrist pronation      Wrist supination      Grip strength (lbs)      (Blank rows = not tested)             **deterred due to protocol   PALPATION:  TTP over incision over anterior right shoulder and biceps              TODAY'S TREATMENT:  [facility] Adult PT Treatment:                                                DATE: 03/16/2022 Therapeutic Exercise: Supine shoulder flexion AROM x 10, with 1# x 10, with 2# x 10 Supine scapular protraction/retraction with 2# 2 x 10 Sidelying shoulder ER with 1# x 15 Sidelying shoulder abduction 2 x 10 Prone row with 2# 2 x 10 Standing ER with yellow x 15 Standing wall slide flexion x10 Ranger right shoulder flexion AAROM x 10 Standing shoulder IR across back with towel x  10 Manual Therapy: Supine GHJ mobs primarily in posterior and inferior directions  at various ranges of shoulder elevation Sidelying scapular mobs all directions PROM right shoulder all directions as tolerated Vasopneumatic (Game Ready): (not billed) Location: Right shoulder Time: 15 min Temperature: 34 deg Pressure: Medium   [facility] Adult PT Treatment:                                                DATE: 03/14/2022 Therapeutic Exercise: Supine shoulder flexion AAROM with dowel and 2# x 10 Supine shoulder flexion AAROM with physioball x 10 Prone row x 10 Prone shoulder extension and scap retraction x 10 Prone horizontal abduction x 10 Sidelying shoulder ER x 15 Sidelying shoulder abduction x 10 Supine scapular protraction/retraction x 10 Standing wall slide flexion 10 x 5 sec Doorway shoulder ER / pec stretch at various ranges of shoulder elevation Manual Therapy: Supine GHJ mobs primarily in posterior and inferior directions at various ranges of shoulder elevation Sidelying scapular mobs all directions PROM right shoulder all directions as tolerated Vasopneumatic (Game Ready): (not billed) Location: Right shoulder Time: 15 min Temperature: 34 deg Pressure: Medium   [facility] Adult PT Treatment:                                                DATE: 03/02/2022 Therapeutic Exercise: Supine shoulder flexion AAROM with dowel x 10 Standing shoulder elevation AAROM with dowel x 10 Doorway shoulder ER at neutral stretch x 3 Manual Therapy: Supine GHJ mobs primarily in posterior and inferior directions at various ranges of shoulder elevation Prone STM / MFR to posterior cuff and upper trap Pin and stretch release for bicep and medial/lateral forearm musculature PROM right shoulder all directions as tolerated  [facility] Adult PT Treatment:                                                DATE: 02/28/2022 Therapeutic Exercise: Supine shoulder flexion with dowel x 10 Standing shoulder extension  with dowel  x 10 Standing shoulder IR behind back with dowel x 10 Manual Therapy: Supine GHJ mobs primarily in posterior and inferior directions at various ranges of shoulder elevation Sidelying scapulothoracic mobs all directions Prone STM / MFR to posterior cuff and upper trap PROM right shoulder all directions as tolerated Vasopneumatic (Game Ready): (not billed) Location: Right shoulder Time: 15 min Temperature: 34 deg Pressure: Medium    PATIENT EDUCATION: Education details: HEP Person educated: Patient Education method: Consulting civil engineer, Demonstration, Corporate treasurer cues, Verbal cues Education comprehension: verbalized understanding   HOME EXERCISE PROGRAM: Access Code: [email or web address]    ASSESSMENT: CLINICAL IMPRESSION: Patient tolerated therapy well with no adverse effects. Therapy continues to focus on on improving his shoulder mobility and progress his active motion. He is gradually improving with right shoulder PROM and AROM, and was able to tolerate addition of light weight/resistance to exercises to increase muscular activation. He does continue to report pain at end ranges of motion in all directions but overall pain seems to be improving. No changes to HEP this visit. Patient would benefit from continued skilled PT to reduce pain and progress his shoulder mobility and active motion as protocol allows  in order to maximize functional ability.     OBJECTIVE IMPAIRMENTS decreased ROM, decreased strength, impaired UE functional use, and pain.    ACTIVITY LIMITATIONS cleaning, meal prep, occupation, and yard work.    PERSONAL FACTORS Time since onset of injury/illness/exacerbation and 1 comorbidity: hyperlipidemia  are also affecting patient's functional outcome.      GOALS: SHORT TERM GOALS: Target date: 01/17/2022   Patient will be independent with HEP for PT progression. Baseline: initial HEP addressed Status 01/17/22: independent with initial HEP Goal status: MET     LONG TERM  GOALS: Target date: 04/25/2022   Patient will score 56 on FOTO. Baseline: 45 02/28/2022: not assessed Goal status: DEFERRED TO NEXT VISIT   2.  Patient will demonstrate >/= 170 degrees right shoulder flexion and abduction AROM to improve ability to reach for items overhead in kitchen cabinet.  Baseline: unable to perform 02/28/2022: approx 150 deg PROM Goal status: ONGOING   3.  Patient will demonstrate 90 degrees right shoulder ER AROM to improve ability to reach [facility] without limitations. Baseline: unable to perform 02/28/2022: approx 70 deg PROM Goal status: ONGOING   4.  Patient will demonstrate improved erect posture to assist with reaching [place] without limitations.  Baseline: rounded shoulders 02/28/2022: rounded shoulders  Goal status: ONGOING   5.  Patient will demonstrate >/= 4/5 right shoulder MMT to perform ADLs at home.  Baseline: unable to assess due to surgical protocol 02/28/2022: not assessed per protocol Goal status: DEFERRED    PLAN: PT FREQUENCY: 1-2x/week   PT DURATION: 8 weeks   PLANNED INTERVENTIONS: Therapeutic exercises, Therapeutic activity, Neuromuscular re-education, Patient/Family education, Joint mobilization, Dry Needling, Electrical stimulation, Cryotherapy, Moist heat, scar mobilization, Taping, and Manual therapy   PLAN FOR NEXT SESSION: Review HEP and progress PRN, continue manual and PROM to improve shoulder mobility, begin isometrics and progress AROM   Hilda Blades, PT, DPT, LAT, ATC 03/16/22  3:06 PM Phone: [phone] Fax: [phone]

## 2022-03-23 ENCOUNTER — Encounter: Payer: Self-pay | Admitting: Physical Therapy

## 2022-03-23 ENCOUNTER — Other Ambulatory Visit: Payer: Self-pay

## 2022-03-23 ENCOUNTER — Ambulatory Visit: Payer: 59 | Attending: Orthopaedic Surgery | Admitting: Physical Therapy

## 2022-03-23 DIAGNOSIS — R293 Abnormal posture: Secondary | ICD-10-CM | POA: Diagnosis not present

## 2022-03-23 DIAGNOSIS — M6281 Muscle weakness (generalized): Secondary | ICD-10-CM | POA: Insufficient documentation

## 2022-03-23 DIAGNOSIS — M25511 Pain in right shoulder: Secondary | ICD-10-CM | POA: Diagnosis not present

## 2022-03-23 NOTE — Therapy (Signed)
OUTPATIENT PHYSICAL THERAPY TREATMENT NOTE    Patient Name: Larry Cruz MRN: [id number] DOB:1971-05-26, 51 y.o., male Today's Date: 03/23/2022  PCP: Rita Ohara, MD REFERRING PROVIDER: Rita Ohara, MD     PT End of Session - 03/23/22 1614     Visit Number 14    Number of Visits 26    Date for PT Re-Evaluation 04/25/22    Authorization Type [employer]    Progress Note Due on Visit 20    PT Start Time 1400    PT Stop Time 1450    PT Time Calculation (min) 50 min    Activity Tolerance Patient tolerated treatment well    Behavior During Therapy [facility] for tasks assessed/performed                    Past Medical History:  Diagnosis Date   Aseptic meningitis    Colorblind    Dyslipidemia    Headache    Herpes genitalis in men    Hx of elbow surgery 09/02/2019   right elbow percutaneous ECRB release/lateral elbow percutaneous release-Dr.Gramig   Hyperlipidemia    Lateral epicondylitis of right elbow 03/2018   s/p injection by Dr. Micheline Chapman; MRI 10/2018, f/b eval by Dr. Amedeo Plenty   Low back pain    Meningitis 2019   negative cultures   Torn tendon    R elbow   Past Surgical History:  Procedure Laterality Date   CERVICAL DISC SURGERY  2009   JONES, MD; fusion   ELBOW SURGERY  09/02/2019   right elbow percutaneous ECRB release/lateral elbow percutaneous release-Dr. [place] Right    percutaneous--done by Dr. Amedeo Plenty in either 08/2019 or 09/2019   LUMBAR DISC SURGERY  2000   CARTER, MD; L4-L5 (right)   LUMBAR DISC SURGERY  11/2013   L4-5, Dr. Ronnald Ramp (left)   SHOULDER ARTHROSCOPY Right 12/30/2021   Procedure: ARTHROSCOPY SHOULDER/DEBRIDEMENT;  Surgeon: Hiram Gash, MD;  Location: [place];  Service: Orthopedics;  Laterality: Right;   SHOULDER ARTHROSCOPY WITH DISTAL CLAVICLE RESECTION Right 12/30/2021   Procedure: SHOULDER ARTHROSCOPY WITH DISTAL CLAVICLE EXCISION;  Surgeon: Hiram Gash, MD;  Location: [place];  Service: Orthopedics;  Laterality: Right;   SHOULDER ARTHROSCOPY WITH SUBACROMIAL DECOMPRESSION, ROTATOR CUFF REPAIR AND BICEP TENDON REPAIR Right 12/30/2021   Procedure: SHOULDER ARTHROSCOPY WITH SUBACROMIAL DECOMPRESSION, ROTATOR CUFF REPAIR AND BICEP TENDON REPAIR;  Surgeon: Hiram Gash, MD;  Location: [place];  Service: Orthopedics;  Laterality: Right;   VASECTOMY  11/09   Patient Active Problem List   Diagnosis Date Noted   Mixed hyperlipidemia 12/21/2020   Intractable chronic migraine without aura and without status migrainosus 10/14/2018   Intractable headache 07/17/2018   Aseptic meningitis 04/23/2018   Left knee pain 12/08/2016   Plantar fasciitis of left foot 12/29/2015   Cavus deformity of foot 12/29/2015   Pure hypercholesterolemia 06/23/2011    REFERRING PROVIDER: Hiram Gash, MD   REFERRING DIAG: S/P right shoulder arthroscopy with subacromial decompression, distal clavicle excision, biceps  THERAPY DIAG:  Right shoulder pain, unspecified chronicity  Abnormal posture  Muscle weakness (generalized)  PERTINENT HISTORY: S/P right shoulder arthroscopy rotator cuff repiar with subacromial decompression, distal clavicle excision, biceps on 12/30/21  PRECAUTIONS: see protocol  SUBJECTIVE: Patient reports he did   PAIN:  Are you having pain? Yes:  NPRS scale: 1/10  Pain location: Right shoulder Pain description: Sharp, tight Relieving factors: Ice,  medication Aggravating Factors: Shoulder movement  PATIENT GOALS "Pain free and full use of arm."   OBJECTIVE: (objective measures completed at initial evaluation unless otherwise dated) PATIENT SURVEYS:  FOTO 45, predicted 56   POSTURE: Rounded shoulders    UPPER EXTREMITY ROM:    Passive ROM Right 01/03/2022 Left 01/03/2022 Right  01/17/22 Right 01/26/22 Right 02/02/2022 Right 02/09/22 Right   02/16/22 Right  AROM 02/16/22 Right AROM 02/21/22 02/23/22 02/28/2022 03/23/2022  Shoulder  flexion 90 PROM 170 AROM 95 PROM 110 PROM at table  95 PROM supine 100 deg PROM 110 AAROM supine 117 flexion AAROM/130 scaption AAROM 115 120 PROM 130 PROM 150 deg PROM 155 deg  Shoulder extension                Shoulder abduction 95 PROM 170 AROM 60 Per protocol   60 deg PROM 75 PROM 90 PROM Scaption 105 Scaption 105     Shoulder adduction                Shoulder internal rotation 67 PROM 70 AROM      Reaches right upper gluteal Reaches upper right gluteal     Shoulder external rotation 46 PROM 90 AROM 20 PROM 12 PROM at neutral 35-40 deg @ 45 deg scapular plane PROM 30 degrees @ 45 degrees PROM 35 @ 45 degrees AAROM Reaches occiput 45@ 60 abduction  PROM 60 @ 75 degrees abduction PROM 70 deg   Elbow flexion       WFL         Elbow extension       WFL         (Blank rows = not tested)   UPPER EXTREMITY MMT:   MMT Right 01/03/2022 Left 01/03/2022  Shoulder flexion      Shoulder extension      Shoulder abduction      Shoulder adduction      Shoulder internal rotation      Shoulder external rotation      Middle trapezius      Lower trapezius      Elbow flexion      Elbow extension      Wrist flexion      Wrist extension      Wrist ulnar deviation      Wrist radial deviation      Wrist pronation      Wrist supination      Grip strength (lbs)      (Blank rows = not tested)             **deterred due to protocol   PALPATION:  TTP over incision over anterior right shoulder and biceps              TODAY'S TREATMENT:  [facility] Adult PT Treatment:                                                DATE: 03/23/2022 Therapeutic Exercise: Supine shoulder flexion with 2# x 10, 3# x 10 Supine shoulder rhythmic stabilization at 90 deg 2 x 30 sec Standing wall slide flexion x 10 Shoulder isometric flexion, extension, abduction, ER, IR 5 x 5 sec each Row with red 2 x 15 Standing IR/ER with red x 15 each Standing shoulder flexion with back against wall x 10 Step back stretch at counter 5 x 5  sec Manual Therapy: Supine GHJ mobs primarily in posterior and inferior directions at various ranges of shoulder elevation PROM right shoulder all directions as tolerated Vasopneumatic (Game Ready): (not billed) Location: Right shoulder Time: 15 min Temperature: 34 deg Pressure: Medium   [facility] Adult PT Treatment:                                                DATE: 03/16/2022 Therapeutic Exercise: Supine shoulder flexion AROM x 10, with 1# x 10, with 2# x 10 Supine scapular protraction/retraction with 2# 2 x 10 Sidelying shoulder ER with 1# x 15 Sidelying shoulder abduction 2 x 10 Prone row with 2# 2 x 10 Standing ER with yellow x 15 Standing wall slide flexion x10 Ranger right shoulder flexion AAROM x 10 Standing shoulder IR across back with towel x 10 Manual Therapy: Supine GHJ mobs primarily in posterior and inferior directions at various ranges of shoulder elevation Sidelying scapular mobs all directions PROM right shoulder all directions as tolerated Vasopneumatic (Game Ready): (not billed) Location: Right shoulder Time: 15 min Temperature: 34 deg Pressure: Medium  [facility] Adult PT Treatment:                                                DATE: 03/14/2022 Therapeutic Exercise: Supine shoulder flexion AAROM with dowel and 2# x 10 Supine shoulder flexion AAROM with physioball x 10 Prone row x 10 Prone shoulder extension and scap retraction x 10 Prone horizontal abduction x 10 Sidelying shoulder ER x 15 Sidelying shoulder abduction x 10 Supine scapular protraction/retraction x 10 Standing wall slide flexion 10 x 5 sec Doorway shoulder ER / pec stretch at various ranges of shoulder elevation Manual Therapy: Supine GHJ mobs primarily in posterior and inferior directions at various ranges of shoulder elevation Sidelying scapular mobs all directions PROM right shoulder all directions as tolerated Vasopneumatic (Game Ready): (not billed) Location: Right shoulder Time: 15  min Temperature: 34 deg Pressure: Medium    PATIENT EDUCATION: Education details: HEP update Person educated: Patient Education method: Explanation, Demonstration, Tactile cues, Verbal cues, Handout Education comprehension: verbalized understanding   HOME EXERCISE PROGRAM: Access Code: [email or web address]    ASSESSMENT: CLINICAL IMPRESSION: Patient tolerated therapy well with no adverse effects. Therapy continues to focus on progressing right shoulder mobility and introduction of light strengthening with good tolerance. He reports continued improvement in pain level with activity and demonstrates improved passive motion this visit. He does continue to exhibit gross limitations in shoulder mobility and AROM limited against gravity for flexion. Updated HEP with good tolerance. Patient would benefit from continued skilled PT to reduce pain and progress his shoulder mobility and active motion as protocol allows in order to maximize functional ability.     OBJECTIVE IMPAIRMENTS decreased ROM, decreased strength, impaired UE functional use, and pain.    ACTIVITY LIMITATIONS cleaning, meal prep, occupation, and yard work.    PERSONAL FACTORS Time since onset of injury/illness/exacerbation and 1 comorbidity: hyperlipidemia  are also affecting patient's functional outcome.      GOALS: SHORT TERM GOALS: Target date: 01/17/2022   Patient will be independent with HEP for PT progression. Baseline: initial HEP addressed Status 01/17/22: independent with initial HEP Goal status: MET  LONG TERM GOALS: Target date: 04/25/2022   Patient will score 56 on FOTO. Baseline: 45 02/28/2022: not assessed Goal status: DEFERRED TO NEXT VISIT   2.  Patient will demonstrate >/= 170 degrees right shoulder flexion and abduction AROM to improve ability to reach for items overhead in kitchen cabinet.  Baseline: unable to perform 02/28/2022: approx 150 deg PROM Goal status: ONGOING   3.  Patient will demonstrate 90  degrees right shoulder ER AROM to improve ability to reach [facility] without limitations. Baseline: unable to perform 02/28/2022: approx 70 deg PROM Goal status: ONGOING   4.  Patient will demonstrate improved erect posture to assist with reaching [place] without limitations.  Baseline: rounded shoulders 02/28/2022: rounded shoulders  Goal status: ONGOING   5.  Patient will demonstrate >/= 4/5 right shoulder MMT to perform ADLs at home.  Baseline: unable to assess due to surgical protocol 02/28/2022: not assessed per protocol Goal status: DEFERRED    PLAN: PT FREQUENCY: 1-2x/week   PT DURATION: 8 weeks   PLANNED INTERVENTIONS: Therapeutic exercises, Therapeutic activity, Neuromuscular re-education, Patient/Family education, Joint mobilization, Dry Needling, Electrical stimulation, Cryotherapy, Moist heat, scar mobilization, Taping, and Manual therapy   PLAN FOR NEXT SESSION: Review HEP and progress PRN, continue manual and PROM to improve shoulder mobility, begin isometrics and progress AROM   Hilda Blades, PT, DPT, LAT, ATC 03/23/22  4:15 PM Phone: [phone] Fax: [phone]

## 2022-03-23 NOTE — Patient Instructions (Signed)
Access Code: [email or web address] URL: [email or web address] Date: 03/23/2022 Prepared by: Hilda Blades  Exercises - Standing 'L' Stretch at Counter  - 2-3 x daily - 10 reps - 5 seconds hold - Standing shoulder flexion wall slides  - 2-3 x daily - 10 reps - 5 seconds hold - Supine Shoulder External Rotation with Dowel  - 1-2 x daily - 10 reps - 5 seconds hold - Doorway Pec Stretch at 60 Degrees Abduction with Arm Straight  - 1-2 x daily - 10 reps - 5 seconds hold - Single Arm Doorway Pec Stretch at 90 Degrees Abduction  - 1-2 x daily - 10 reps - 5 seconds hold - Supine Shoulder Flexion Extension Full Range AROM  - 1-2 x daily - 7 x weekly - 10-15 reps - Supine Bilateral Shoulder Protraction  - 1-2 x daily - 10-15 reps - Sidelying Shoulder Abduction Palm Forward  - 1-2 x daily - 10-15 reps - Shoulder Internal Rotation with Resistance  - 1 x daily - 3 sets - 10 reps - Shoulder External Rotation with Anchored Resistance  - 1 x daily - 3 sets - 10 reps - Standing Row with Anchored Resistance  - 1 x daily - 3 sets - 15 reps

## 2022-03-25 ENCOUNTER — Ambulatory Visit: Payer: 59 | Admitting: Physical Therapy

## 2022-03-25 ENCOUNTER — Other Ambulatory Visit: Payer: Self-pay

## 2022-03-25 ENCOUNTER — Encounter: Payer: Self-pay | Admitting: Physical Therapy

## 2022-03-25 DIAGNOSIS — M6281 Muscle weakness (generalized): Secondary | ICD-10-CM | POA: Diagnosis not present

## 2022-03-25 DIAGNOSIS — R293 Abnormal posture: Secondary | ICD-10-CM | POA: Diagnosis not present

## 2022-03-25 DIAGNOSIS — M25511 Pain in right shoulder: Secondary | ICD-10-CM | POA: Diagnosis not present

## 2022-03-25 NOTE — Therapy (Signed)
OUTPATIENT PHYSICAL THERAPY TREATMENT NOTE    Patient Name: Larry Cruz MRN: [id number] DOB:Oct 16, 1970, 51 y.o., male Today's Date: 03/25/2022  PCP: Rita Ohara, MD REFERRING PROVIDER: Hiram Gash, MD     PT End of Session - 03/25/22 1008     Visit Number 15    Number of Visits 26    Date for PT Re-Evaluation 04/25/22    Authorization Type [employer]    Progress Note Due on Visit 20    PT Start Time 1005    PT Stop Time 1100    PT Time Calculation (min) 55 min    Activity Tolerance Patient tolerated treatment well    Behavior During Therapy [facility] for tasks assessed/performed                     Past Medical History:  Diagnosis Date   Aseptic meningitis    Colorblind    Dyslipidemia    Headache    Herpes genitalis in men    Hx of elbow surgery 09/02/2019   right elbow percutaneous ECRB release/lateral elbow percutaneous release-Dr.Gramig   Hyperlipidemia    Lateral epicondylitis of right elbow 03/2018   s/p injection by Dr. Micheline Chapman; MRI 10/2018, f/b eval by Dr. Amedeo Plenty   Low back pain    Meningitis 2019   negative cultures   Torn tendon    R elbow   Past Surgical History:  Procedure Laterality Date   CERVICAL DISC SURGERY  2009   JONES, MD; fusion   ELBOW SURGERY  09/02/2019   right elbow percutaneous ECRB release/lateral elbow percutaneous release-Dr. [place] Right    percutaneous--done by Dr. Amedeo Plenty in either 08/2019 or 09/2019   LUMBAR DISC SURGERY  2000   CARTER, MD; L4-L5 (right)   LUMBAR DISC SURGERY  11/2013   L4-5, Dr. Ronnald Ramp (left)   SHOULDER ARTHROSCOPY Right 12/30/2021   Procedure: ARTHROSCOPY SHOULDER/DEBRIDEMENT;  Surgeon: Hiram Gash, MD;  Location: [place];  Service: Orthopedics;  Laterality: Right;   SHOULDER ARTHROSCOPY WITH DISTAL CLAVICLE RESECTION Right 12/30/2021   Procedure: SHOULDER ARTHROSCOPY WITH DISTAL CLAVICLE EXCISION;  Surgeon: Hiram Gash, MD;  Location: [place];  Service: Orthopedics;  Laterality: Right;   SHOULDER ARTHROSCOPY WITH SUBACROMIAL DECOMPRESSION, ROTATOR CUFF REPAIR AND BICEP TENDON REPAIR Right 12/30/2021   Procedure: SHOULDER ARTHROSCOPY WITH SUBACROMIAL DECOMPRESSION, ROTATOR CUFF REPAIR AND BICEP TENDON REPAIR;  Surgeon: Hiram Gash, MD;  Location: [place];  Service: Orthopedics;  Laterality: Right;   VASECTOMY  11/09   Patient Active Problem List   Diagnosis Date Noted   Mixed hyperlipidemia 12/21/2020   Intractable chronic migraine without aura and without status migrainosus 10/14/2018   Intractable headache 07/17/2018   Aseptic meningitis 04/23/2018   Left knee pain 12/08/2016   Plantar fasciitis of left foot 12/29/2015   Cavus deformity of foot 12/29/2015   Pure hypercholesterolemia 06/23/2011    REFERRING PROVIDER: Hiram Gash, MD   REFERRING DIAG: S/P right shoulder arthroscopy with subacromial decompression, distal clavicle excision, biceps  THERAPY DIAG:  Right shoulder pain, unspecified chronicity  Abnormal posture  Muscle weakness (generalized)  PERTINENT HISTORY: S/P right shoulder arthroscopy rotator cuff repiar with subacromial decompression, distal clavicle excision, biceps on 12/30/21  PRECAUTIONS: see protocol  SUBJECTIVE: Patient reports he saw the surgeon this morning and was told he was doing well and did not need to follow-up unless needed. He does report the  shoulder is a little more aggravated today.   PAIN:  Are you having pain? Yes:  NPRS scale: 2/10  Pain location: Right shoulder Pain description: Sharp, tight Relieving factors: Ice, medication Aggravating Factors: Shoulder movement  PATIENT GOALS "Pain free and full use of arm."   OBJECTIVE: (objective measures completed at initial evaluation unless otherwise dated) PATIENT SURVEYS:  FOTO 45, predicted 56     POSTURE: Rounded shoulders    UPPER EXTREMITY ROM:    Passive ROM Right 01/03/2022  Left 01/03/2022 Right  01/17/22 Right 01/26/22 Right 02/02/2022 Right 02/09/22 Right   02/16/22 Right  AROM 02/16/22 Right AROM 02/21/22 02/23/22 02/28/2022 03/23/2022  Shoulder flexion 90 PROM 170 AROM 95 PROM 110 PROM at table  95 PROM supine 100 deg PROM 110 AAROM supine 117 flexion AAROM/130 scaption AAROM 115 120 PROM 130 PROM 150 deg PROM 155 deg  Shoulder extension                Shoulder abduction 95 PROM 170 AROM 60 Per protocol   60 deg PROM 75 PROM 90 PROM Scaption 105 Scaption 105     Shoulder adduction                Shoulder internal rotation 67 PROM 70 AROM      Reaches right upper gluteal Reaches upper right gluteal     Shoulder external rotation 46 PROM 90 AROM 20 PROM 12 PROM at neutral 35-40 deg @ 45 deg scapular plane PROM 30 degrees @ 45 degrees PROM 35 @ 45 degrees AAROM Reaches occiput 45@ 60 abduction  PROM 60 @ 75 degrees abduction PROM 70 deg   Elbow flexion       WFL         Elbow extension       WFL         (Blank rows = not tested)   UPPER EXTREMITY MMT:   MMT Right 01/03/2022 Left 01/03/2022  Shoulder flexion      Shoulder extension      Shoulder abduction      Shoulder adduction      Shoulder internal rotation      Shoulder external rotation      Middle trapezius      Lower trapezius      Elbow flexion      Elbow extension      Wrist flexion      Wrist extension      Wrist ulnar deviation      Wrist radial deviation      Wrist pronation      Wrist supination      Grip strength (lbs)      (Blank rows = not tested)             **deterred due to protocol   PALPATION:  TTP over incision over anterior right shoulder and biceps              TODAY'S TREATMENT:  [facility] Adult PT Treatment:                                                DATE: 03/25/2022 Therapeutic Exercise: UBE L1 x 4 min (2 fwd/bwd) while taking subjective Sidelying shoulder flexion 2 x 10 Sidelying shoulder abduction 2 x 10 Supine shoulder flexion holding physioball x 10 Supine scapular  protraction/retraction holding physioball  x 10 Quadruped rock back for shoulder flexion stretch x 10 Quadruped scapular protraction/retraction x 10 Manual Therapy: Supine GHJ mobs primarily in posterior and inferior directions at various ranges of shoulder elevation PROM right shoulder all directions as tolerated Sidelying scapulothoracic mobs all directions Vasopneumatic (Game Ready): (not billed) Location: Right shoulder Time: 15 min Temperature: 34 deg Pressure: Medium   [facility] Adult PT Treatment:                                                DATE: 03/23/2022 Therapeutic Exercise: Supine shoulder flexion with 2# x 10, 3# x 10 Supine shoulder rhythmic stabilization at 90 deg 2 x 30 sec Standing wall slide flexion x 10 Shoulder isometric flexion, extension, abduction, ER, IR 5 x 5 sec each Row with red 2 x 15 Standing IR/ER with red x 15 each Standing shoulder flexion with back against wall x 10 Step back stretch at counter 5 x 5 sec Manual Therapy: Supine GHJ mobs primarily in posterior and inferior directions at various ranges of shoulder elevation PROM right shoulder all directions as tolerated Vasopneumatic (Game Ready): (not billed) Location: Right shoulder Time: 15 min Temperature: 34 deg Pressure: Medium  [facility] Adult PT Treatment:                                                DATE: 03/16/2022 Therapeutic Exercise: Supine shoulder flexion AROM x 10, with 1# x 10, with 2# x 10 Supine scapular protraction/retraction with 2# 2 x 10 Sidelying shoulder ER with 1# x 15 Sidelying shoulder abduction 2 x 10 Prone row with 2# 2 x 10 Standing ER with yellow x 15 Standing wall slide flexion x10 Ranger right shoulder flexion AAROM x 10 Standing shoulder IR across back with towel x 10 Manual Therapy: Supine GHJ mobs primarily in posterior and inferior directions at various ranges of shoulder elevation Sidelying scapular mobs all directions PROM right shoulder all directions as  tolerated Vasopneumatic (Game Ready): (not billed) Location: Right shoulder Time: 15 min Temperature: 34 deg Pressure: Medium    PATIENT EDUCATION: Education details: HEP Person educated: Patient Education method: Consulting civil engineer, Demonstration, Corporate treasurer cues, Verbal cues Education comprehension: verbalized understanding   HOME EXERCISE PROGRAM: Access Code: [email or web address]    ASSESSMENT: CLINICAL IMPRESSION: Patient tolerated therapy well with no adverse effects. He reported increased shoulder pain this visit so did not add any resistance with exercises but incorporated exercise in different positions/planes including quadruped to increase body weight placed through the right shoulder. He does continue to exhibit limitation in right shoulder mobility but is gradually improving, so will continue to focus primarily on shoulder mobility to achieve full range of motion, and progress strength. Patient would benefit from continued skilled PT to reduce pain and progress his shoulder mobility and active motion as protocol allows in order to maximize functional ability.     OBJECTIVE IMPAIRMENTS decreased ROM, decreased strength, impaired UE functional use, and pain.    ACTIVITY LIMITATIONS cleaning, meal prep, occupation, and yard work.    PERSONAL FACTORS Time since onset of injury/illness/exacerbation and 1 comorbidity: hyperlipidemia  are also affecting patient's functional outcome.      GOALS: SHORT TERM GOALS: Target date: 01/17/2022   Patient will  be independent with HEP for PT progression. Baseline: initial HEP addressed Status 01/17/22: independent with initial HEP Goal status: MET     LONG TERM GOALS: Target date: 04/25/2022   Patient will score 56 on FOTO. Baseline: 45 02/28/2022: not assessed Goal status: DEFERRED TO NEXT VISIT   2.  Patient will demonstrate >/= 170 degrees right shoulder flexion and abduction AROM to improve ability to reach for items overhead in kitchen cabinet.   Baseline: unable to perform 02/28/2022: approx 150 deg PROM Goal status: ONGOING   3.  Patient will demonstrate 90 degrees right shoulder ER AROM to improve ability to reach [facility] without limitations. Baseline: unable to perform 02/28/2022: approx 70 deg PROM Goal status: ONGOING   4.  Patient will demonstrate improved erect posture to assist with reaching [place] without limitations.  Baseline: rounded shoulders 02/28/2022: rounded shoulders  Goal status: ONGOING   5.  Patient will demonstrate >/= 4/5 right shoulder MMT to perform ADLs at home.  Baseline: unable to assess due to surgical protocol 02/28/2022: not assessed per protocol Goal status: DEFERRED    PLAN: PT FREQUENCY: 1-2x/week   PT DURATION: 8 weeks   PLANNED INTERVENTIONS: Therapeutic exercises, Therapeutic activity, Neuromuscular re-education, Patient/Family education, Joint mobilization, Dry Needling, Electrical stimulation, Cryotherapy, Moist heat, scar mobilization, Taping, and Manual therapy   PLAN FOR NEXT SESSION: Review HEP and progress PRN, continue manual and PROM to improve shoulder mobility, begin isometrics and progress AROM   Hilda Blades, PT, DPT, LAT, ATC 03/25/22  11:01 AM Phone: [phone] Fax: [phone]

## 2022-03-29 ENCOUNTER — Encounter: Payer: Self-pay | Admitting: Physical Therapy

## 2022-03-29 ENCOUNTER — Ambulatory Visit: Payer: 59 | Admitting: Physical Therapy

## 2022-03-29 ENCOUNTER — Other Ambulatory Visit: Payer: Self-pay

## 2022-03-29 DIAGNOSIS — R293 Abnormal posture: Secondary | ICD-10-CM

## 2022-03-29 DIAGNOSIS — M6281 Muscle weakness (generalized): Secondary | ICD-10-CM

## 2022-03-29 DIAGNOSIS — M25511 Pain in right shoulder: Secondary | ICD-10-CM | POA: Diagnosis not present

## 2022-03-29 NOTE — Therapy (Signed)
OUTPATIENT PHYSICAL THERAPY TREATMENT NOTE    Patient Name: Larry Cruz MRN: [id number] DOB:1971-08-25, 51 y.o., male Today's Date: 03/30/2022  PCP: Rita Ohara, MD  REFERRING PROVIDER: Hiram Gash, MD     PT End of Session - 03/29/22 1455     Visit Number 16    Number of Visits 26    Date for PT Re-Evaluation 04/25/22    Authorization Type [employer]    Progress Note Due on Visit 20    PT Start Time 1448    PT Stop Time 1530    PT Time Calculation (min) 42 min    Activity Tolerance Patient tolerated treatment well    Behavior During Therapy [facility] for tasks assessed/performed                      Past Medical History:  Diagnosis Date   Aseptic meningitis    Colorblind    Dyslipidemia    Headache    Herpes genitalis in men    Hx of elbow surgery 09/02/2019   right elbow percutaneous ECRB release/lateral elbow percutaneous release-Dr.Gramig   Hyperlipidemia    Lateral epicondylitis of right elbow 03/2018   s/p injection by Dr. Micheline Chapman; MRI 10/2018, f/b eval by Dr. Amedeo Plenty   Low back pain    Meningitis 2019   negative cultures   Torn tendon    R elbow   Past Surgical History:  Procedure Laterality Date   CERVICAL DISC SURGERY  2009   JONES, MD; fusion   ELBOW SURGERY  09/02/2019   right elbow percutaneous ECRB release/lateral elbow percutaneous release-Dr. Mason Right    percutaneous--done by Dr. Amedeo Plenty in either 08/2019 or 09/2019   LUMBAR DISC SURGERY  2000   CARTER, MD; L4-L5 (right)   LUMBAR DISC SURGERY  11/2013   L4-5, Dr. Ronnald Ramp (left)   SHOULDER ARTHROSCOPY Right 12/30/2021   Procedure: ARTHROSCOPY SHOULDER/DEBRIDEMENT;  Surgeon: Hiram Gash, MD;  Location: [place];  Service: Orthopedics;  Laterality: Right;   SHOULDER ARTHROSCOPY WITH DISTAL CLAVICLE RESECTION Right 12/30/2021   Procedure: SHOULDER ARTHROSCOPY WITH DISTAL CLAVICLE EXCISION;  Surgeon: Hiram Gash, MD;  Location: [place];  Service: Orthopedics;  Laterality: Right;   SHOULDER ARTHROSCOPY WITH SUBACROMIAL DECOMPRESSION, ROTATOR CUFF REPAIR AND BICEP TENDON REPAIR Right 12/30/2021   Procedure: SHOULDER ARTHROSCOPY WITH SUBACROMIAL DECOMPRESSION, ROTATOR CUFF REPAIR AND BICEP TENDON REPAIR;  Surgeon: Hiram Gash, MD;  Location: [place];  Service: Orthopedics;  Laterality: Right;   VASECTOMY  11/09   Patient Active Problem List   Diagnosis Date Noted   Mixed hyperlipidemia 12/21/2020   Intractable chronic migraine without aura and without status migrainosus 10/14/2018   Intractable headache 07/17/2018   Aseptic meningitis 04/23/2018   Left knee pain 12/08/2016   Plantar fasciitis of left foot 12/29/2015   Cavus deformity of foot 12/29/2015   Pure hypercholesterolemia 06/23/2011    REFERRING PROVIDER: Hiram Gash, MD   REFERRING DIAG: S/P right shoulder arthroscopy with subacromial decompression, distal clavicle excision, biceps  THERAPY DIAG:  Right shoulder pain, unspecified chronicity  Abnormal posture  Muscle weakness (generalized)  PERTINENT HISTORY: S/P right shoulder arthroscopy rotator cuff repiar with subacromial decompression, distal clavicle excision, biceps on 12/30/21  PRECAUTIONS: see protocol   SUBJECTIVE: Patient reports he is doing well. Shoulder pain remains low overall but does continue to have pain with certain movements.  PAIN:  Are  you having pain? Yes:  NPRS scale: 1/10  Pain location: Right shoulder Pain description: Sharp, tight Relieving factors: Ice, medication Aggravating Factors: Shoulder movement  PATIENT GOALS "Pain free and full use of arm."   OBJECTIVE: (objective measures completed at initial evaluation unless otherwise dated) PATIENT SURVEYS:  FOTO 45, predicted 56   POSTURE: Rounded shoulders    UPPER EXTREMITY ROM:    Passive ROM Right 01/03/2022 Left 01/03/2022 Right  01/17/22 Right 01/26/22 Right 02/02/2022  Right 02/09/22 Right   02/16/22 Right  AROM 02/16/22 03/23/2022 03/29/2022  Shoulder flexion 90 PROM 170 AROM 95 PROM 110 PROM at table  95 PROM supine 100 deg PROM 110 AAROM supine 117 flexion AAROM/130 scaption AAROM 115 PROM 155 deg PROM 155 deg AROM 130 deg  Shoulder extension              Shoulder abduction 95 PROM 170 AROM 60 Per protocol   60 deg PROM 75 PROM 90 PROM Scaption 105    Shoulder adduction              Shoulder internal rotation 67 PROM 70 AROM      Reaches right upper gluteal    Shoulder external rotation 46 PROM 90 AROM 20 PROM 12 PROM at neutral 35-40 deg @ 45 deg scapular plane PROM 30 degrees @ 45 degrees PROM 35 @ 45 degrees AAROM Reaches occiput    Elbow flexion       WFL       Elbow extension       WFL       (Blank rows = not tested)   UPPER EXTREMITY MMT:   MMT Right 01/03/2022 Left 01/03/2022  Shoulder flexion      Shoulder extension      Shoulder abduction      Shoulder adduction      Shoulder internal rotation      Shoulder external rotation      Middle trapezius      Lower trapezius      Elbow flexion      Elbow extension      Wrist flexion      Wrist extension      Wrist ulnar deviation      Wrist radial deviation      Wrist pronation      Wrist supination      Grip strength (lbs)      (Blank rows = not tested)             **deterred due to protocol   PALPATION:  TTP over incision over anterior right shoulder and biceps              TODAY'S TREATMENT:  [facility] Adult PT Treatment:                                                DATE: 03/29/2022 Therapeutic Exercise: UBE L1 x 4 min (2 fwd/bwd) while taking subjective Supine flexion with physioball x 10, with 2# x 10, 3# x 10 Reclined (45 deg) shoulder flexion with 1# x 10, 2# x 10, 3# x 10 Sidelying shoulder flexion 2 x 10 Standing shoulder flexion with back at wall x 10, with opposite arm overpressure x 10 Manual Therapy: Supine GHJ mobs primarily in posterior and inferior directions at  various ranges of shoulder elevation PROM right shoulder all  directions as tolerated Scap pinned shoulder flexion and cross body stretch   [facility] Adult PT Treatment:                                                DATE: 03/25/2022 Therapeutic Exercise: UBE L1 x 4 min (2 fwd/bwd) while taking subjective Sidelying shoulder flexion 2 x 10 Sidelying shoulder abduction 2 x 10 Supine shoulder flexion holding physioball x 10 Supine scapular protraction/retraction holding physioball x 10 Quadruped rock back for shoulder flexion stretch x 10 Quadruped scapular protraction/retraction x 10 Manual Therapy: Supine GHJ mobs primarily in posterior and inferior directions at various ranges of shoulder elevation PROM right shoulder all directions as tolerated Sidelying scapulothoracic mobs all directions Vasopneumatic (Game Ready): (not billed) Location: Right shoulder Time: 15 min Temperature: 34 deg Pressure: Medium  [facility] Adult PT Treatment:                                                DATE: 03/23/2022 Therapeutic Exercise: Supine shoulder flexion with 2# x 10, 3# x 10 Supine shoulder rhythmic stabilization at 90 deg 2 x 30 sec Standing wall slide flexion x 10 Shoulder isometric flexion, extension, abduction, ER, IR 5 x 5 sec each Row with red 2 x 15 Standing IR/ER with red x 15 each Standing shoulder flexion with back against wall x 10 Step back stretch at counter 5 x 5 sec Manual Therapy: Supine GHJ mobs primarily in posterior and inferior directions at various ranges of shoulder elevation PROM right shoulder all directions as tolerated Vasopneumatic (Game Ready): (not billed) Location: Right shoulder Time: 15 min Temperature: 34 deg Pressure: Medium    PATIENT EDUCATION: Education details: HEP Person educated: Patient Education method: Consulting civil engineer, Demonstration, Corporate treasurer cues, Verbal cues Education comprehension: verbalized understanding   HOME EXERCISE PROGRAM: Access Code:  [email or web address]    ASSESSMENT: CLINICAL IMPRESSION: Patient tolerated therapy well with no adverse effects. Therapy continues to focus on improving his shoulder mobility and progressed shoulder elevation AROM and strengthening. He seems to be continuing to improve but overall remains limited with his shoulder mobility with pain at end ranges. He does require occasional cueing to avoid shoulder shrug with elevation. He was instructed on posterior cuff stretching this visit to improve mobility with good tolerance. No changes to HEP. Patient would benefit from continued skilled PT to reduce pain and progress his shoulder mobility and active motion as protocol allows in order to maximize functional ability.     OBJECTIVE IMPAIRMENTS decreased ROM, decreased strength, impaired UE functional use, and pain.    ACTIVITY LIMITATIONS cleaning, meal prep, occupation, and yard work.    PERSONAL FACTORS Time since onset of injury/illness/exacerbation and 1 comorbidity: hyperlipidemia  are also affecting patient's functional outcome.      GOALS: SHORT TERM GOALS: Target date: 01/17/2022   Patient will be independent with HEP for PT progression. Baseline: initial HEP addressed Status 01/17/22: independent with initial HEP Goal status: MET    LONG TERM GOALS: Target date: 04/25/2022   Patient will score 56 on FOTO. Baseline: 45 02/28/2022: not assessed Goal status: DEFERRED TO NEXT VISIT   2.  Patient will demonstrate >/= 170 degrees right shoulder flexion and abduction AROM to improve ability  to reach for items overhead in kitchen cabinet.  Baseline: unable to perform 02/28/2022: approx 150 deg PROM Goal status: ONGOING   3.  Patient will demonstrate 90 degrees right shoulder ER AROM to improve ability to reach [facility] without limitations. Baseline: unable to perform 02/28/2022: approx 70 deg PROM Goal status: ONGOING   4.  Patient will demonstrate improved erect posture to assist with reaching [place] without  limitations.  Baseline: rounded shoulders 02/28/2022: rounded shoulders  Goal status: ONGOING   5.  Patient will demonstrate >/= 4/5 right shoulder MMT to perform ADLs at home.  Baseline: unable to assess due to surgical protocol 02/28/2022: not assessed per protocol Goal status: DEFERRED    PLAN: PT FREQUENCY: 1-2x/week   PT DURATION: 8 weeks   PLANNED INTERVENTIONS: Therapeutic exercises, Therapeutic activity, Neuromuscular re-education, Patient/Family education, Joint mobilization, Dry Needling, Electrical stimulation, Cryotherapy, Moist heat, scar mobilization, Taping, and Manual therapy   PLAN FOR NEXT SESSION: Review HEP and progress PRN, continue manual and PROM to improve shoulder mobility, begin isometrics and progress AROM   Hilda Blades, PT, DPT, LAT, ATC 03/30/22  8:25 AM Phone: [phone] Fax: [phone]

## 2022-03-31 ENCOUNTER — Other Ambulatory Visit (HOSPITAL_COMMUNITY): Payer: Self-pay

## 2022-04-01 ENCOUNTER — Encounter: Payer: Self-pay | Admitting: Physical Therapy

## 2022-04-01 ENCOUNTER — Encounter: Payer: Self-pay | Admitting: Family Medicine

## 2022-04-01 ENCOUNTER — Other Ambulatory Visit: Payer: Self-pay

## 2022-04-01 ENCOUNTER — Telehealth: Payer: 59 | Admitting: Physician Assistant

## 2022-04-01 ENCOUNTER — Ambulatory Visit: Payer: 59 | Admitting: Physical Therapy

## 2022-04-01 ENCOUNTER — Other Ambulatory Visit (HOSPITAL_COMMUNITY): Payer: Self-pay

## 2022-04-01 DIAGNOSIS — M25511 Pain in right shoulder: Secondary | ICD-10-CM

## 2022-04-01 DIAGNOSIS — M6281 Muscle weakness (generalized): Secondary | ICD-10-CM | POA: Diagnosis not present

## 2022-04-01 DIAGNOSIS — R293 Abnormal posture: Secondary | ICD-10-CM

## 2022-04-01 DIAGNOSIS — L237 Allergic contact dermatitis due to plants, except food: Secondary | ICD-10-CM

## 2022-04-01 MED ORDER — PREDNISONE 10 MG PO TABS
ORAL_TABLET | ORAL | 0 refills | Status: AC
  Filled 2022-04-01: qty 37, 14d supply, fill #0

## 2022-04-01 MED ORDER — TRIAMCINOLONE ACETONIDE 0.1 % EX CREA
1.0000 | TOPICAL_CREAM | Freq: Two times a day (BID) | CUTANEOUS | 0 refills | Status: DC
  Filled 2022-04-01: qty 30, 15d supply, fill #0

## 2022-04-01 NOTE — Patient Instructions (Signed)
Larry Cruz, thank you for joining Leeanne Rio, PA-C for today's virtual visit.  While this provider is not your primary care provider (PCP), if your PCP is located in our provider database this encounter information will be shared with them immediately following your visit.  Consent: (Patient) Larry Cruz provided verbal consent for this virtual visit at the beginning of the encounter.  Current Medications:  Current Outpatient Medications:    atorvastatin (LIPITOR) 80 MG tablet, Take 1 tablet (80 mg total) by mouth daily., Disp: 90 tablet, Rfl: 1   diclofenac (VOLTAREN) 75 MG EC tablet, Take 1 tablet (75 mg total) by mouth 2 (two) times daily., Disp: 60 tablet, Rfl: 0   Esomeprazole Magnesium (NEXIUM 24HR PO), Take 1 capsule by mouth daily as needed., Disp: , Rfl:    gabapentin (NEURONTIN) 100 MG capsule, Take 1 capsule (100 mg total) by mouth 3 (three) times daily., Disp: 90 capsule, Rfl: 1   Multiple Vitamin (MULTIVITAMIN) tablet, Take 1 tablet by mouth daily., Disp: , Rfl:    valACYclovir (VALTREX) 500 MG tablet, Take 1 tablet (500 mg total) by mouth 2 (two) times daily for 3 days as needed for outbreaks., Disp: 30 tablet, Rfl: 0   zolpidem (AMBIEN) 10 MG tablet, Take 1 tablet (10 mg total) by mouth at bedtime as needed for sleep., Disp: 30 tablet, Rfl: 0   Medications ordered in this encounter:  No orders of the defined types were placed in this encounter.    *If you need refills on other medications prior to your next appointment, please contact your pharmacy*  Follow-Up: Call back or seek an in-person evaluation if the symptoms worsen or if the condition fails to improve as anticipated.  Other Instructions Poison Ivy Dermatitis Poison ivy dermatitis is redness and soreness of the skin caused by chemicals in the leaves of the poison ivy plant. You may have very bad itching, swelling, a rash, and blisters. What are the causes? Touching a poison ivy plant. Touching  something that has the chemical on it. This may include animals or objects that have come in contact with the plant. What increases the risk? Going outdoors often in wooded or [place] areas. Going outdoors without wearing protective clothing, such as closed shoes, long pants, and a long-sleeved shirt. What are the signs or symptoms?  Skin redness. Very bad itching. A rash that often includes bumps and blisters. The rash usually appears 48 hours after exposure, if you have been exposed before. If this is the first time you have been exposed, the rash may not appear until a week after exposure. Swelling. This may occur if the reaction is very bad. Symptoms usually last for 1-2 weeks. The first time you develop this condition, symptoms may last 3-4 weeks. How is this treated? This condition may be treated with: Hydrocortisone cream or calamine lotion to relieve itching. Oatmeal baths to soothe the skin. Medicines, such as over-the-counter antihistamine tablets. Oral steroid medicine for more severe reactions. Follow these instructions at home: Medicines Take or apply over-the-counter and prescription medicines only as told by your doctor. Use hydrocortisone cream or calamine lotion as needed to help with itching. General instructions Do not scratch or rub your skin. Put a cold, wet cloth (cold compress) on the affected areas or take baths in cool water. This will help with itching. Avoid hot baths and showers. Take oatmeal baths as needed. Use colloidal oatmeal. You can get this at a pharmacy or grocery store. Follow the  instructions on the package. While you have the rash, wash your clothes right after you wear them. Keep all follow-up visits as told by your health care provider. This is important. How is this prevented?  Know what poison ivy looks like, so you can avoid it. This plant has three leaves with flowering branches on a single stem. The leaves are glossy. The leaves have  uneven edges that come to a point at the front. If you touch poison ivy, wash your skin with soap and water right away. Be sure to wash under your fingernails. When hiking or camping, wear long pants, a long-sleeved shirt, tall socks, and hiking boots. You can also use a lotion on your skin that helps to prevent contact with poison ivy. If you think that your clothes or outdoor gear came in contact with poison ivy, rinse them off with a garden hose before you bring them inside your house. When doing yard work or gardening, wear gloves, long sleeves, long pants, and boots. Wash your garden tools and gloves if they come in contact with poison ivy. If you think that your pet has come into contact with poison ivy, wash him or her with pet shampoo and water. Make sure to wear gloves while washing your pet. Contact a doctor if: You have open sores in the rash area. You have more redness, swelling, or pain in the rash area. You have redness that spreads beyond the rash area. You have fluid, blood, or pus coming from the rash area. You have a fever. You have a rash over a large area of your body. You have a rash on your eyes, mouth, or genitals. Your rash does not get better after a few weeks. Get help right away if: Your face swells or your eyes swell shut. You have trouble breathing. You have trouble swallowing. These symptoms may be an emergency. Do not wait to see if the symptoms will go away. Get medical help right away. Call your local emergency services (911 in the [place] Do not drive yourself to the hospital. Summary Poison ivy dermatitis is redness and soreness of the skin caused by chemicals in the leaves of the poison ivy plant. You may have skin redness, very bad itching, swelling, and a rash. Do not scratch or rub your skin. Take or apply over-the-counter and prescription medicines only as told by your doctor. This information is not intended to replace advice given to you by your  health care provider. Make sure you discuss any questions you have with your health care provider. Document Revised: 06/21/2021 Document Reviewed: 06/21/2021 Elsevier Patient Education  [place].    If you have been instructed to have an in-person evaluation today at a local Urgent Care facility, please use the link below. It will take you to a list of all of our available [facility], including address, phone number and hours of operation. Please do not delay care.  [facility]  If you or a family member do not have a primary care provider, use the link below to schedule a visit and establish care. When you choose a [facility] primary care physician or advanced practice provider, you gain a long-term partner in health. Find a Primary Care Provider  Learn more about [facility] in-office and virtual care options: [facility]

## 2022-04-01 NOTE — Progress Notes (Signed)
Virtual Visit Consent   [name], you are scheduled for a virtual visit with a [facility] provider today. Just as with appointments in the office, your consent must be obtained to participate. Your consent will be active for this visit and any virtual visit you may have with one of our providers in the next 365 days. If you have a MyChart account, a copy of this consent can be sent to you electronically.  As this is a virtual visit, video technology does not allow for your provider to perform a traditional examination. This may limit your provider's ability to fully assess your condition. If your provider identifies any concerns that need to be evaluated in person or the need to arrange testing (such as labs, EKG, etc.), we will make arrangements to do so. Although advances in technology are sophisticated, we cannot ensure that it will always work on either your end or our end. If the connection with a video visit is poor, the visit may have to be switched to a telephone visit. With either a video or telephone visit, we are not always able to ensure that we have a secure connection.  By engaging in this virtual visit, you consent to the provision of healthcare and authorize for your insurance to be billed (if applicable) for the services provided during this visit. Depending on your insurance coverage, you may receive a charge related to this service.  I need to obtain your verbal consent now. Are you willing to proceed with your visit today? Larry Cruz has provided verbal consent on 04/01/2022 for a virtual visit (video or telephone). Leeanne Rio, [place]  Date: 04/01/2022 10:57 AM  Virtual Visit via Video Note   I, Leeanne Rio, connected with  Larry Cruz  ([id number], 1971-06-06) on 04/01/22 at 10:45 AM EDT by a video-enabled telemedicine application and verified that I am speaking with the correct person using two identifiers.  Location: Patient: Virtual Visit  Location Patient: Other: Outside Provider: Virtual Visit Location Provider: Home Office   I discussed the limitations of evaluation and management by telemedicine and the availability of in person appointments. The patient expressed understanding and agreed to proceed.    History of Present Illness: Larry Cruz is a 51 y.o. who identifies as a male who was assigned male at birth, and is being seen today for persistent poison ivy rash. Notes being exposed a couple of weeks ago initially with few areas on arms. Has continue to spread. Now with rash of chest, back, neck, Legs bilaterally. Denies fevers, chills. Weeping of the lesion on arms. Denies change to soaps, lotions, detergents. Notes he has history of similar issues when exposed to poison ivy/oak. Has been using OTC cortisone cream with little relief.   HPI: HPI  Problems:  Patient Active Problem List   Diagnosis Date Noted   Mixed hyperlipidemia 12/21/2020   Intractable chronic migraine without aura and without status migrainosus 10/14/2018   Intractable headache 07/17/2018   Aseptic meningitis 04/23/2018   Left knee pain 12/08/2016   Plantar fasciitis of left foot 12/29/2015   Cavus deformity of foot 12/29/2015   Pure hypercholesterolemia 06/23/2011    Allergies: No Known Allergies Medications:  Current Outpatient Medications:    predniSONE (DELTASONE) 10 MG tablet, Take 4 tablets (40 mg total) by mouth daily with breakfast for 4 days, THEN 3 tablets (30 mg total) daily with breakfast for 4 days, THEN 2 tablets (20 mg total) daily with breakfast for  3 days, THEN 1 tablet (10 mg total) daily with breakfast for 3 days., Disp: 37 tablet, Rfl: 0   triamcinolone cream (KENALOG) 0.1 %, Apply 1 Application topically 2 (two) times daily., Disp: 30 g, Rfl: 0   atorvastatin (LIPITOR) 80 MG tablet, Take 1 tablet (80 mg total) by mouth daily., Disp: 90 tablet, Rfl: 1   Esomeprazole Magnesium (NEXIUM 24HR PO), Take 1 capsule by mouth daily  as needed., Disp: , Rfl:    Multiple Vitamin (MULTIVITAMIN) tablet, Take 1 tablet by mouth daily., Disp: , Rfl:    valACYclovir (VALTREX) 500 MG tablet, Take 1 tablet (500 mg total) by mouth 2 (two) times daily for 3 days as needed for outbreaks., Disp: 30 tablet, Rfl: 0   zolpidem (AMBIEN) 10 MG tablet, Take 1 tablet (10 mg total) by mouth at bedtime as needed for sleep., Disp: 30 tablet, Rfl: 0  Observations/Objective: Patient is well-developed, well-nourished in no acute distress.  Resting comfortably at home.  Head is normocephalic, atraumatic.  No labored breathing. Speech is clear and coherent with logical content.  Patient is alert and oriented at baseline.  Papulovesicular lesions noted of arms bilaterally. R arms with linear presentation with some weeping noted. L arm with more scattered lesions. Unable to examine other areas of skin as he is out in public at present.   Assessment and Plan: 1. Allergic contact dermatitis due to plants, except food - predniSONE (DELTASONE) 10 MG tablet; Take 4 tablets (40 mg total) by mouth daily with breakfast for 4 days, THEN 3 tablets (30 mg total) daily with breakfast for 4 days, THEN 2 tablets (20 mg total) daily with breakfast for 3 days, THEN 1 tablet (10 mg total) daily with breakfast for 3 days.  Dispense: 37 tablet; Refill: 0 - triamcinolone cream (KENALOG) 0.1 %; Apply 1 Application topically 2 (two) times daily.  Dispense: 30 g; Refill: 0  Supportive measures and OTC medications reviewed. Start Triamcinolone to most troublesome areas. Rx prednisone taper as directed.   Follow Up Instructions: I discussed the assessment and treatment plan with the patient. The patient was provided an opportunity to ask questions and all were answered. The patient agreed with the plan and demonstrated an understanding of the instructions.  A copy of instructions were sent to the patient via MyChart unless otherwise noted below.   The patient was advised to  call back or seek an in-person evaluation if the symptoms worsen or if the condition fails to improve as anticipated.  Time:  I spent 10 minutes with the patient via telehealth technology discussing the above problems/concerns.    Leeanne Rio, PA-C

## 2022-04-01 NOTE — Therapy (Signed)
OUTPATIENT PHYSICAL THERAPY TREATMENT NOTE    Patient Name: Larry Cruz MRN: [id number] DOB:01-03-1971, 51 y.o., male Today's Date: 04/01/2022  PCP: Rita Ohara, MD  REFERRING PROVIDER: Hiram Gash, MD     PT End of Session - 04/01/22 1128     Visit Number 17    Number of Visits 26    Date for PT Re-Evaluation 04/25/22    Authorization Type [employer]    Progress Note Due on Visit 20    PT Start Time 1130    PT Stop Time 1215    PT Time Calculation (min) 45 min    Activity Tolerance Patient tolerated treatment well    Behavior During Therapy [facility] for tasks assessed/performed                       Past Medical History:  Diagnosis Date   Aseptic meningitis    Colorblind    Dyslipidemia    Headache    Herpes genitalis in men    Hx of elbow surgery 09/02/2019   right elbow percutaneous ECRB release/lateral elbow percutaneous release-Dr.Gramig   Hyperlipidemia    Lateral epicondylitis of right elbow 03/2018   s/p injection by Dr. Micheline Chapman; MRI 10/2018, f/b eval by Dr. Amedeo Plenty   Low back pain    Meningitis 2019   negative cultures   Torn tendon    R elbow   Past Surgical History:  Procedure Laterality Date   CERVICAL DISC SURGERY  2009   JONES, MD; fusion   ELBOW SURGERY  09/02/2019   right elbow percutaneous ECRB release/lateral elbow percutaneous release-Dr. Sells Right    percutaneous--done by Dr. Amedeo Plenty in either 08/2019 or 09/2019   LUMBAR DISC SURGERY  2000   CARTER, MD; L4-L5 (right)   LUMBAR DISC SURGERY  11/2013   L4-5, Dr. Ronnald Ramp (left)   SHOULDER ARTHROSCOPY Right 12/30/2021   Procedure: ARTHROSCOPY SHOULDER/DEBRIDEMENT;  Surgeon: Hiram Gash, MD;  Location: [place];  Service: Orthopedics;  Laterality: Right;   SHOULDER ARTHROSCOPY WITH DISTAL CLAVICLE RESECTION Right 12/30/2021   Procedure: SHOULDER ARTHROSCOPY WITH DISTAL CLAVICLE EXCISION;  Surgeon: Hiram Gash, MD;  Location: [place];  Service: Orthopedics;  Laterality: Right;   SHOULDER ARTHROSCOPY WITH SUBACROMIAL DECOMPRESSION, ROTATOR CUFF REPAIR AND BICEP TENDON REPAIR Right 12/30/2021   Procedure: SHOULDER ARTHROSCOPY WITH SUBACROMIAL DECOMPRESSION, ROTATOR CUFF REPAIR AND BICEP TENDON REPAIR;  Surgeon: Hiram Gash, MD;  Location: [place];  Service: Orthopedics;  Laterality: Right;   VASECTOMY  11/09   Patient Active Problem List   Diagnosis Date Noted   Mixed hyperlipidemia 12/21/2020   Intractable chronic migraine without aura and without status migrainosus 10/14/2018   Intractable headache 07/17/2018   Aseptic meningitis 04/23/2018   Left knee pain 12/08/2016   Plantar fasciitis of left foot 12/29/2015   Cavus deformity of foot 12/29/2015   Pure hypercholesterolemia 06/23/2011    REFERRING PROVIDER: Hiram Gash, MD   REFERRING DIAG: S/P right shoulder arthroscopy with subacromial decompression, distal clavicle excision, biceps  THERAPY DIAG:  Right shoulder pain, unspecified chronicity  Abnormal posture  Muscle weakness (generalized)  PERTINENT HISTORY: S/P right shoulder arthroscopy rotator cuff repiar with subacromial decompression, distal clavicle excision, biceps on 12/30/21  PRECAUTIONS: see protocol   SUBJECTIVE: Patient reports he is doing well. Shoulder pain remains low overall but does continue to have pain with certain movements.  PAIN:  Are you having pain? Yes:  NPRS scale: 1/10  Pain location: Right shoulder Pain description: Sharp, tight Relieving factors: Ice, medication Aggravating Factors: Shoulder movement  PATIENT GOALS "Pain free and full use of arm."   OBJECTIVE: (objective measures completed at initial evaluation unless otherwise dated) PATIENT SURVEYS:  FOTO 45, predicted 56   POSTURE: Rounded shoulders    UPPER EXTREMITY ROM:    Passive ROM Right 01/03/2022 Left 01/03/2022 Right  01/17/22 Right 01/26/22 Right 02/02/2022  Right 02/09/22 Right   02/16/22 Right  AROM 02/16/22 03/23/2022 03/29/2022  Shoulder flexion 90 PROM 170 AROM 95 PROM 110 PROM at table  95 PROM supine 100 deg PROM 110 AAROM supine 117 flexion AAROM/130 scaption AAROM 115 PROM 155 deg PROM 155 deg AROM 130 deg  Shoulder extension              Shoulder abduction 95 PROM 170 AROM 60 Per protocol   60 deg PROM 75 PROM 90 PROM Scaption 105    Shoulder adduction              Shoulder internal rotation 67 PROM 70 AROM      Reaches right upper gluteal    Shoulder external rotation 46 PROM 90 AROM 20 PROM 12 PROM at neutral 35-40 deg @ 45 deg scapular plane PROM 30 degrees @ 45 degrees PROM 35 @ 45 degrees AAROM Reaches occiput    Elbow flexion       WFL       Elbow extension       WFL       (Blank rows = not tested)   UPPER EXTREMITY MMT:   MMT Right 01/03/2022 Left 01/03/2022  Shoulder flexion      Shoulder extension      Shoulder abduction      Shoulder adduction      Shoulder internal rotation      Shoulder external rotation      Middle trapezius      Lower trapezius      Elbow flexion      Elbow extension      Wrist flexion      Wrist extension      Wrist ulnar deviation      Wrist radial deviation      Wrist pronation      Wrist supination      Grip strength (lbs)      (Blank rows = not tested)             **deterred due to protocol   PALPATION:  TTP over incision over anterior right shoulder and biceps              TODAY'S TREATMENT:  [facility] Adult PT Treatment:                                                DATE: 04/01/2022 Therapeutic Exercise: UBE L1 x 4 min (2 fwd/bwd) while taking subjective Wall slide flexion 10 x 5 sec Row with green 2 x 20 ER/IR with red 2 x 15 each Extension with green 2 x 15 Scaption to 90 with 2# back at wall 2 x 15 Scapular protraction/retraction at wall 2 x 15 Sidelying flexion 2 x 10 Sidelying abduction 2 x 10 Sidelying horizontal abduction 2 x 10 Manual Therapy: Supine GHJ mobs  primarily in posterior and  inferior directions at various ranges of shoulder elevation PROM right shoulder all directions as tolerated Scap pinned shoulder flexion and cross body stretch   [facility] Adult PT Treatment:                                                DATE: 03/29/2022 Therapeutic Exercise: UBE L1 x 4 min (2 fwd/bwd) while taking subjective Supine flexion with physioball x 10, with 2# x 10, 3# x 10 Reclined (45 deg) shoulder flexion with 1# x 10, 2# x 10, 3# x 10 Sidelying shoulder flexion 2 x 10 Standing shoulder flexion with back at wall x 10, with opposite arm overpressure x 10 Manual Therapy: Supine GHJ mobs primarily in posterior and inferior directions at various ranges of shoulder elevation PROM right shoulder all directions as tolerated Scap pinned shoulder flexion and cross body stretch  [facility] Adult PT Treatment:                                                DATE: 03/25/2022 Therapeutic Exercise: UBE L1 x 4 min (2 fwd/bwd) while taking subjective Sidelying shoulder flexion 2 x 10 Sidelying shoulder abduction 2 x 10 Supine shoulder flexion holding physioball x 10 Supine scapular protraction/retraction holding physioball x 10 Quadruped rock back for shoulder flexion stretch x 10 Quadruped scapular protraction/retraction x 10 Manual Therapy: Supine GHJ mobs primarily in posterior and inferior directions at various ranges of shoulder elevation PROM right shoulder all directions as tolerated Sidelying scapulothoracic mobs all directions Vasopneumatic ([employer]): (not billed) Location: Right shoulder Time: 15 min Temperature: 34 deg Pressure: Medium  [facility] Adult PT Treatment:                                                DATE: 03/23/2022 Therapeutic Exercise: Supine shoulder flexion with 2# x 10, 3# x 10 Supine shoulder rhythmic stabilization at 90 deg 2 x 30 sec Standing wall slide flexion x 10 Shoulder isometric flexion, extension, abduction, ER, IR 5 x 5 sec  each Row with red 2 x 15 Standing IR/ER with red x 15 each Standing shoulder flexion with back against wall x 10 Step back stretch at counter 5 x 5 sec Manual Therapy: Supine GHJ mobs primarily in posterior and inferior directions at various ranges of shoulder elevation PROM right shoulder all directions as tolerated Vasopneumatic ([employer]): (not billed) Location: Right shoulder Time: 15 min Temperature: 34 deg Pressure: Medium    PATIENT EDUCATION: Education details: HEP Person educated: Patient Education method: Consulting civil engineer, Demonstration, Corporate treasurer cues, Verbal cues Education comprehension: verbalized understanding   HOME EXERCISE PROGRAM: Access Code: [email or web address]    ASSESSMENT: CLINICAL IMPRESSION: Patient tolerated therapy well with no adverse effects. Therapy continues to focus on progression of shoulder mobility and strength. He was able to tolerate increased banded resistance this visit and he does continue to report fatigue with exercises. No changes to HEP this visit. Patient would benefit from continued skilled PT to reduce pain and progress his shoulder mobility and active motion as protocol allows in order to maximize functional ability.  OBJECTIVE IMPAIRMENTS decreased ROM, decreased strength, impaired UE functional use, and pain.    ACTIVITY LIMITATIONS cleaning, meal prep, occupation, and yard work.    PERSONAL FACTORS Time since onset of injury/illness/exacerbation and 1 comorbidity: hyperlipidemia  are also affecting patient's functional outcome.      GOALS: SHORT TERM GOALS: Target date: 01/17/2022   Patient will be independent with HEP for PT progression. Baseline: initial HEP addressed Status 01/17/22: independent with initial HEP Goal status: MET    LONG TERM GOALS: Target date: 04/25/2022   Patient will score 56 on FOTO. Baseline: 45 02/28/2022: not assessed Goal status: DEFERRED TO NEXT VISIT   2.  Patient will demonstrate >/= 170 degrees right  shoulder flexion and abduction AROM to improve ability to reach for items overhead in kitchen cabinet.  Baseline: unable to perform 02/28/2022: approx 150 deg PROM Goal status: ONGOING   3.  Patient will demonstrate 90 degrees right shoulder ER AROM to improve ability to reach [facility] without limitations. Baseline: unable to perform 02/28/2022: approx 70 deg PROM Goal status: ONGOING   4.  Patient will demonstrate improved erect posture to assist with reaching [place] without limitations.  Baseline: rounded shoulders 02/28/2022: rounded shoulders  Goal status: ONGOING   5.  Patient will demonstrate >/= 4/5 right shoulder MMT to perform ADLs at home.  Baseline: unable to assess due to surgical protocol 02/28/2022: not assessed per protocol Goal status: DEFERRED    PLAN: PT FREQUENCY: 1-2x/week   PT DURATION: 8 weeks   PLANNED INTERVENTIONS: Therapeutic exercises, Therapeutic activity, Neuromuscular re-education, Patient/Family education, Joint mobilization, Dry Needling, Electrical stimulation, Cryotherapy, Moist heat, scar mobilization, Taping, and Manual therapy   PLAN FOR NEXT SESSION: Review HEP and progress PRN, continue manual and PROM to improve shoulder mobility, begin isometrics and progress AROM   Hilda Blades, PT, DPT, LAT, ATC 04/01/22  12:16 PM Phone: [phone] Fax: [phone]

## 2022-04-04 ENCOUNTER — Ambulatory Visit: Payer: 59 | Admitting: Physical Therapy

## 2022-04-04 ENCOUNTER — Other Ambulatory Visit: Payer: Self-pay

## 2022-04-04 ENCOUNTER — Encounter: Payer: Self-pay | Admitting: Physical Therapy

## 2022-04-04 DIAGNOSIS — M6281 Muscle weakness (generalized): Secondary | ICD-10-CM

## 2022-04-04 DIAGNOSIS — R293 Abnormal posture: Secondary | ICD-10-CM

## 2022-04-04 DIAGNOSIS — M25511 Pain in right shoulder: Secondary | ICD-10-CM

## 2022-04-04 NOTE — Therapy (Signed)
OUTPATIENT PHYSICAL THERAPY TREATMENT NOTE    Patient Name: Larry Cruz MRN: [id number] DOB:1971/06/27, 51 y.o., male Today's Date: 04/04/2022  PCP: Rita Ohara, MD  REFERRING PROVIDER: Hiram Gash, MD     PT End of Session - 04/04/22 1534     Visit Number 18    Number of Visits 26    Date for PT Re-Evaluation 04/25/22    Authorization Type [employer]    Progress Note Due on Visit 20    PT Start Time 1530    PT Stop Time 1615    PT Time Calculation (min) 45 min    Activity Tolerance Patient tolerated treatment well    Behavior During Therapy [facility] for tasks assessed/performed                        Past Medical History:  Diagnosis Date   Aseptic meningitis    Colorblind    Dyslipidemia    Headache    Herpes genitalis in men    Hx of elbow surgery 09/02/2019   right elbow percutaneous ECRB release/lateral elbow percutaneous release-Dr.Gramig   Hyperlipidemia    Lateral epicondylitis of right elbow 03/2018   s/p injection by Dr. Micheline Chapman; MRI 10/2018, f/b eval by Dr. Amedeo Plenty   Low back pain    Meningitis 2019   negative cultures   Torn tendon    R elbow   Past Surgical History:  Procedure Laterality Date   CERVICAL DISC SURGERY  2009   JONES, MD; fusion   ELBOW SURGERY  09/02/2019   right elbow percutaneous ECRB release/lateral elbow percutaneous release-Dr. [place] Right    percutaneous--done by Dr. Amedeo Plenty in either 08/2019 or 09/2019   LUMBAR DISC SURGERY  2000   CARTER, MD; L4-L5 (right)   LUMBAR DISC SURGERY  11/2013   L4-5, Dr. Ronnald Ramp (left)   SHOULDER ARTHROSCOPY Right 12/30/2021   Procedure: ARTHROSCOPY SHOULDER/DEBRIDEMENT;  Surgeon: Hiram Gash, MD;  Location: [place];  Service: Orthopedics;  Laterality: Right;   SHOULDER ARTHROSCOPY WITH DISTAL CLAVICLE RESECTION Right 12/30/2021   Procedure: SHOULDER ARTHROSCOPY WITH DISTAL CLAVICLE EXCISION;  Surgeon: Hiram Gash, MD;  Location:  [place];  Service: Orthopedics;  Laterality: Right;   SHOULDER ARTHROSCOPY WITH SUBACROMIAL DECOMPRESSION, ROTATOR CUFF REPAIR AND BICEP TENDON REPAIR Right 12/30/2021   Procedure: SHOULDER ARTHROSCOPY WITH SUBACROMIAL DECOMPRESSION, ROTATOR CUFF REPAIR AND BICEP TENDON REPAIR;  Surgeon: Hiram Gash, MD;  Location: [place];  Service: Orthopedics;  Laterality: Right;   VASECTOMY  11/09   Patient Active Problem List   Diagnosis Date Noted   Mixed hyperlipidemia 12/21/2020   Intractable chronic migraine without aura and without status migrainosus 10/14/2018   Intractable headache 07/17/2018   Aseptic meningitis 04/23/2018   Left knee pain 12/08/2016   Plantar fasciitis of left foot 12/29/2015   Cavus deformity of foot 12/29/2015   Pure hypercholesterolemia 06/23/2011    REFERRING PROVIDER: Hiram Gash, MD   REFERRING DIAG: S/P right shoulder arthroscopy with subacromial decompression, distal clavicle excision, biceps  THERAPY DIAG:  Right shoulder pain, unspecified chronicity  Abnormal posture  Muscle weakness (generalized)  PERTINENT HISTORY: S/P right shoulder arthroscopy rotator cuff repiar with subacromial decompression, distal clavicle excision, biceps on 12/30/21  PRECAUTIONS: see protocol   SUBJECTIVE: Patient reports he is doing well. Shoulder pain remains low overall but does continue to have pain with certain movements.  PAIN:  Are you having pain? Yes:  NPRS scale: 1/10  Pain location: Right shoulder Pain description: Sharp, tight Relieving factors: Ice, medication Aggravating Factors: Shoulder movement  PATIENT GOALS "Pain free and full use of arm."   OBJECTIVE: (objective measures completed at initial evaluation unless otherwise dated) PATIENT SURVEYS:  FOTO 45, predicted 56   POSTURE: Rounded shoulders    UPPER EXTREMITY ROM:    Passive ROM Right 01/03/2022 Left 01/03/2022 Right  01/17/22 Right 01/26/22  Right 02/02/2022 Right 02/09/22 Right   02/16/22 Right  AROM 02/16/22 03/23/2022 03/29/2022  Shoulder flexion 90 PROM 170 AROM 95 PROM 110 PROM at table  95 PROM supine 100 deg PROM 110 AAROM supine 117 flexion AAROM/130 scaption AAROM 115 PROM 155 deg PROM 155 deg AROM 130 deg  Shoulder extension              Shoulder abduction 95 PROM 170 AROM 60 Per protocol   60 deg PROM 75 PROM 90 PROM Scaption 105    Shoulder adduction              Shoulder internal rotation 67 PROM 70 AROM      Reaches right upper gluteal    Shoulder external rotation 46 PROM 90 AROM 20 PROM 12 PROM at neutral 35-40 deg @ 45 deg scapular plane PROM 30 degrees @ 45 degrees PROM 35 @ 45 degrees AAROM Reaches occiput    Elbow flexion       WFL       Elbow extension       WFL       (Blank rows = not tested)   UPPER EXTREMITY MMT:   MMT Right 01/03/2022 Left 01/03/2022  Shoulder flexion      Shoulder extension      Shoulder abduction      Shoulder adduction      Shoulder internal rotation      Shoulder external rotation      Middle trapezius      Lower trapezius      Elbow flexion      Elbow extension      Wrist flexion      Wrist extension      Wrist ulnar deviation      Wrist radial deviation      Wrist pronation      Wrist supination      Grip strength (lbs)      (Blank rows = not tested)             **deterred due to protocol   PALPATION:  TTP over incision over anterior right shoulder and biceps              TODAY'S TREATMENT:  [facility] Adult PT Treatment:                                                DATE: 04/04/2022 Therapeutic Exercise: UBE L1 x 4 min (2 fwd/bwd) while taking subjective Supine flexion with 3# x 10, 5# x 10 Reclined flexion at 45 deg 2# x 10, 3# x 10, 5# x 10 Standing shoulder scaption at wall to 90 deg 2# x 10, 3# x 10 Low row machine 2 x 15 ER with red 2 x 15 each Extension with green 2 x 15 (palm forward) Scapular protraction/retraction at wall x 15 Scapular  protraction/retraction at counter x  10 Standing wall slide flexion with thumbs back 10 x 5 sec Doorway pec stretch at 90 deg abd 3 x 20 sec Manual Therapy: Supine GHJ mobs primarily in posterior and inferior directions at various ranges of shoulder elevation PROM right shoulder all directions as tolerated Scap pinned shoulder flexion and cross body stretch   [facility] Adult PT Treatment:                                                DATE: 04/01/2022 Therapeutic Exercise: UBE L1 x 4 min (2 fwd/bwd) while taking subjective Wall slide flexion 10 x 5 sec Row with green 2 x 20 ER/IR with red 2 x 15 each Extension with green 2 x 15 Scaption to 90 with 2# back at wall 2 x 15 Scapular protraction/retraction at wall 2 x 15 Sidelying flexion 2 x 10 Sidelying abduction 2 x 10 Sidelying horizontal abduction 2 x 10 Manual Therapy: Supine GHJ mobs primarily in posterior and inferior directions at various ranges of shoulder elevation PROM right shoulder all directions as tolerated Scap pinned shoulder flexion and cross body stretch  [facility] Adult PT Treatment:                                                DATE: 03/29/2022 Therapeutic Exercise: UBE L1 x 4 min (2 fwd/bwd) while taking subjective Supine flexion with physioball x 10, with 2# x 10, 3# x 10 Reclined (45 deg) shoulder flexion with 1# x 10, 2# x 10, 3# x 10 Sidelying shoulder flexion 2 x 10 Standing shoulder flexion with back at wall x 10, with opposite arm overpressure x 10 Manual Therapy: Supine GHJ mobs primarily in posterior and inferior directions at various ranges of shoulder elevation PROM right shoulder all directions as tolerated Scap pinned shoulder flexion and cross body stretch  [facility] Adult PT Treatment:                                                DATE: 03/25/2022 Therapeutic Exercise: UBE L1 x 4 min (2 fwd/bwd) while taking subjective Sidelying shoulder flexion 2 x 10 Sidelying shoulder abduction 2 x 10 Supine shoulder  flexion holding physioball x 10 Supine scapular protraction/retraction holding physioball x 10 Quadruped rock back for shoulder flexion stretch x 10 Quadruped scapular protraction/retraction x 10 Manual Therapy: Supine GHJ mobs primarily in posterior and inferior directions at various ranges of shoulder elevation PROM right shoulder all directions as tolerated Sidelying scapulothoracic mobs all directions Vasopneumatic ([employer]): (not billed) Location: Right shoulder Time: 15 min Temperature: 34 deg Pressure: Medium    PATIENT EDUCATION: Education details: HEP Person educated: Patient Education method: Consulting civil engineer, Demonstration, Corporate treasurer cues, Verbal cues Education comprehension: verbalized understanding   HOME EXERCISE PROGRAM: Access Code: [email or web address]    ASSESSMENT: CLINICAL IMPRESSION: Patient tolerated therapy well with no adverse effects. He continues to progress well with his shoulder strengthening and seems to be demonstrating improved range of motion. He reported fatigue and some discomfort at top of the shoulder that seems to be impingement related. Overall he seems to be doing well  and and tolerating progressions in strength and mobility well. Patient would benefit from continued skilled PT to reduce pain and progress his shoulder mobility and active motion as protocol allows in order to maximize functional ability.     OBJECTIVE IMPAIRMENTS decreased ROM, decreased strength, impaired UE functional use, and pain.    ACTIVITY LIMITATIONS cleaning, meal prep, occupation, and yard work.    PERSONAL FACTORS Time since onset of injury/illness/exacerbation and 1 comorbidity: hyperlipidemia  are also affecting patient's functional outcome.      GOALS: SHORT TERM GOALS: Target date: 01/17/2022   Patient will be independent with HEP for PT progression. Baseline: initial HEP addressed Status 01/17/22: independent with initial HEP Goal status: MET    LONG TERM GOALS: Target  date: 04/25/2022   Patient will score 56 on FOTO. Baseline: 45 02/28/2022: not assessed Goal status: DEFERRED TO NEXT VISIT   2.  Patient will demonstrate >/= 170 degrees right shoulder flexion and abduction AROM to improve ability to reach for items overhead in kitchen cabinet.  Baseline: unable to perform 02/28/2022: approx 150 deg PROM Goal status: ONGOING   3.  Patient will demonstrate 90 degrees right shoulder ER AROM to improve ability to reach [facility] without limitations. Baseline: unable to perform 02/28/2022: approx 70 deg PROM Goal status: ONGOING   4.  Patient will demonstrate improved erect posture to assist with reaching [place] without limitations.  Baseline: rounded shoulders 02/28/2022: rounded shoulders  Goal status: ONGOING   5.  Patient will demonstrate >/= 4/5 right shoulder MMT to perform ADLs at home.  Baseline: unable to assess due to surgical protocol 02/28/2022: not assessed per protocol Goal status: DEFERRED    PLAN: PT FREQUENCY: 1-2x/week   PT DURATION: 8 weeks   PLANNED INTERVENTIONS: Therapeutic exercises, Therapeutic activity, Neuromuscular re-education, Patient/Family education, Joint mobilization, Dry Needling, Electrical stimulation, Cryotherapy, Moist heat, scar mobilization, Taping, and Manual therapy   PLAN FOR NEXT SESSION: Review HEP and progress PRN, continue manual and PROM to improve shoulder mobility, begin isometrics and progress AROM   Hilda Blades, PT, DPT, LAT, ATC 04/04/22  4:17 PM Phone: [phone] Fax: [phone]

## 2022-04-06 ENCOUNTER — Encounter: Payer: 59 | Admitting: Physical Therapy

## 2022-04-14 NOTE — Therapy (Signed)
OUTPATIENT PHYSICAL THERAPY TREATMENT NOTE    Patient Name: Larry Cruz MRN: [id number] DOB:[birth year], 51 y.o., male Today's Date: 04/15/2022  PCP: Rita Ohara, MD  REFERRING PROVIDER: Hiram Gash, MD     PT End of Session - 04/15/22 1135     Visit Number 19    Number of Visits 26    Date for PT Re-Evaluation 04/25/22    Authorization Type [employer]    Progress Note Due on Visit 20    PT Start Time 1130    PT Stop Time 1215    PT Time Calculation (min) 45 min    Activity Tolerance Patient tolerated treatment well    Behavior During Therapy [facility] for tasks assessed/performed                         Past Medical History:  Diagnosis Date   Aseptic meningitis    Colorblind    Dyslipidemia    Headache    Herpes genitalis in men    Hx of elbow surgery 09/02/2019   right elbow percutaneous ECRB release/lateral elbow percutaneous release-Dr.Gramig   Hyperlipidemia    Lateral epicondylitis of right elbow 03/2018   s/p injection by Dr. Micheline Chapman; MRI 10/2018, f/b eval by Dr. Amedeo Plenty   Low back pain    Meningitis 2019   negative cultures   Torn tendon    R elbow   Past Surgical History:  Procedure Laterality Date   CERVICAL DISC SURGERY  2009   JONES, MD; fusion   ELBOW SURGERY  09/02/2019   right elbow percutaneous ECRB release/lateral elbow percutaneous release-Dr. Marion Right    percutaneous--done by Dr. Amedeo Plenty in either 08/2019 or 09/2019   LUMBAR DISC SURGERY  2000   CARTER, MD; L4-L5 (right)   LUMBAR DISC SURGERY  11/2013   L4-5, Dr. Ronnald Ramp (left)   SHOULDER ARTHROSCOPY Right 12/30/2021   Procedure: ARTHROSCOPY SHOULDER/DEBRIDEMENT;  Surgeon: Hiram Gash, MD;  Location: [place];  Service: Orthopedics;  Laterality: Right;   SHOULDER ARTHROSCOPY WITH DISTAL CLAVICLE RESECTION Right 12/30/2021   Procedure: SHOULDER ARTHROSCOPY WITH DISTAL CLAVICLE EXCISION;  Surgeon: Hiram Gash, MD;  Location:  [place];  Service: Orthopedics;  Laterality: Right;   SHOULDER ARTHROSCOPY WITH SUBACROMIAL DECOMPRESSION, ROTATOR CUFF REPAIR AND BICEP TENDON REPAIR Right 12/30/2021   Procedure: SHOULDER ARTHROSCOPY WITH SUBACROMIAL DECOMPRESSION, ROTATOR CUFF REPAIR AND BICEP TENDON REPAIR;  Surgeon: Hiram Gash, MD;  Location: [place];  Service: Orthopedics;  Laterality: Right;   VASECTOMY  11/09   Patient Active Problem List   Diagnosis Date Noted   Mixed hyperlipidemia 12/21/2020   Intractable chronic migraine without aura and without status migrainosus 10/14/2018   Intractable headache 07/17/2018   Aseptic meningitis 04/23/2018   Left knee pain 12/08/2016   Plantar fasciitis of left foot 12/29/2015   Cavus deformity of foot 12/29/2015   Pure hypercholesterolemia 06/23/2011    REFERRING PROVIDER: Hiram Gash, MD   REFERRING DIAG: S/P right shoulder arthroscopy with subacromial decompression, distal clavicle excision, biceps  THERAPY DIAG:  Right shoulder pain, unspecified chronicity  Abnormal posture  Muscle weakness (generalized)  PERTINENT HISTORY: S/P right shoulder arthroscopy rotator cuff repiar with subacromial decompression, distal clavicle excision, biceps on 12/30/21  PRECAUTIONS: see protocol   SUBJECTIVE: Patient reports he is doing well. He is able to sleep on his right shoulder some.   PAIN:  Are  you having pain? Yes:  NPRS scale: 0/10  Pain location: Right shoulder Pain description: Sharp, tight Relieving factors: Ice, medication Aggravating Factors: Shoulder movement  PATIENT GOALS "Pain free and full use of arm."   OBJECTIVE: (objective measures completed at initial evaluation unless otherwise dated) PATIENT SURVEYS:  FOTO 45, predicted 56  04/15/2022: 74%   POSTURE: Rounded shoulders    UPPER EXTREMITY ROM:    Passive ROM Right 01/03/2022 Left 01/03/2022 Right  01/17/22 Right 01/26/22 Right 02/02/2022 Right 02/09/22  Right   02/16/22 Right  AROM 02/16/22 03/23/2022 03/29/2022  Shoulder flexion 90 PROM 170 AROM 95 PROM 110 PROM at table  95 PROM supine 100 deg PROM 110 AAROM supine 117 flexion AAROM/130 scaption AAROM 115 PROM 155 deg PROM 155 deg AROM 130 deg  Shoulder extension              Shoulder abduction 95 PROM 170 AROM 60 Per protocol   60 deg PROM 75 PROM 90 PROM Scaption 105    Shoulder adduction              Shoulder internal rotation 67 PROM 70 AROM      Reaches right upper gluteal    Shoulder external rotation 46 PROM 90 AROM 20 PROM 12 PROM at neutral 35-40 deg @ 45 deg scapular plane PROM 30 degrees @ 45 degrees PROM 35 @ 45 degrees AAROM Reaches occiput    Elbow flexion       WFL       Elbow extension       WFL       (Blank rows = not tested)   UPPER EXTREMITY MMT:   MMT Right 01/03/2022 Left 01/03/2022  Shoulder flexion      Shoulder extension      Shoulder abduction      Shoulder adduction      Shoulder internal rotation      Shoulder external rotation      Middle trapezius      Lower trapezius      Elbow flexion      Elbow extension      Wrist flexion      Wrist extension      Wrist ulnar deviation      Wrist radial deviation      Wrist pronation      Wrist supination      Grip strength (lbs)      (Blank rows = not tested)             **deterred due to protocol   PALPATION:  TTP over incision over anterior right shoulder and biceps              TODAY'S TREATMENT:  [facility] Adult PT Treatment:                                                DATE: 04/15/2022 Therapeutic Exercise: UBE L1 x 4 min (2 fwd/bwd) while taking subjective Supine flexion with 5# 2 x 10 Supine serratus punch 2 x 10 Shouder ER/IR with green 2 x 15 Row with blue 2 x 15 Extension with green 2 x 10 Manual Therapy: Skilled palpation and monitoring of muscle tension while performing TPDN treatment Supine GHJ mobs primarily in posterior and inferior directions at various ranges of shoulder  elevation PROM right shoulder all directions as tolerated  Scap pinned shoulder flexion and cross body stretch Trigger Point Dry Needling Treatment: Pre-treatment instruction: Patient instructed on dry needling rationale, procedures, and possible side effects including pain during treatment (achy,cramping feeling), bruising, drop of blood, lightheadedness, nausea, sweating. Patient Consent Given: Yes Education handout provided: No Muscles treated: Right infraspinatus, right teres, posterior deltoid  Needle size and number: .30x38m x 6 Electrical stimulation performed: No Parameters: N/A Treatment response/outcome: Twitch response elicited and Palpable decrease in muscle tension Post-treatment instructions: Patient instructed to expect possible mild to moderate muscle soreness later today and/or tomorrow. Patient instructed in methods to reduce muscle soreness and to continue prescribed HEP. If patient was dry needled over the lung field, patient was instructed on signs and symptoms of pneumothorax and, however unlikely, to see immediate medical attention should they occur. Patient was also educated on signs and symptoms of infection and to seek medical attention should they occur. Patient verbalized understanding of these instructions and education.   [facility]                                                DATE: 04/04/2022 Therapeutic Exercise: UBE L1 x 4 min (2 fwd/bwd) while taking subjective Supine flexion with 3# x 10, 5# x 10 Reclined flexion at 45 deg 2# x 10, 3# x 10, 5# x 10 Standing shoulder scaption at wall to 90 deg 2# x 10, 3# x 10 Low row machine 2 x 15 ER with red 2 x 15 each Extension with green 2 x 15 (palm forward) Scapular protraction/retraction at wall x 15 Scapular protraction/retraction at counter x 10 Standing wall slide flexion with thumbs back 10 x 5 sec Doorway pec stretch at 90 deg abd 3 x 20 sec Manual Therapy: Supine GHJ mobs primarily in  posterior and inferior directions at various ranges of shoulder elevation PROM right shoulder all directions as tolerated Scap pinned shoulder flexion and cross body stretch  [facility] Adult PT Treatment:                                                DATE: 04/01/2022 Therapeutic Exercise: UBE L1 x 4 min (2 fwd/bwd) while taking subjective Wall slide flexion 10 x 5 sec Row with green 2 x 20 ER/IR with red 2 x 15 each Extension with green 2 x 15 Scaption to 90 with 2# back at wall 2 x 15 Scapular protraction/retraction at wall 2 x 15 Sidelying flexion 2 x 10 Sidelying abduction 2 x 10 Sidelying horizontal abduction 2 x 10 Manual Therapy: Supine GHJ mobs primarily in posterior and inferior directions at various ranges of shoulder elevation PROM right shoulder all directions as tolerated Scap pinned shoulder flexion and cross body stretch    PATIENT EDUCATION: Education details: HEP Person educated: Patient Education method: EConsulting civil engineer Demonstration, TCorporate treasurercues, Verbal cues Education comprehension: verbalized understanding   HOME EXERCISE PROGRAM: Access Code: [email or web address]   ASSESSMENT: CLINICAL IMPRESSION: Patient tolerated therapy well with no adverse effects. Performed TPDN this visit for posterior cuff region with twitch response elicited and reduction in pain with shoulder motion. He is progressing well with his range of motion and strengthening exercises, but does continue to exhibit limitations with end range motion  and pain, and gross strength and endurance deficits of the right shoulder. No changes made to HEP this visit. Patient would benefit from continued skilled PT to reduce pain and progress his shoulder mobility and active motion as protocol allows in order to maximize functional ability.     OBJECTIVE IMPAIRMENTS decreased ROM, decreased strength, impaired UE functional use, and pain.    ACTIVITY LIMITATIONS cleaning, meal prep, occupation, and yard work.    PERSONAL  FACTORS Time since onset of injury/illness/exacerbation and 1 comorbidity: hyperlipidemia  are also affecting patient's functional outcome.      GOALS: SHORT TERM GOALS: Target date: 01/17/2022   Patient will be independent with HEP for PT progression. Baseline: initial HEP addressed Status 01/17/22: independent with initial HEP Goal status: MET    LONG TERM GOALS: Target date: 04/25/2022   Patient will score 56 on FOTO. Baseline: 45 02/28/2022: not assessed 04/15/2022: 74% Goal status: MET   2.  Patient will demonstrate >/= 170 degrees right shoulder flexion and abduction AROM to improve ability to reach for items overhead in kitchen cabinet.  Baseline: unable to perform 02/28/2022: approx 150 deg PROM Goal status: ONGOING   3.  Patient will demonstrate 90 degrees right shoulder ER AROM to improve ability to reach [facility] without limitations. Baseline: unable to perform 02/28/2022: approx 70 deg PROM Goal status: ONGOING   4.  Patient will demonstrate improved erect posture to assist with reaching [place] without limitations.  Baseline: rounded shoulders 02/28/2022: rounded shoulders  Goal status: ONGOING   5.  Patient will demonstrate >/= 4/5 right shoulder MMT to perform ADLs at home.  Baseline: unable to assess due to surgical protocol 02/28/2022: not assessed per protocol Goal status: DEFERRED    PLAN: PT FREQUENCY: 1-2x/week   PT DURATION: 8 weeks   PLANNED INTERVENTIONS: Therapeutic exercises, Therapeutic activity, Neuromuscular re-education, Patient/Family education, Joint mobilization, Dry Needling, Electrical stimulation, Cryotherapy, Moist heat, scar mobilization, Taping, and Manual therapy   PLAN FOR NEXT SESSION: Review HEP and progress PRN, continue manual and PROM to improve shoulder mobility, begin isometrics and progress AROM   Hilda Blades, PT, DPT, LAT, ATC 04/15/22  12:32 PM Phone: [phone] Fax: [phone]

## 2022-04-15 ENCOUNTER — Other Ambulatory Visit: Payer: Self-pay

## 2022-04-15 ENCOUNTER — Ambulatory Visit: Payer: 59 | Admitting: Physical Therapy

## 2022-04-15 ENCOUNTER — Encounter: Payer: Self-pay | Admitting: Physical Therapy

## 2022-04-15 DIAGNOSIS — M6281 Muscle weakness (generalized): Secondary | ICD-10-CM | POA: Diagnosis not present

## 2022-04-15 DIAGNOSIS — R293 Abnormal posture: Secondary | ICD-10-CM

## 2022-04-15 DIAGNOSIS — M25511 Pain in right shoulder: Secondary | ICD-10-CM

## 2022-04-21 ENCOUNTER — Ambulatory Visit: Payer: 59 | Attending: Orthopaedic Surgery | Admitting: Physical Therapy

## 2022-04-21 ENCOUNTER — Other Ambulatory Visit: Payer: Self-pay

## 2022-04-21 ENCOUNTER — Encounter: Payer: Self-pay | Admitting: Physical Therapy

## 2022-04-21 DIAGNOSIS — M6281 Muscle weakness (generalized): Secondary | ICD-10-CM | POA: Insufficient documentation

## 2022-04-21 DIAGNOSIS — R293 Abnormal posture: Secondary | ICD-10-CM | POA: Diagnosis not present

## 2022-04-21 DIAGNOSIS — M25511 Pain in right shoulder: Secondary | ICD-10-CM | POA: Diagnosis not present

## 2022-04-21 NOTE — Therapy (Signed)
OUTPATIENT PHYSICAL THERAPY TREATMENT NOTE    Patient Name: Larry Cruz MRN: [id number] DOB:1970-12-04, 51 y.o., male Today's Date: 04/21/2022  PCP: Rita Ohara, MD  REFERRING PROVIDER: Hiram Gash, MD     PT End of Session - 04/21/22 1404     Visit Number 20    Number of Visits 26    Date for PT Re-Evaluation 04/25/22    Authorization Type [employer]    Progress Note Due on Visit 20    PT Start Time 1400    PT Stop Time 1445    PT Time Calculation (min) 45 min    Activity Tolerance Patient tolerated treatment well    Behavior During Therapy [facility] for tasks assessed/performed                          Past Medical History:  Diagnosis Date   Aseptic meningitis    Colorblind    Dyslipidemia    Headache    Herpes genitalis in men    Hx of elbow surgery 09/02/2019   right elbow percutaneous ECRB release/lateral elbow percutaneous release-Dr.Gramig   Hyperlipidemia    Lateral epicondylitis of right elbow 03/2018   s/p injection by Dr. Micheline Chapman; MRI 10/2018, f/b eval by Dr. Amedeo Plenty   Low back pain    Meningitis 2019   negative cultures   Torn tendon    R elbow   Past Surgical History:  Procedure Laterality Date   CERVICAL DISC SURGERY  2009   JONES, MD; fusion   ELBOW SURGERY  09/02/2019   right elbow percutaneous ECRB release/lateral elbow percutaneous release-Dr. Evangeline Right    percutaneous--done by Dr. Amedeo Plenty in either 08/2019 or 09/2019   LUMBAR DISC SURGERY  2000   CARTER, MD; L4-L5 (right)   LUMBAR DISC SURGERY  11/2013   L4-5, Dr. Ronnald Ramp (left)   SHOULDER ARTHROSCOPY Right 12/30/2021   Procedure: ARTHROSCOPY SHOULDER/DEBRIDEMENT;  Surgeon: Hiram Gash, MD;  Location: [place];  Service: Orthopedics;  Laterality: Right;   SHOULDER ARTHROSCOPY WITH DISTAL CLAVICLE RESECTION Right 12/30/2021   Procedure: SHOULDER ARTHROSCOPY WITH DISTAL CLAVICLE EXCISION;  Surgeon: Hiram Gash, MD;  Location:  [place];  Service: Orthopedics;  Laterality: Right;   SHOULDER ARTHROSCOPY WITH SUBACROMIAL DECOMPRESSION, ROTATOR CUFF REPAIR AND BICEP TENDON REPAIR Right 12/30/2021   Procedure: SHOULDER ARTHROSCOPY WITH SUBACROMIAL DECOMPRESSION, ROTATOR CUFF REPAIR AND BICEP TENDON REPAIR;  Surgeon: Hiram Gash, MD;  Location: [place];  Service: Orthopedics;  Laterality: Right;   VASECTOMY  11/09   Patient Active Problem List   Diagnosis Date Noted   Mixed hyperlipidemia 12/21/2020   Intractable chronic migraine without aura and without status migrainosus 10/14/2018   Intractable headache 07/17/2018   Aseptic meningitis 04/23/2018   Left knee pain 12/08/2016   Plantar fasciitis of left foot 12/29/2015   Cavus deformity of foot 12/29/2015   Pure hypercholesterolemia 06/23/2011    REFERRING PROVIDER: Hiram Gash, MD   REFERRING DIAG: S/P right shoulder arthroscopy with subacromial decompression, distal clavicle excision, biceps  THERAPY DIAG:  Right shoulder pain, unspecified chronicity  Muscle weakness (generalized)  Abnormal posture  PERTINENT HISTORY: S/P right shoulder arthroscopy rotator cuff repiar with subacromial decompression, distal clavicle excision, biceps on 12/30/21  PRECAUTIONS: see protocol   SUBJECTIVE: Patient reports he continues to do well. He is sleeping better and doesn't think about his shoulder due to pain.  He reports no real change following dry needling last visit.  PAIN:  Are you having pain? Yes:  NPRS scale: 0/10  Pain location: Right shoulder Pain description: Sharp, tight Relieving factors: Ice, medication Aggravating Factors: Shoulder movement  PATIENT GOALS "Pain free and full use of arm."   OBJECTIVE: (objective measures completed at initial evaluation unless otherwise dated) PATIENT SURVEYS:  FOTO 45, predicted 56  04/15/2022: 74%   POSTURE: Rounded shoulders    UPPER EXTREMITY ROM:    Passive ROM  Right 01/03/2022 Left 01/03/2022 Right  01/17/22 Right 01/26/22 Right 02/02/2022 Right 02/09/22 Right   02/16/22 Right  AROM 02/16/22 03/23/2022 03/29/2022  Shoulder flexion 90 PROM 170 AROM 95 PROM 110 PROM at table  95 PROM supine 100 deg PROM 110 AAROM supine 117 flexion AAROM/130 scaption AAROM 115 PROM 155 deg PROM 155 deg AROM 130 deg  Shoulder extension              Shoulder abduction 95 PROM 170 AROM 60 Per protocol   60 deg PROM 75 PROM 90 PROM Scaption 105    Shoulder adduction              Shoulder internal rotation 67 PROM 70 AROM      Reaches right upper gluteal    Shoulder external rotation 46 PROM 90 AROM 20 PROM 12 PROM at neutral 35-40 deg @ 45 deg scapular plane PROM 30 degrees @ 45 degrees PROM 35 @ 45 degrees AAROM Reaches occiput    Elbow flexion       WFL       Elbow extension       WFL       (Blank rows = not tested)   UPPER EXTREMITY MMT:   MMT Right 01/03/2022 Left 01/03/2022  Shoulder flexion      Shoulder extension      Shoulder abduction      Shoulder adduction      Shoulder internal rotation      Shoulder external rotation      Middle trapezius      Lower trapezius      Elbow flexion      Elbow extension      Wrist flexion      Wrist extension      Wrist ulnar deviation      Wrist radial deviation      Wrist pronation      Wrist supination      Grip strength (lbs)      (Blank rows = not tested)             **deterred due to protocol   PALPATION:  TTP over incision over anterior right shoulder and biceps              TODAY'S TREATMENT:  [facility] Adult PT Treatment:                                                DATE: 04/21/2022 Therapeutic Exercise: UBE L1 x 4 min (2 fwd/bwd) while taking subjective Supine flexion with 5# 2 x 10 Inclined flexion 3# x 10, 5# x 10 Standing scaption to 90 deg 3# 2 x 10 Shouder ER/IR with green 2 x 15 Alternating shoulder taps in plank position at counter 2 x 10 Low row machine 45# 2 x 15 Lat pull down machine 45#  2 x 10 Standing banded taps at wall with red band around wrists 2 x 5 each Manual Therapy: Supine GHJ mobs primarily in posterior and inferior directions at various ranges of shoulder elevation PROM right shoulder all directions as tolerated Scap pinned shoulder flexion and cross body stretch   [facility] Adult PT Treatment:                                                DATE: 04/15/2022 Therapeutic Exercise: UBE L1 x 4 min (2 fwd/bwd) while taking subjective Supine flexion with 5# 2 x 10 Supine serratus punch 2 x 10 Shouder ER/IR with green 2 x 15 Row with blue 2 x 15 Extension with green 2 x 10 Manual Therapy: Skilled palpation and monitoring of muscle tension while performing TPDN treatment Supine GHJ mobs primarily in posterior and inferior directions at various ranges of shoulder elevation PROM right shoulder all directions as tolerated Scap pinned shoulder flexion and cross body stretch Trigger Point Dry Needling Treatment: Pre-treatment instruction: Patient instructed on dry needling rationale, procedures, and possible side effects including pain during treatment (achy,cramping feeling), bruising, drop of blood, lightheadedness, nausea, sweating. Patient Consent Given: Yes Education handout provided: No Muscles treated: Right infraspinatus, right teres, posterior deltoid  Needle size and number: .30x55mm x 6 Electrical stimulation performed: No Parameters: N/A Treatment response/outcome: Twitch response elicited and Palpable decrease in muscle tension Post-treatment instructions: Patient instructed to expect possible mild to moderate muscle soreness later today and/or tomorrow. Patient instructed in methods to reduce muscle soreness and to continue prescribed HEP. If patient was dry needled over the lung field, patient was instructed on signs and symptoms of pneumothorax and, however unlikely, to see immediate medical attention should they occur. Patient was also educated on signs and  symptoms of infection and to seek medical attention should they occur. Patient verbalized understanding of these instructions and education.  [facility] Adult PT Treatment:                                                DATE: 04/04/2022 Therapeutic Exercise: UBE L1 x 4 min (2 fwd/bwd) while taking subjective Supine flexion with 3# x 10, 5# x 10 Reclined flexion at 45 deg 2# x 10, 3# x 10, 5# x 10 Standing shoulder scaption at wall to 90 deg 2# x 10, 3# x 10 Low row machine 2 x 15 ER with red 2 x 15 each Extension with green 2 x 15 (palm forward) Scapular protraction/retraction at wall x 15 Scapular protraction/retraction at counter x 10 Standing wall slide flexion with thumbs back 10 x 5 sec Doorway pec stretch at 90 deg abd 3 x 20 sec Manual Therapy: Supine GHJ mobs primarily in posterior and inferior directions at various ranges of shoulder elevation PROM right shoulder all directions as tolerated Scap pinned shoulder flexion and cross body stretch    PATIENT EDUCATION: Education details: HEP Person educated: Patient Education method: Consulting civil engineer, Media planner, Corporate treasurer cues, Verbal cues Education comprehension: verbalized understanding   HOME EXERCISE PROGRAM: Access Code: [email or web address]    ASSESSMENT: CLINICAL IMPRESSION: Patient tolerated therapy well with no adverse effects. Therapy continues to focus on progress right shoulder mobility and strengthening. He is progressing well with strengthening  and is tolerating increased resistance. He does report right shoulder fatigue and discomfort with exercises. Patient would benefit from continued skilled PT to reduce pain and progress his shoulder mobility and active motion as protocol allows in order to maximize functional ability.     OBJECTIVE IMPAIRMENTS decreased ROM, decreased strength, impaired UE functional use, and pain.    ACTIVITY LIMITATIONS cleaning, meal prep, occupation, and yard work.    PERSONAL FACTORS Time since onset of  injury/illness/exacerbation and 1 comorbidity: hyperlipidemia  are also affecting patient's functional outcome.      GOALS: SHORT TERM GOALS: Target date: 01/17/2022   Patient will be independent with HEP for PT progression. Baseline: initial HEP addressed Status 01/17/22: independent with initial HEP Goal status: MET    LONG TERM GOALS: Target date: 04/25/2022   Patient will score 56 on FOTO. Baseline: 45 02/28/2022: not assessed 04/15/2022: 74% Goal status: MET   2.  Patient will demonstrate >/= 170 degrees right shoulder flexion and abduction AROM to improve ability to reach for items overhead in kitchen cabinet.  Baseline: unable to perform 02/28/2022: approx 150 deg PROM Goal status: ONGOING   3.  Patient will demonstrate 90 degrees right shoulder ER AROM to improve ability to reach [facility] without limitations. Baseline: unable to perform 02/28/2022: approx 70 deg PROM Goal status: ONGOING   4.  Patient will demonstrate improved erect posture to assist with reaching [place] without limitations.  Baseline: rounded shoulders 02/28/2022: rounded shoulders  Goal status: ONGOING   5.  Patient will demonstrate >/= 4/5 right shoulder MMT to perform ADLs at home.  Baseline: unable to assess due to surgical protocol 02/28/2022: not assessed per protocol Goal status: DEFERRED    PLAN: PT FREQUENCY: 1-2x/week   PT DURATION: 8 weeks   PLANNED INTERVENTIONS: Therapeutic exercises, Therapeutic activity, Neuromuscular re-education, Patient/Family education, Joint mobilization, Dry Needling, Electrical stimulation, Cryotherapy, Moist heat, scar mobilization, Taping, and Manual therapy   PLAN FOR NEXT SESSION: Review HEP and progress PRN, continue manual and PROM to improve shoulder mobility, begin isometrics and progress AROM   Hilda Blades, PT, DPT, LAT, ATC 04/21/22  3:14 PM Phone: [phone] Fax: [phone]

## 2022-04-25 NOTE — Therapy (Signed)
OUTPATIENT PHYSICAL THERAPY TREATMENT NOTE    Patient Name: Larry Cruz MRN: [id number] DOB:December 09, 1970, 51 y.o., male Today's Date: 04/27/2022  PCP: Rita Ohara, MD  REFERRING PROVIDER: Hiram Gash, MD     PT End of Session - 04/27/22 1134     Visit Number 21    Number of Visits 25    Date for PT Re-Evaluation 06/08/22    Authorization Type [employer] [facility]    PT Start Time 1133    PT Stop Time 1212    PT Time Calculation (min) 39 min    Activity Tolerance Patient tolerated treatment well    Behavior During Therapy [facility] for tasks assessed/performed                           Past Medical History:  Diagnosis Date   Aseptic meningitis    Colorblind    Dyslipidemia    Headache    Herpes genitalis in men    Hx of elbow surgery 09/02/2019   right elbow percutaneous ECRB release/lateral elbow percutaneous release-Dr.Gramig   Hyperlipidemia    Lateral epicondylitis of right elbow 03/2018   s/p injection by Dr. Micheline Chapman; MRI 10/2018, f/b eval by Dr. Amedeo Plenty   Low back pain    Meningitis 2019   negative cultures   Torn tendon    R elbow   Past Surgical History:  Procedure Laterality Date   CERVICAL DISC SURGERY  2009   JONES, MD; fusion   ELBOW SURGERY  09/02/2019   right elbow percutaneous ECRB release/lateral elbow percutaneous release-Dr. Walker Right    percutaneous--done by Dr. Amedeo Plenty in either 08/2019 or 09/2019   LUMBAR DISC SURGERY  2000   CARTER, MD; L4-L5 (right)   LUMBAR DISC SURGERY  11/2013   L4-5, Dr. Ronnald Ramp (left)   SHOULDER ARTHROSCOPY Right 12/30/2021   Procedure: ARTHROSCOPY SHOULDER/DEBRIDEMENT;  Surgeon: Hiram Gash, MD;  Location: [place];  Service: Orthopedics;  Laterality: Right;   SHOULDER ARTHROSCOPY WITH DISTAL CLAVICLE RESECTION Right 12/30/2021   Procedure: SHOULDER ARTHROSCOPY WITH DISTAL CLAVICLE EXCISION;  Surgeon: Hiram Gash, MD;  Location: [place];   Service: Orthopedics;  Laterality: Right;   SHOULDER ARTHROSCOPY WITH SUBACROMIAL DECOMPRESSION, ROTATOR CUFF REPAIR AND BICEP TENDON REPAIR Right 12/30/2021   Procedure: SHOULDER ARTHROSCOPY WITH SUBACROMIAL DECOMPRESSION, ROTATOR CUFF REPAIR AND BICEP TENDON REPAIR;  Surgeon: Hiram Gash, MD;  Location: [place];  Service: Orthopedics;  Laterality: Right;   VASECTOMY  11/09   Patient Active Problem List   Diagnosis Date Noted   Mixed hyperlipidemia 12/21/2020   Intractable chronic migraine without aura and without status migrainosus 10/14/2018   Intractable headache 07/17/2018   Aseptic meningitis 04/23/2018   Left knee pain 12/08/2016   Plantar fasciitis of left foot 12/29/2015   Cavus deformity of foot 12/29/2015   Pure hypercholesterolemia 06/23/2011    REFERRING PROVIDER: Hiram Gash, MD   REFERRING DIAG: S/P right shoulder arthroscopy with subacromial decompression, distal clavicle excision, biceps  THERAPY DIAG:  Right shoulder pain, unspecified chronicity  Muscle weakness (generalized)  Abnormal posture  PERTINENT HISTORY: S/P right shoulder arthroscopy rotator cuff repiar with subacromial decompression, distal clavicle excision, biceps on 12/30/21  PRECAUTIONS: see protocol   SUBJECTIVE: Patient reports the past couple days his shoulder has been a little agitated, mainly at end ranges of motion.  PAIN:  Are you having pain? Yes:  NPRS scale: 0/10  Pain location: Right shoulder Pain description: Sharp, tight Relieving factors: Ice, medication Aggravating Factors: Shoulder movement  PATIENT GOALS "Pain free and full use of arm."   OBJECTIVE: (objective measures completed at initial evaluation unless otherwise dated) PATIENT SURVEYS:  FOTO 45, predicted 56  04/15/2022: 74%   POSTURE: Rounded shoulders    UPPER EXTREMITY ROM:    ROM Right 01/03/2022 Left 01/03/2022 Right  01/17/22 Right 01/26/22 Right 02/02/2022 Right 02/09/22 Right    02/16/22 Right  AROM 02/16/22 03/23/2022 03/29/2022 04/27/2022  Shoulder flexion 90 PROM 170 AROM 95 PROM 110 PROM at table  95 PROM supine 100 deg PROM 110 AAROM supine 117 flexion AAROM/130 scaption AAROM 115 PROM 155 deg PROM 155 deg AROM 130 deg AROM 150 deg  Shoulder extension               Shoulder abduction 95 PROM 170 AROM 60 Per protocol   60 deg PROM 75 PROM 90 PROM Scaption 105     Shoulder adduction               Shoulder internal rotation 67 PROM 70 AROM      Reaches right upper gluteal   AROM 70 deg  Shoulder external rotation 46 PROM 90 AROM 20 PROM 12 PROM at neutral 35-40 deg @ 45 deg scapular plane PROM 30 degrees @ 45 degrees PROM 35 @ 45 degrees AAROM Reaches occiput     Elbow flexion       WFL        Elbow extension       WFL        (Blank rows = not tested)   UPPER EXTREMITY MMT:   MMT Right 04/27/2022  Shoulder flexion  4  Shoulder extension 4  Shoulder abduction  4  Shoulder internal rotation  5  Shoulder external rotation  5  Middle trapezius  4  Lower trapezius  4   PALPATION:  TTP over incision over anterior right shoulder and biceps              TODAY'S TREATMENT:  [facility] Adult PT Treatment:                                                DATE: 04/27/2022 Therapeutic Exercise: UBE L1 x 4 min (2 fwd/bwd) while taking subjective Low row machine 45# 2 x 12 Lat pull down machine 45# 2 x 10 Push-up from mat table x 8, x 5 ER/IR with FM 7# 2 x 15 each (right) Standing scaption to 90 deg 3# 2 x 10 Prone extension 3# 2 x 10 (right) Prone horizontal abduction 3# 2 x 10 (right) Prone ER 3# 2 x 10 (right) Manual Therapy: MWM shoulder end range flexion with hands on wall and forward bow x 10   [facility] Adult PT Treatment:                                                DATE: 04/21/2022 Therapeutic Exercise: UBE L1 x 4 min (2 fwd/bwd) while taking subjective Supine flexion with 5# 2 x 10 Inclined flexion 3# x 10, 5# x 10 Standing scaption to 90 deg 3# 2 x  10 Shouder ER/IR  with green 2 x 15 Alternating shoulder taps in plank position at counter 2 x 10 Low row machine 45# 2 x 15 Lat pull down machine 45# 2 x 10 Standing banded taps at wall with red band around wrists 2 x 5 each Manual Therapy: Supine GHJ mobs primarily in posterior and inferior directions at various ranges of shoulder elevation PROM right shoulder all directions as tolerated Scap pinned shoulder flexion and cross body stretch  [facility] Adult PT Treatment:                                                DATE: 04/15/2022 Therapeutic Exercise: UBE L1 x 4 min (2 fwd/bwd) while taking subjective Supine flexion with 5# 2 x 10 Supine serratus punch 2 x 10 Shouder ER/IR with green 2 x 15 Row with blue 2 x 15 Extension with green 2 x 10 Manual Therapy: Skilled palpation and monitoring of muscle tension while performing TPDN treatment Supine GHJ mobs primarily in posterior and inferior directions at various ranges of shoulder elevation PROM right shoulder all directions as tolerated Scap pinned shoulder flexion and cross body stretch Trigger Point Dry Needling Treatment: Pre-treatment instruction: Patient instructed on dry needling rationale, procedures, and possible side effects including pain during treatment (achy,cramping feeling), bruising, drop of blood, lightheadedness, nausea, sweating. Patient Consent Given: Yes Education handout provided: No Muscles treated: Right infraspinatus, right teres, posterior deltoid  Needle size and number: .30x24m x 6 Electrical stimulation performed: No Parameters: N/A Treatment response/outcome: Twitch response elicited and Palpable decrease in muscle tension Post-treatment instructions: Patient instructed to expect possible mild to moderate muscle soreness later today and/or tomorrow. Patient instructed in methods to reduce muscle soreness and to continue prescribed HEP. If patient was dry needled over the lung field, patient was instructed on  signs and symptoms of pneumothorax and, however unlikely, to see immediate medical attention should they occur. Patient was also educated on signs and symptoms of infection and to seek medical attention should they occur. Patient verbalized understanding of these instructions and education.    PATIENT EDUCATION: Education details: POC update, HEP Person educated: Patient Education method: Explanation, Demonstration, Tactile cues, Verbal cues Education comprehension: verbalized understanding   HOME EXERCISE PROGRAM: Access Code: [email or web address]   ASSESSMENT: CLINICAL IMPRESSION: Patient tolerated therapy well with no adverse effects. Overall he is progressing well with therapy and demonstrating improved range of motion and strength of the right shoulder. He is tolerating increased resistance and loads placed through the right arm. He does report increased pain at end ranges of motion and fatigue with exercises. He does lack full range of motion and strength compared to the left shoulder so patient would benefit from continued skilled PT to reduce pain and progress his shoulder mobility and strength in order to reduce pain and maximize functional mobility, so will extend PT POC for 6 more weeks at frequency of every other week.     OBJECTIVE IMPAIRMENTS decreased ROM, decreased strength, impaired UE functional use, and pain.    ACTIVITY LIMITATIONS cleaning, meal prep, occupation, and yard work.    PERSONAL FACTORS Time since onset of injury/illness/exacerbation and 1 comorbidity: hyperlipidemia  are also affecting patient's functional outcome.      GOALS: SHORT TERM GOALS: Target date: 01/17/2022   Patient will be independent with HEP for PT progression. Baseline: initial HEP addressed Status  01/17/22: independent with initial HEP Goal status: MET    LONG TERM GOALS: Target date: 04/25/2022   Patient will score 56 on FOTO. Baseline: 45 02/28/2022: not assessed 04/15/2022: 74% Goal status:  MET   2.  Patient will demonstrate >/= 170 degrees right shoulder flexion and abduction AROM to improve ability to reach for items overhead in kitchen cabinet.  Baseline: unable to perform 02/28/2022: approx 150 deg PROM 04/27/2022: AROM 150 deg Goal status: PARTIALLY MET   3.  Patient will demonstrate 90 degrees right shoulder ER AROM to improve ability to reach [facility] without limitations. Baseline: unable to perform 02/28/2022: approx 70 deg PROM 04/27/2022: AROM 70 deg Goal status: PARTIALLY MET   4.  Patient will demonstrate improved erect posture to assist with reaching [place] without limitations.  Baseline: rounded shoulders 02/28/2022: rounded shoulders  04/27/2022: patient able to demonstrate proper posture Goal status: MET   5.  Patient will demonstrate >/= 4+/5 right shoulder MMT to perform ADLs at home.  Baseline: unable to assess due to surgical protocol 02/28/2022: not assessed per protocol 04/27/2022: grossly 4/5 MMT Goal status: MODIFIED    PLAN: PT FREQUENCY: Biweekly   PT DURATION: 6 weeks   PLANNED INTERVENTIONS: Therapeutic exercises, Therapeutic activity, Neuromuscular re-education, Patient/Family education, Joint mobilization, Dry Needling, Electrical stimulation, Cryotherapy, Moist heat, scar mobilization, Taping, and Manual therapy   PLAN FOR NEXT SESSION: Review HEP and progress PRN, continue manual and PROM to improve shoulder mobility, progress strengthening and weight bearing through right arm   Hilda Blades, PT, DPT, LAT, ATC 04/27/22  1:10 PM Phone: [phone] Fax: [phone]

## 2022-04-27 ENCOUNTER — Ambulatory Visit: Payer: 59 | Admitting: Physical Therapy

## 2022-04-27 ENCOUNTER — Encounter: Payer: Self-pay | Admitting: Physical Therapy

## 2022-04-27 ENCOUNTER — Other Ambulatory Visit: Payer: Self-pay

## 2022-04-27 DIAGNOSIS — R293 Abnormal posture: Secondary | ICD-10-CM

## 2022-04-27 DIAGNOSIS — M25511 Pain in right shoulder: Secondary | ICD-10-CM

## 2022-04-27 DIAGNOSIS — M6281 Muscle weakness (generalized): Secondary | ICD-10-CM | POA: Diagnosis not present

## 2022-04-27 NOTE — Patient Instructions (Signed)
Schedule every other week for 2 visits with Megan Salon

## 2022-05-12 ENCOUNTER — Encounter: Payer: Self-pay | Admitting: Physical Therapy

## 2022-05-12 ENCOUNTER — Ambulatory Visit: Payer: 59 | Admitting: Physical Therapy

## 2022-05-12 ENCOUNTER — Other Ambulatory Visit: Payer: Self-pay

## 2022-05-12 DIAGNOSIS — R293 Abnormal posture: Secondary | ICD-10-CM

## 2022-05-12 DIAGNOSIS — M25511 Pain in right shoulder: Secondary | ICD-10-CM

## 2022-05-12 DIAGNOSIS — M6281 Muscle weakness (generalized): Secondary | ICD-10-CM

## 2022-05-12 NOTE — Therapy (Signed)
OUTPATIENT PHYSICAL THERAPY TREATMENT NOTE    Patient Name: Larry Cruz MRN: [id number] DOB:Jul 15, 1971, 51 y.o., male Today's Date: 05/12/2022  PCP: Rita Ohara, MD  REFERRING PROVIDER: Hiram Gash, MD     PT End of Session - 05/12/22 [phone]     Visit Number 22    Number of Visits 25    Date for PT Re-Evaluation 06/08/22    Authorization Type [employer]    Progress Note Due on Visit 20    PT Start Time 1000    PT Stop Time 1040    PT Time Calculation (min) 40 min    Activity Tolerance Patient tolerated treatment well    Behavior During Therapy [facility] for tasks assessed/performed                            Past Medical History:  Diagnosis Date   Aseptic meningitis    Colorblind    Dyslipidemia    Headache    Herpes genitalis in men    Hx of elbow surgery 09/02/2019   right elbow percutaneous ECRB release/lateral elbow percutaneous release-Dr.Gramig   Hyperlipidemia    Lateral epicondylitis of right elbow 03/2018   s/p injection by Dr. Micheline Chapman; MRI 10/2018, f/b eval by Dr. Amedeo Plenty   Low back pain    Meningitis 2019   negative cultures   Torn tendon    R elbow   Past Surgical History:  Procedure Laterality Date   CERVICAL DISC SURGERY  2009   JONES, MD; fusion   ELBOW SURGERY  09/02/2019   right elbow percutaneous ECRB release/lateral elbow percutaneous release-Dr. Poston Right    percutaneous--done by Dr. Amedeo Plenty in either 08/2019 or 09/2019   LUMBAR DISC SURGERY  2000   CARTER, MD; L4-L5 (right)   LUMBAR DISC SURGERY  11/2013   L4-5, Dr. Ronnald Ramp (left)   SHOULDER ARTHROSCOPY Right 12/30/2021   Procedure: ARTHROSCOPY SHOULDER/DEBRIDEMENT;  Surgeon: Hiram Gash, MD;  Location: [place];  Service: Orthopedics;  Laterality: Right;   SHOULDER ARTHROSCOPY WITH DISTAL CLAVICLE RESECTION Right 12/30/2021   Procedure: SHOULDER ARTHROSCOPY WITH DISTAL CLAVICLE EXCISION;  Surgeon: Hiram Gash, MD;   Location: [place];  Service: Orthopedics;  Laterality: Right;   SHOULDER ARTHROSCOPY WITH SUBACROMIAL DECOMPRESSION, ROTATOR CUFF REPAIR AND BICEP TENDON REPAIR Right 12/30/2021   Procedure: SHOULDER ARTHROSCOPY WITH SUBACROMIAL DECOMPRESSION, ROTATOR CUFF REPAIR AND BICEP TENDON REPAIR;  Surgeon: Hiram Gash, MD;  Location: [place];  Service: Orthopedics;  Laterality: Right;   VASECTOMY  11/09   Patient Active Problem List   Diagnosis Date Noted   Mixed hyperlipidemia 12/21/2020   Intractable chronic migraine without aura and without status migrainosus 10/14/2018   Intractable headache 07/17/2018   Aseptic meningitis 04/23/2018   Left knee pain 12/08/2016   Plantar fasciitis of left foot 12/29/2015   Cavus deformity of foot 12/29/2015   Pure hypercholesterolemia 06/23/2011    REFERRING PROVIDER: Hiram Gash, MD   REFERRING DIAG: S/P right shoulder arthroscopy with subacromial decompression, distal clavicle excision, biceps  THERAPY DIAG:  Right shoulder pain, unspecified chronicity  Muscle weakness (generalized)  Abnormal posture  PERTINENT HISTORY: S/P right shoulder arthroscopy rotator cuff repiar with subacromial decompression, distal clavicle excision, biceps on 12/30/21  PRECAUTIONS: see protocol   SUBJECTIVE: Patient reports he has been sore, especially in the pec region over the past few weeks. He notes  continued pain at end ranges and recently has been having trouble getting comfortable at night.  PAIN:  Are you having pain? Yes:  NPRS scale: 2/10  Pain location: Right shoulder Pain description: Sharp, tight Relieving factors: Ice, medication Aggravating Factors: Shoulder movement  PATIENT GOALS "Pain free and full use of arm."   OBJECTIVE: (objective measures completed at initial evaluation unless otherwise dated) PATIENT SURVEYS:  FOTO 45, predicted 56  04/15/2022: 74%   POSTURE: Rounded shoulders    UPPER  EXTREMITY ROM:    ROM Right 01/03/2022 Left 01/03/2022 Right  01/17/22 Right 01/26/22 Right 02/02/2022 Right 02/09/22 Right   02/16/22 Right  AROM 02/16/22 03/23/2022 03/29/2022 04/27/2022  Shoulder flexion 90 PROM 170 AROM 95 PROM 110 PROM at table  95 PROM supine 100 deg PROM 110 AAROM supine 117 flexion AAROM/130 scaption AAROM 115 PROM 155 deg PROM 155 deg AROM 130 deg AROM 150 deg  Shoulder extension               Shoulder abduction 95 PROM 170 AROM 60 Per protocol   60 deg PROM 75 PROM 90 PROM Scaption 105     Shoulder adduction               Shoulder internal rotation 67 PROM 70 AROM      Reaches right upper gluteal   AROM 70 deg  Shoulder external rotation 46 PROM 90 AROM 20 PROM 12 PROM at neutral 35-40 deg @ 45 deg scapular plane PROM 30 degrees @ 45 degrees PROM 35 @ 45 degrees AAROM Reaches occiput     Elbow flexion       WFL        Elbow extension       WFL        (Blank rows = not tested)   UPPER EXTREMITY MMT:   MMT Right 04/27/2022  Shoulder flexion  4  Shoulder extension 4  Shoulder abduction  4  Shoulder internal rotation  5  Shoulder external rotation  5  Middle trapezius  4  Lower trapezius  4   PALPATION:  TTP over incision over anterior right shoulder and biceps              TODAY'S TREATMENT:  [facility] Adult PT Treatment:                                                DATE: 05/12/2022 Therapeutic Exercise: UBE L1 x 4 min (2 fwd/bwd) while taking subjective Sidelying cross body stretch 3 x 20 sec Sidelying sleeper stretch 2 x 20 sec Manual Therapy: Skilled palpation and monitoring of muscle tension while performing TPDN treatment STM / TPR for lat and subscap Supine GHJ mobs primarily in posterior and inferior directions at various ranges of shoulder elevation PROM right shoulder all directions as tolerated Scap pinned shoulder flexion and cross body stretch Trigger Point Dry Needling Treatment: Pre-treatment instruction: Patient instructed on dry needling  rationale, procedures, and possible side effects including pain during treatment (achy,cramping feeling), bruising, drop of blood, lightheadedness, nausea, sweating. Patient Consent Given: Yes Education handout provided: Previously provided Muscles treated: Right subscapularis and lat  Needle size and number: .30x9m x 5 Electrical stimulation performed: No Parameters: N/A Treatment response/outcome: Twitch response elicited and Palpable decrease in muscle tension Post-treatment instructions: Patient instructed to expect possible mild to moderate muscle soreness  later today and/or tomorrow. Patient instructed in methods to reduce muscle soreness and to continue prescribed HEP. If patient was dry needled over the lung field, patient was instructed on signs and symptoms of pneumothorax and, however unlikely, to see immediate medical attention should they occur. Patient was also educated on signs and symptoms of infection and to seek medical attention should they occur. Patient verbalized understanding of these instructions and education.   [facility] Adult PT Treatment:                                                DATE: 04/27/2022 Therapeutic Exercise: UBE L1 x 4 min (2 fwd/bwd) while taking subjective Low row machine 45# 2 x 12 Lat pull down machine 45# 2 x 10 Push-up from mat table x 8, x 5 ER/IR with FM 7# 2 x 15 each (right) Standing scaption to 90 deg 3# 2 x 10 Prone extension 3# 2 x 10 (right) Prone horizontal abduction 3# 2 x 10 (right) Prone ER 3# 2 x 10 (right) Manual Therapy: MWM shoulder end range flexion with hands on wall and forward bow x 10  [facility] Adult PT Treatment:                                                DATE: 04/21/2022 Therapeutic Exercise: UBE L1 x 4 min (2 fwd/bwd) while taking subjective Supine flexion with 5# 2 x 10 Inclined flexion 3# x 10, 5# x 10 Standing scaption to 90 deg 3# 2 x 10 Shouder ER/IR with green 2 x 15 Alternating shoulder taps in plank position at  counter 2 x 10 Low row machine 45# 2 x 15 Lat pull down machine 45# 2 x 10 Standing banded taps at wall with red band around wrists 2 x 5 each Manual Therapy: Supine GHJ mobs primarily in posterior and inferior directions at various ranges of shoulder elevation PROM right shoulder all directions as tolerated Scap pinned shoulder flexion and cross body stretch    PATIENT EDUCATION: Education details: HEP Person educated: Patient Education method: Consulting civil engineer, Demonstration, Corporate treasurer cues, Verbal cues Education comprehension: verbalized understanding   HOME EXERCISE PROGRAM: Access Code: [email or web address]    ASSESSMENT: CLINICAL IMPRESSION: Patient tolerated therapy well with no adverse effects. He demonstrates overall improved shoulder motion but continues with pain at end ranges and limitation with reach behind back. Performed TPDN for subscap and lat with good tolerance to improve pain with overhead elevation and external rotation. Majority of therapy focused on manual to improve his mobility and stretching for the posterior cuff. No strengthening performed this visit to avoid exacerbation of symptoms, and patient encouraged to focus on gentle mobility over the next week to reduce pain and improve sleeping ability. No changes to HEP. Patient would benefit from continued skilled PT to reduce pain and progress his shoulder mobility and strength in order to reduce pain and maximize functional mobility.     OBJECTIVE IMPAIRMENTS decreased ROM, decreased strength, impaired UE functional use, and pain.    ACTIVITY LIMITATIONS cleaning, meal prep, occupation, and yard work.    PERSONAL FACTORS Time since onset of injury/illness/exacerbation and 1 comorbidity: hyperlipidemia  are also affecting patient's functional outcome.      GOALS:  SHORT TERM GOALS: Target date: 01/17/2022   Patient will be independent with HEP for PT progression. Baseline: initial HEP addressed Status 01/17/22: independent with  initial HEP Goal status: MET    LONG TERM GOALS: Target date: 04/25/2022   Patient will score 56 on FOTO. Baseline: 45 02/28/2022: not assessed 04/15/2022: 74% Goal status: MET   2.  Patient will demonstrate >/= 170 degrees right shoulder flexion and abduction AROM to improve ability to reach for items overhead in kitchen cabinet.  Baseline: unable to perform 02/28/2022: approx 150 deg PROM 04/27/2022: AROM 150 deg Goal status: PARTIALLY MET   3.  Patient will demonstrate 90 degrees right shoulder ER AROM to improve ability to reach [facility] without limitations. Baseline: unable to perform 02/28/2022: approx 70 deg PROM 04/27/2022: AROM 70 deg Goal status: PARTIALLY MET   4.  Patient will demonstrate improved erect posture to assist with reaching [place] without limitations.  Baseline: rounded shoulders 02/28/2022: rounded shoulders  04/27/2022: patient able to demonstrate proper posture Goal status: MET   5.  Patient will demonstrate >/= 4+/5 right shoulder MMT to perform ADLs at home.  Baseline: unable to assess due to surgical protocol 02/28/2022: not assessed per protocol 04/27/2022: grossly 4/5 MMT Goal status: MODIFIED    PLAN: PT FREQUENCY: Biweekly   PT DURATION: 6 weeks   PLANNED INTERVENTIONS: Therapeutic exercises, Therapeutic activity, Neuromuscular re-education, Patient/Family education, Joint mobilization, Dry Needling, Electrical stimulation, Cryotherapy, Moist heat, scar mobilization, Taping, and Manual therapy   PLAN FOR NEXT SESSION: Review HEP and progress PRN, continue manual and PROM to improve shoulder mobility, progress strengthening and weight bearing through right arm   Hilda Blades, PT, DPT, LAT, ATC 05/12/22  12:45 PM Phone: [phone] Fax: [phone]

## 2022-05-25 ENCOUNTER — Encounter: Payer: Self-pay | Admitting: Internal Medicine

## 2022-05-25 NOTE — Therapy (Signed)
OUTPATIENT PHYSICAL THERAPY TREATMENT NOTE    Patient Name: Larry Cruz MRN: [id number] DOB:31-Jan-1971, 51 y.o., male Today's Date: 05/26/2022  PCP: Rita Ohara, MD  REFERRING PROVIDER: Hiram Gash, MD     PT End of Session - 05/26/22 1318     Visit Number 23    Number of Visits 25    Date for PT Re-Evaluation 06/08/22    Authorization Type [employer]    Progress Note Due on Visit 20    PT Start Time 1215    PT Stop Time 1300    PT Time Calculation (min) 45 min    Activity Tolerance Patient tolerated treatment well    Behavior During Therapy [facility] for tasks assessed/performed                             Past Medical History:  Diagnosis Date   Aseptic meningitis    Colorblind    Dyslipidemia    Headache    Herpes genitalis in men    Hx of elbow surgery 09/02/2019   right elbow percutaneous ECRB release/lateral elbow percutaneous release-Dr.Gramig   Hyperlipidemia    Lateral epicondylitis of right elbow 03/2018   s/p injection by Dr. Micheline Chapman; MRI 10/2018, f/b eval by Dr. Amedeo Plenty   Low back pain    Meningitis 2019   negative cultures   Torn tendon    R elbow   Past Surgical History:  Procedure Laterality Date   CERVICAL DISC SURGERY  2009   JONES, MD; fusion   ELBOW SURGERY  09/02/2019   right elbow percutaneous ECRB release/lateral elbow percutaneous release-Dr. [place] Right    percutaneous--done by Dr. Amedeo Plenty in either 08/2019 or 09/2019   LUMBAR DISC SURGERY  2000   CARTER, MD; L4-L5 (right)   LUMBAR DISC SURGERY  11/2013   L4-5, Dr. Ronnald Ramp (left)   SHOULDER ARTHROSCOPY Right 12/30/2021   Procedure: ARTHROSCOPY SHOULDER/DEBRIDEMENT;  Surgeon: Hiram Gash, MD;  Location: [place];  Service: Orthopedics;  Laterality: Right;   SHOULDER ARTHROSCOPY WITH DISTAL CLAVICLE RESECTION Right 12/30/2021   Procedure: SHOULDER ARTHROSCOPY WITH DISTAL CLAVICLE EXCISION;  Surgeon: Hiram Gash, MD;   Location: [place];  Service: Orthopedics;  Laterality: Right;   SHOULDER ARTHROSCOPY WITH SUBACROMIAL DECOMPRESSION, ROTATOR CUFF REPAIR AND BICEP TENDON REPAIR Right 12/30/2021   Procedure: SHOULDER ARTHROSCOPY WITH SUBACROMIAL DECOMPRESSION, ROTATOR CUFF REPAIR AND BICEP TENDON REPAIR;  Surgeon: Hiram Gash, MD;  Location: [place];  Service: Orthopedics;  Laterality: Right;   VASECTOMY  11/09   Patient Active Problem List   Diagnosis Date Noted   Mixed hyperlipidemia 12/21/2020   Intractable chronic migraine without aura and without status migrainosus 10/14/2018   Intractable headache 07/17/2018   Aseptic meningitis 04/23/2018   Left knee pain 12/08/2016   Plantar fasciitis of left foot 12/29/2015   Cavus deformity of foot 12/29/2015   Pure hypercholesterolemia 06/23/2011    REFERRING PROVIDER: Hiram Gash, MD   REFERRING DIAG: S/P right shoulder arthroscopy with subacromial decompression, distal clavicle excision, biceps  THERAPY DIAG:  Right shoulder pain, unspecified chronicity  Muscle weakness (generalized)  PERTINENT HISTORY: S/P right shoulder arthroscopy rotator cuff repiar with subacromial decompression, distal clavicle excision, biceps on 12/30/21  PRECAUTIONS: see protocol   SUBJECTIVE: Patient reports he is still having some trouble sleeping due to shoulder discomfort, and end range pain. No limitation during  a typical work day.  PAIN:  Are you having pain? Yes:  NPRS scale: 2/10  Pain location: Right shoulder Pain description: Sharp, tight Relieving factors: Ice, medication Aggravating Factors: Shoulder movement  PATIENT GOALS "Pain free and full use of arm."   OBJECTIVE: (objective measures completed at initial evaluation unless otherwise dated) PATIENT SURVEYS:  FOTO 45, predicted 56  04/15/2022: 74%   POSTURE: Rounded shoulders    UPPER EXTREMITY ROM:    ROM Right 01/03/2022 Left 01/03/2022 Right  01/17/22  Right 01/26/22 Right 02/02/2022 Right 02/09/22 Right   02/16/22 Right  AROM 02/16/22 03/23/2022 03/29/2022 04/27/2022 05/26/2022  Shoulder flexion 90 PROM 170 AROM 95 PROM 110 PROM at table  95 PROM supine 100 deg PROM 110 AAROM supine 117 flexion AAROM/130 scaption AAROM 115 PROM 155 deg PROM 155 deg AROM 130 deg AROM 150 deg   Shoulder extension                Shoulder abduction 95 PROM 170 AROM 60 Per protocol   60 deg PROM 75 PROM 90 PROM Scaption 105      Shoulder adduction                Shoulder internal rotation 67 PROM 70 AROM      Reaches right upper gluteal   AROM 70 deg Reach to L1  Shoulder external rotation 46 PROM 90 AROM 20 PROM 12 PROM at neutral 35-40 deg @ 45 deg scapular plane PROM 30 degrees @ 45 degrees PROM 35 @ 45 degrees AAROM Reaches occiput      Elbow flexion       WFL         Elbow extension       WFL         (Blank rows = not tested)   UPPER EXTREMITY MMT:   MMT Right 04/27/2022  Shoulder flexion  4  Shoulder extension 4  Shoulder abduction  4  Shoulder internal rotation  5  Shoulder external rotation  5  Middle trapezius  4  Lower trapezius  4   PALPATION:  TTP over incision over anterior right shoulder and biceps              TODAY'S TREATMENT:  [facility] Adult PT Treatment:                                                DATE: 05/26/2022 Therapeutic Exercise: UBE L1 x 4 min (2 fwd/bwd) while taking subjective Sidelying cross body stretch 3 x 20 sec Sidelying sleeper stretch 2 x 20 sec Sidelying ER with 4# 2 x 15 Supine serratus punch with 4# 2 x 10 Standing scaption back at wall with 3# 2 x 10 Single arm row with FM 13# 2 x 10 Manual Therapy: Supine GHJ mobs primarily in posterior and inferior directions at various ranges of shoulder elevation Sidelying scapulothoracic mobs all directions PROM right shoulder all directions as tolerated Scap pinned shoulder flexion and cross body stretch   [facility] Adult PT Treatment:                                                 DATE: 05/12/2022 Therapeutic Exercise: UBE L1 x 4 min (  2 fwd/bwd) while taking subjective Sidelying cross body stretch 3 x 20 sec Sidelying sleeper stretch 2 x 20 sec Manual Therapy: Skilled palpation and monitoring of muscle tension while performing TPDN treatment STM / TPR for lat and subscap Supine GHJ mobs primarily in posterior and inferior directions at various ranges of shoulder elevation PROM right shoulder all directions as tolerated Scap pinned shoulder flexion and cross body stretch Trigger Point Dry Needling Treatment: Pre-treatment instruction: Patient instructed on dry needling rationale, procedures, and possible side effects including pain during treatment (achy,cramping feeling), bruising, drop of blood, lightheadedness, nausea, sweating. Patient Consent Given: Yes Education handout provided: Previously provided Muscles treated: Right subscapularis and lat  Needle size and number: .30x48m x 5 Electrical stimulation performed: No Parameters: N/A Treatment response/outcome: Twitch response elicited and Palpable decrease in muscle tension Post-treatment instructions: Patient instructed to expect possible mild to moderate muscle soreness later today and/or tomorrow. Patient instructed in methods to reduce muscle soreness and to continue prescribed HEP. If patient was dry needled over the lung field, patient was instructed on signs and symptoms of pneumothorax and, however unlikely, to see immediate medical attention should they occur. Patient was also educated on signs and symptoms of infection and to seek medical attention should they occur. Patient verbalized understanding of these instructions and education.  [facility]                                                DATE: 04/27/2022 Therapeutic Exercise: UBE L1 x 4 min (2 fwd/bwd) while taking subjective Low row machine 45# 2 x 12 Lat pull down machine 45# 2 x 10 Push-up from mat table x 8, x 5 ER/IR  with FM 7# 2 x 15 each (right) Standing scaption to 90 deg 3# 2 x 10 Prone extension 3# 2 x 10 (right) Prone horizontal abduction 3# 2 x 10 (right) Prone ER 3# 2 x 10 (right) Manual Therapy: MWM shoulder end range flexion with hands on wall and forward bow x 10    PATIENT EDUCATION: Education details: HEP Person educated: Patient Education method: EConsulting civil engineer Demonstration, TCorporate treasurercues, Verbal cues Education comprehension: verbalized understanding   HOME EXERCISE PROGRAM: Access Code: [email or web address]   ASSESSMENT: CLINICAL IMPRESSION: Patient tolerated therapy well with no adverse effects. Therapy focused on continued progression of shoulder mobility and strengthening with good tolerance. He demonstrates improved functional reach behind his back but still limited compared to opposite side. He does continue to report some discomfort and difficulty sleeping due to right shoulder so strengthening kept at comfortable level. No changes made to HEP this visit. Patient would benefit from continued skilled PT to reduce pain and progress his shoulder mobility and strength in order to reduce pain and maximize functional mobility.     OBJECTIVE IMPAIRMENTS decreased ROM, decreased strength, impaired UE functional use, and pain.    ACTIVITY LIMITATIONS cleaning, meal prep, occupation, and yard work.    PERSONAL FACTORS Time since onset of injury/illness/exacerbation and 1 comorbidity: hyperlipidemia  are also affecting patient's functional outcome.      GOALS: SHORT TERM GOALS: Target date: 01/17/2022   Patient will be independent with HEP for PT progression. Baseline: initial HEP addressed Status 01/17/22: independent with initial HEP Goal status: MET    LONG TERM GOALS: Target date: 04/25/2022   Patient will score 56 on FOTO. Baseline:  45 02/28/2022: not assessed 04/15/2022: 74% Goal status: MET   2.  Patient will demonstrate >/= 170 degrees right shoulder flexion and abduction AROM to  improve ability to reach for items overhead in kitchen cabinet.  Baseline: unable to perform 02/28/2022: approx 150 deg PROM 04/27/2022: AROM 150 deg Goal status: PARTIALLY MET   3.  Patient will demonstrate 90 degrees right shoulder ER AROM to improve ability to reach [facility] without limitations. Baseline: unable to perform 02/28/2022: approx 70 deg PROM 04/27/2022: AROM 70 deg Goal status: PARTIALLY MET   4.  Patient will demonstrate improved erect posture to assist with reaching [place] without limitations.  Baseline: rounded shoulders 02/28/2022: rounded shoulders  04/27/2022: patient able to demonstrate proper posture Goal status: MET   5.  Patient will demonstrate >/= 4+/5 right shoulder MMT to perform ADLs at home.  Baseline: unable to assess due to surgical protocol 02/28/2022: not assessed per protocol 04/27/2022: grossly 4/5 MMT Goal status: MODIFIED    PLAN: PT FREQUENCY: Biweekly   PT DURATION: 6 weeks   PLANNED INTERVENTIONS: Therapeutic exercises, Therapeutic activity, Neuromuscular re-education, Patient/Family education, Joint mobilization, Dry Needling, Electrical stimulation, Cryotherapy, Moist heat, scar mobilization, Taping, and Manual therapy   PLAN FOR NEXT SESSION: Review HEP and progress PRN, continue manual and PROM to improve shoulder mobility, progress strengthening and weight bearing through right arm   Hilda Blades, PT, DPT, LAT, ATC 05/26/22  1:29 PM Phone: [phone] Fax: [phone]

## 2022-05-26 ENCOUNTER — Ambulatory Visit: Payer: 59 | Attending: Orthopaedic Surgery | Admitting: Physical Therapy

## 2022-05-26 ENCOUNTER — Other Ambulatory Visit: Payer: Self-pay

## 2022-05-26 ENCOUNTER — Encounter: Payer: Self-pay | Admitting: Physical Therapy

## 2022-05-26 DIAGNOSIS — M25511 Pain in right shoulder: Secondary | ICD-10-CM | POA: Insufficient documentation

## 2022-05-26 DIAGNOSIS — M6281 Muscle weakness (generalized): Secondary | ICD-10-CM | POA: Insufficient documentation

## 2022-06-06 ENCOUNTER — Encounter: Payer: Self-pay | Admitting: Gastroenterology

## 2022-06-09 ENCOUNTER — Other Ambulatory Visit: Payer: Self-pay

## 2022-06-09 ENCOUNTER — Ambulatory Visit: Payer: 59 | Admitting: Physical Therapy

## 2022-06-09 ENCOUNTER — Encounter: Payer: Self-pay | Admitting: Physical Therapy

## 2022-06-09 DIAGNOSIS — M6281 Muscle weakness (generalized): Secondary | ICD-10-CM | POA: Diagnosis not present

## 2022-06-09 DIAGNOSIS — M25511 Pain in right shoulder: Secondary | ICD-10-CM | POA: Diagnosis not present

## 2022-06-09 NOTE — Therapy (Signed)
OUTPATIENT PHYSICAL THERAPY TREATMENT NOTE  DISCHARGE    Patient Name: Larry Cruz MRN: [id number] DOB:14-Sep-1971, 51 y.o., male Today's Date: 06/09/2022  PCP: Rita Ohara, MD REFERRING PROVIDER: Hiram Gash, MD     PT End of Session - 06/09/22 1222     Visit Number 24    Number of Visits 25    Date for PT Re-Evaluation 06/09/22    Authorization Type [employer]    PT Start Time 1130    PT Stop Time 1200    PT Time Calculation (min) 30 min    Activity Tolerance Patient tolerated treatment well    Behavior During Therapy [facility] for tasks assessed/performed                              Past Medical History:  Diagnosis Date   Aseptic meningitis    Colorblind    Dyslipidemia    Headache    Herpes genitalis in men    Hx of elbow surgery 09/02/2019   right elbow percutaneous ECRB release/lateral elbow percutaneous release-Dr.Gramig   Hyperlipidemia    Lateral epicondylitis of right elbow 03/2018   s/p injection by Dr. Micheline Chapman; MRI 10/2018, f/b eval by Dr. Amedeo Plenty   Low back pain    Meningitis 2019   negative cultures   Torn tendon    R elbow   Past Surgical History:  Procedure Laterality Date   CERVICAL DISC SURGERY  2009   JONES, MD; fusion   ELBOW SURGERY  09/02/2019   right elbow percutaneous ECRB release/lateral elbow percutaneous release-Dr. [place] Right    percutaneous--done by Dr. Amedeo Plenty in either 08/2019 or 09/2019   LUMBAR DISC SURGERY  2000   CARTER, MD; L4-L5 (right)   LUMBAR DISC SURGERY  11/2013   L4-5, Dr. Ronnald Ramp (left)   SHOULDER ARTHROSCOPY Right 12/30/2021   Procedure: ARTHROSCOPY SHOULDER/DEBRIDEMENT;  Surgeon: Hiram Gash, MD;  Location: [place];  Service: Orthopedics;  Laterality: Right;   SHOULDER ARTHROSCOPY WITH DISTAL CLAVICLE RESECTION Right 12/30/2021   Procedure: SHOULDER ARTHROSCOPY WITH DISTAL CLAVICLE EXCISION;  Surgeon: Hiram Gash, MD;  Location: [place];  Service: Orthopedics;  Laterality: Right;   SHOULDER ARTHROSCOPY WITH SUBACROMIAL DECOMPRESSION, ROTATOR CUFF REPAIR AND BICEP TENDON REPAIR Right 12/30/2021   Procedure: SHOULDER ARTHROSCOPY WITH SUBACROMIAL DECOMPRESSION, ROTATOR CUFF REPAIR AND BICEP TENDON REPAIR;  Surgeon: Hiram Gash, MD;  Location: [place];  Service: Orthopedics;  Laterality: Right;   VASECTOMY  11/09   Patient Active Problem List   Diagnosis Date Noted   Mixed hyperlipidemia 12/21/2020   Intractable chronic migraine without aura and without status migrainosus 10/14/2018   Intractable headache 07/17/2018   Aseptic meningitis 04/23/2018   Left knee pain 12/08/2016   Plantar fasciitis of left foot 12/29/2015   Cavus deformity of foot 12/29/2015   Pure hypercholesterolemia 06/23/2011    REFERRING PROVIDER: Hiram Gash, MD   REFERRING DIAG: S/P right shoulder arthroscopy with subacromial decompression, distal clavicle excision, biceps  THERAPY DIAG:  Right shoulder pain, unspecified chronicity  Muscle weakness (generalized)  PERTINENT HISTORY: S/P right shoulder arthroscopy rotator cuff repiar with subacromial decompression, distal clavicle excision, biceps on 12/30/21  PRECAUTIONS: see protocol   SUBJECTIVE: Patient reports he is doing well. Majority of discomfort is at night and makes it difficulty to get comfortable at night. He does not feel limited with  any housework or daily activities and feels his shoulder continues to improve regarding mobility and strength.  PAIN:  Are you having pain? Yes:  NPRS scale: 0/10  Pain location: Right shoulder Pain description: Sharp, tight Relieving factors: Ice, medication Aggravating Factors: Nocturnal pain  PATIENT GOALS "Pain free and full use of arm."   OBJECTIVE: (objective measures completed at initial evaluation unless otherwise dated) PATIENT SURVEYS:  FOTO 45, predicted 56  04/15/2022: 74%   POSTURE: Rounded  shoulders    UPPER EXTREMITY ROM:    ROM Right 01/03/2022 Left 01/03/2022 Right  01/17/22 Right 01/26/22 Right 02/02/2022 Right 02/09/22 Right   02/16/22 Right  AROM 02/16/22 03/23/2022 03/29/2022 04/27/2022 05/26/2022 06/09/2022  Shoulder flexion 90 PROM 170 AROM 95 PROM 110 PROM at table  95 PROM supine 100 deg PROM 110 AAROM supine 117 flexion AAROM/130 scaption AAROM 115 PROM 155 deg PROM 155 deg AROM 130 deg AROM 150 deg  160  Shoulder extension                 Shoulder abduction 95 PROM 170 AROM 60 Per protocol   60 deg PROM 75 PROM 90 PROM Scaption 105       Shoulder adduction                 Shoulder internal rotation 67 PROM 70 AROM      Reaches right upper gluteal   AROM 70 deg Reach to L1 L1  Shoulder external rotation 46 PROM 90 AROM 20 PROM 12 PROM at neutral 35-40 deg @ 45 deg scapular plane PROM 30 degrees @ 45 degrees PROM 35 @ 45 degrees AAROM Reaches occiput     70 (T2)  Elbow flexion       WFL          Elbow extension       WFL          (Blank rows = not tested)   UPPER EXTREMITY MMT:   MMT Right 04/27/2022 Right 06/09/2022  Shoulder flexion  4 4+  Shoulder extension 4 4+  Shoulder abduction  4 4+  Shoulder internal rotation  5 5  Shoulder external rotation  5 5  Middle trapezius  4   Lower trapezius  4    PALPATION:  TTP over incision over anterior right shoulder and biceps              TODAY'S TREATMENT:  [facility] Adult PT Treatment:                                                DATE: 06/09/2022 Therapeutic Exercise: UBE L1 x 4 min (2 fwd/bwd) while taking subjective Scaption with 5# x 15 Overhead shoulder press with 5# x 20 Row with black x 15 Extension with black x 15 ER with black x 15 Manual Therapy: PROM right shoulder all directions as tolerated   [facility] Adult PT Treatment:                                                DATE: 05/26/2022 Therapeutic Exercise: UBE L1 x 4 min (2 fwd/bwd) while taking subjective Sidelying cross body stretch 3 x 20  sec Sidelying sleeper stretch 2 x 20  sec Sidelying ER with 4# 2 x 15 Supine serratus punch with 4# 2 x 10 Standing scaption back at wall with 3# 2 x 10 Single arm row with FM 13# 2 x 10 Manual Therapy: PROM right shoulder all directions as tolerated Scap pinned shoulder flexion and cross body stretch  [facility] Adult PT Treatment:                                                DATE: 05/12/2022 Therapeutic Exercise: UBE L1 x 4 min (2 fwd/bwd) while taking subjective Sidelying cross body stretch 3 x 20 sec Sidelying sleeper stretch 2 x 20 sec Manual Therapy: Skilled palpation and monitoring of muscle tension while performing TPDN treatment STM / TPR for lat and subscap Supine GHJ mobs primarily in posterior and inferior directions at various ranges of shoulder elevation PROM right shoulder all directions as tolerated Scap pinned shoulder flexion and cross body stretch Trigger Point Dry Needling Treatment: Pre-treatment instruction: Patient instructed on dry needling rationale, procedures, and possible side effects including pain during treatment (achy,cramping feeling), bruising, drop of blood, lightheadedness, nausea, sweating. Patient Consent Given: Yes Education handout provided: Previously provided Muscles treated: Right subscapularis and lat  Needle size and number: .30x62m x 5 Electrical stimulation performed: No Parameters: N/A Treatment response/outcome: Twitch response elicited and Palpable decrease in muscle tension Post-treatment instructions: Patient instructed to expect possible mild to moderate muscle soreness later today and/or tomorrow. Patient instructed in methods to reduce muscle soreness and to continue prescribed HEP. If patient was dry needled over the lung field, patient was instructed on signs and symptoms of pneumothorax and, however unlikely, to see immediate medical attention should they occur. Patient was also educated on signs and symptoms of infection and to seek  medical attention should they occur. Patient verbalized understanding of these instructions and education.  [facility]                                                DATE: 04/27/2022 Therapeutic Exercise: UBE L1 x 4 min (2 fwd/bwd) while taking subjective Low row machine 45# 2 x 12 Lat pull down machine 45# 2 x 10 Push-up from mat table x 8, x 5 ER/IR with FM 7# 2 x 15 each (right) Standing scaption to 90 deg 3# 2 x 10 Prone extension 3# 2 x 10 (right) Prone horizontal abduction 3# 2 x 10 (right) Prone ER 3# 2 x 10 (right) Manual Therapy: MWM shoulder end range flexion with hands on wall and forward bow x 10    PATIENT EDUCATION: Education details: POC, discharge, HEP Person educated: Patient Education method: Explanation Education comprehension: Verbalized understanding   HOME EXERCISE PROGRAM: Access Code: [email or web address]   ASSESSMENT: CLINICAL IMPRESSION: Patient tolerated therapy well with no adverse effects. He demonstrates improved shoulder motion and strength, but does continue to exhibit slight limitation in end range motions and strength deficit compared to the left side. He is independent with his HEP and will be able to continue progressing his shoulder mobility and strength independently at home. Patient will be formally discharged from PT to independent home exercise program.     OBJECTIVE IMPAIRMENTS decreased ROM, decreased strength, impaired UE functional use, and  pain.    ACTIVITY LIMITATIONS cleaning, meal prep, occupation, and yard work.    PERSONAL FACTORS Time since onset of injury/illness/exacerbation and 1 comorbidity: hyperlipidemia  are also affecting patient's functional outcome.      GOALS: SHORT TERM GOALS: Target date: 01/17/2022   Patient will be independent with HEP for PT progression. Baseline: initial HEP addressed Status 01/17/22: independent with initial HEP Goal status: MET    LONG TERM GOALS: Target date: 04/25/2022   Patient will  score 56 on FOTO. Baseline: 45 02/28/2022: not assessed 04/15/2022: 74% Goal status: MET   2.  Patient will demonstrate >/= 170 degrees right shoulder flexion and abduction AROM to improve ability to reach for items overhead in kitchen cabinet.  Baseline: unable to perform 02/28/2022: approx 150 deg PROM 04/27/2022: AROM 150 deg 06/09/2022: 160 deg Goal status: PARTIALLY MET   3.  Patient will demonstrate 90 degrees right shoulder ER AROM to improve ability to reach [facility] without limitations. Baseline: unable to perform 02/28/2022: approx 70 deg PROM 04/27/2022: AROM 70 deg 06/09/2022: 70 deg Goal status: PARTIALLY MET   4.  Patient will demonstrate improved erect posture to assist with reaching [place] without limitations.  Baseline: rounded shoulders 02/28/2022: rounded shoulders  04/27/2022: patient able to demonstrate proper posture Goal status: MET   5.  Patient will demonstrate >/= 4+/5 right shoulder MMT to perform ADLs at home.  Baseline: unable to assess due to surgical protocol 02/28/2022: not assessed per protocol 04/27/2022: grossly 4/5 MMT 06/09/2022: grossly 4+/5 MMT Goal status: MET    PLAN: PT FREQUENCY: -   PT DURATION: -   PLANNED INTERVENTIONS: Therapeutic exercises, Therapeutic activity, Neuromuscular re-education, Patient/Family education, Joint mobilization, Dry Needling, Electrical stimulation, Cryotherapy, Moist heat, scar mobilization, Taping, and Manual therapy   PLAN FOR NEXT SESSION: Discharged   Hilda Blades, PT, DPT, LAT, ATC 06/09/22  12:49 PM Phone: [phone] Fax: [phone]    PHYSICAL THERAPY DISCHARGE SUMMARY  Visits from Start of Care: 24  Current functional level related to goals / functional outcomes: See above   Remaining deficits: See above   Education / Equipment: HEP   Patient agrees to discharge. Patient goals were partially met. Patient is being discharged due to being pleased with the current functional level.

## 2022-07-04 NOTE — Progress Notes (Unsigned)
No chief complaint on file.   Larry Cruz is a 51 y.o. male who presents for a complete physical.   Chart reviewed for visits since last visit with me last year.  He had a reaction to [facility] booster given at physical, with fever, chills, HA. He had poison ivy in July, treated with steroids. He had R shoulder surgery by Dr. Griffin Basil 12/30/21, and completed PT in September.  Hyperlipidemia follow-up:  He reports compliance with atorvastatin 65m daily, and is trying to follow a lowfat, low cholesterol diet.  He denies any side effects.  Dose of lipitor was increased from 40 to 840min April 2022. He has had borderline LDL, chol ratios, TG, and is due for recheck. Fatty liver changes were noted on abdominal ultrasound in June 2021, with h/o elevated LFT's (which have resolved.)  His current diet includes red meat to 2-3x/week, +cheese (sandwich, snacks), 2-3 eggs/week, occasional ice cream; Italian/vinaigrette dressings, no mayo. Kids activities are ramped up, and meals are on-the-go about 4 nights/week.  Lab Results  Component Value Date   CHOL 209 (H) 11/22/2021   HDL 48 11/22/2021   LDLCALC 136 (H) 11/22/2021   TRIG 141 11/22/2021   CHOLHDL 4.4 11/22/2021   Lab Results  Component Value Date   ALT 31 06/28/2021   AST 32 06/28/2021   ALKPHOS 109 06/28/2021   BILITOT 1.0 06/28/2021    GERD:  He reports having some intermittent reflux symptoms. Uses an OTC Nexium prn (every few weeks).  Unable to identify triggers, has some spicy foods.  He still occasionally has some RUQ pain, often if he skips a meal.  It resolves on its own, unsure if eating resolves it.  Insomnia--intermittent. There may be weeks he may need Ambien 1-2 times, occasionally up to 4, but some weeks without.  Averages about 1-2/week.  Sometimes only needs 1/2 tablet. Last filled #30 on 12/13/21, prior fill was 04/19/2021.  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  Last refilled Valtrex #30 11/2021. As long as  he gets adequate sleep, he doesn't have outbreaks.  H/o Right lateral epicondylitis, s/p percutaneous release by Dr. GrAmedeo Plentybut has persistent discomfort, and is still unable to play tennis. He can't sleep with his arm bent, or the RUE is painful. He has gotten better about sleeping with his arm straight (doesn't need to sleep with a brace).   He has had chronic headaches since meningitis in 2019. Topamax wasn't helpful, ultimately resolved with Botox. No longer requires any treatments (had total of about 4-5 treatments).  He still has some mild infrequent headaches, usually related to dehydration or not getting enough sleep.  Severe headache with fever after Moderna booster 06/2021.  Immunization History  Administered Date(s) Administered   Influenza Split 07/03/2012, 07/03/2014, 06/20/2019   Influenza-Unspecified 06/19/2018, 06/14/2021   Moderna Covid-19 Vaccine Bivalent Booster 1869yr up 06/28/2021   Tdap 10/22/2012   Zoster Recombinat (Shingrix) 11/22/2021   Has had 3 COVID vaccines, booster in 08/2020. Gets flu shots yearly through work ([employer]) Last colonoscopy: 08/2019 with Dr. StaFuller PlanHad 7 and 28m24mlyps, which were adenomatous, along with internal hemorrhoids. Repeat due in 08/2022.  Last PSA:  Lab Results  Component Value Date   PSA1 1.2 06/28/2021   PSA1 1.2 06/15/2020  Dentist: twice yearly   Ophtho: yearly Exercise:  walks 2-3 days/week, 20-30 minutes. No other exercise currently.   Vitamin D-OH normal in 04/2015 (40)  PMH, PSH, SH and FH were reviewed  and updated    ROS: The patient denies anorexia, fever, vision loss, decreased hearing, ear pain, hoarseness, chest pain, dizziness, syncope, dyspnea on exertion, cough, swelling, nausea, vomiting, diarrhea, melena, hematochezia, hematuria, incontinence, erectile dysfunction, nocturia, weakened urine stream, dysuria, genital lesions, numbness, tingling, weakness, tremor, suspicious skin lesions, depression,  anxiety, abnormal bleeding/bruising, or enlarged lymph nodes.   Herpes flares are infrequent. Persistent R elbow pain per HPI. Rare mild headaches per HPI.  Denies vertigo, dysequilibrium or other neuro symptoms. Insomnia--per HPI, infrequent Goes to the dermatologist yearly.  No current skin concerns. Heartburn per HPI, less frequent; intermittent RUQ pain per HPI. R shoulder pain ?resolved    PHYSICAL EXAM:  There were no vitals taken for this visit.  Wt Readings from Last 3 Encounters:  12/30/21 232 lb 2.3 oz (105.3 kg)  06/29/21 230 lb (104.3 kg)  06/28/21 230 lb 9.6 oz (104.6 kg)    General Appearance  Alert, cooperative, no distress, appears stated age    Head:   Normocephalic, without obvious abnormality, atraumatic    Eyes:   PERRL, conjunctiva/corneas clear, EOM's intact, fundi benign    Ears:   Normal TMs and EACs bilaterally  Nose:   No drainage or sinus tenderness  Throat:   Normal mucosa, no lesions  Neck:   Supple, no lymphadenopathy; thyroid: no enlargement/ tenderness/nodules; no carotid bruit or JVD    Back:   Spine nontender, no curvature, ROM normal, no CVA tenderness    Lungs:   Clear to auscultation bilaterally without wheezes, rales or ronchi; respirations unlabored   Chest Wall:   No tenderness or deformity    Heart:   Regular rate and rhythm, S1 and S2 normal, no murmur, rub or gallop    Breast Exam:  No chest wall tenderness, masses or gynecomastia   Abdomen:   Soft, non-tender, nondistended, normoactive bowel sounds,  no masses, no hepatosplenomegaly. +central abdominal obesity  Genitalia:   Normal male external genitalia without lesions. Testicles without masses.  No inguinal hernias.    Rectal:   Normal sphincter tone, no masses or tenderness; guaiac negative stool. Prostate smooth, no nodules, not enlarged.    Extremities:   No clubbing, cyanosis or edema  Pulses:   2+ and symmetric all extremities    Skin:   Skin color, texture, turgor normal, no  lesions.  Lymph nodes:   Cervical, supraclavicular, and inguinal nodes normal    Neurologic:   Alert and oriented. Normal strength, sensation and gait; reflexes 2+ and symmetric throughout                Psych:  Normal mood, affect, hygiene and grooming    ASSESSMENT/PLAN:  Did he get 2nd Shingrix? We are missing his first 3 COVID vaccines. Enter date of flu shot  Tdap due Feb. Depending on when he had flu shot, consider today. ?if he wants COVID booster  Cbc, c-met, PSA, lipids  ?needs lipitor RF? AWAIT LABS--?change statin  Discussed PSA screening (risks/benefits), recommended at least 30 minutes of aerobic activity at least 5 days/week, weight-bearing exercise 2x/week; proper sunscreen use reviewed; healthy diet and alcohol recommendations (less than or equal to 2 drinks/day, even less if elevated LFT's pesist) reviewed; regular seatbelt use; changing batteries in smoke detectors. Immunization recommendations discussed--continue yearly flu shots through work. Counseled re: risks/SE of Shingrix, and recommended he return for [place] when convenient. Discussed COVID boosters, encouraged him to set up [place] to get bivalent booster.  Colonoscopy recommendations reviewed, UTD; due again 08/2022.  CPE 1 year, sooner prn based on lab results

## 2022-07-04 NOTE — Patient Instructions (Incomplete)
  HEALTH MAINTENANCE RECOMMENDATIONS:  It is recommended that you get at least 30 minutes of aerobic exercise at least 5 days/week (for weight loss, you may need as much as 60-90 minutes). This can be any activity that gets your heart rate up. This can be divided in 10-15 minute intervals if needed, but try and build up your endurance at least once a week.  Weight bearing exercise is also recommended twice weekly.  Eat a healthy diet with lots of vegetables, fruits and fiber.  "Colorful" foods have a lot of vitamins (ie green vegetables, tomatoes, red peppers, etc).  Limit sweet tea, regular sodas and alcoholic beverages, all of which has a lot of calories and sugar.  Up to 2 alcoholic drinks daily may be beneficial for men (unless trying to lose weight, watch sugars).  Drink a lot of water.  Sunscreen of at least SPF 30 should be used on all sun-exposed parts of the skin when outside between the hours of 10 am and 4 pm (not just when at beach or pool, but even with exercise, golf, tennis, and yard work!)  Use a sunscreen that says "broad spectrum" so it covers both UVA and UVB rays, and make sure to reapply every 1-2 hours.  Remember to change the batteries in your smoke detectors when changing your clock times in the spring and fall.  Carbon monoxide detectors are recommended for your home.  Use your seat belt every time you are in a car, and please drive safely and not be distracted with cell phones and texting while driving.  I recommend getting your 2nd shingles vaccine 2 weeks after your flu and COVID shots. Your Tdap isn't due until February--your choice to come for a nurse visit at your convenience (we can always give it at next year's physical if you forget).  Let us know the date of your upcoming flu and COVID vaccines, and send [place] a copy of your card for the others so we can get those entered into your chart. I recommend you go back to [employer] (if that's what you had originally),  remembering your reaction to the [place] booster last year.  Your colonoscopy is due in December.  If you want it done before the end of the year, I'd contact [facility] now.

## 2022-07-06 ENCOUNTER — Encounter: Payer: Self-pay | Admitting: *Deleted

## 2022-07-06 ENCOUNTER — Ambulatory Visit (INDEPENDENT_AMBULATORY_CARE_PROVIDER_SITE_OTHER): Payer: 59 | Admitting: Family Medicine

## 2022-07-06 ENCOUNTER — Encounter: Payer: Self-pay | Admitting: Family Medicine

## 2022-07-06 VITALS — BP 106/80 | HR 72 | Ht 73.0 in | Wt 238.0 lb

## 2022-07-06 DIAGNOSIS — Z125 Encounter for screening for malignant neoplasm of prostate: Secondary | ICD-10-CM

## 2022-07-06 DIAGNOSIS — K219 Gastro-esophageal reflux disease without esophagitis: Secondary | ICD-10-CM

## 2022-07-06 DIAGNOSIS — G4700 Insomnia, unspecified: Secondary | ICD-10-CM | POA: Insufficient documentation

## 2022-07-06 DIAGNOSIS — Z6831 Body mass index (BMI) 31.0-31.9, adult: Secondary | ICD-10-CM | POA: Diagnosis not present

## 2022-07-06 DIAGNOSIS — K429 Umbilical hernia without obstruction or gangrene: Secondary | ICD-10-CM | POA: Diagnosis not present

## 2022-07-06 DIAGNOSIS — E782 Mixed hyperlipidemia: Secondary | ICD-10-CM | POA: Diagnosis not present

## 2022-07-06 DIAGNOSIS — Z5181 Encounter for therapeutic drug level monitoring: Secondary | ICD-10-CM

## 2022-07-06 DIAGNOSIS — R1011 Right upper quadrant pain: Secondary | ICD-10-CM | POA: Diagnosis not present

## 2022-07-06 DIAGNOSIS — Z0000 Encounter for general adult medical examination without abnormal findings: Secondary | ICD-10-CM | POA: Diagnosis not present

## 2022-07-07 ENCOUNTER — Other Ambulatory Visit (HOSPITAL_COMMUNITY): Payer: Self-pay

## 2022-07-07 LAB — COMPREHENSIVE METABOLIC PANEL
ALT: 44 IU/L (ref 0–44)
AST: 43 IU/L — ABNORMAL HIGH (ref 0–40)
Albumin/Globulin Ratio: 2.4 — ABNORMAL HIGH (ref 1.2–2.2)
Albumin: 5.5 g/dL — ABNORMAL HIGH (ref 3.8–4.9)
Alkaline Phosphatase: 110 IU/L (ref 44–121)
BUN/Creatinine Ratio: 13 (ref 9–20)
BUN: 14 mg/dL (ref 6–24)
Bilirubin Total: 1.1 mg/dL (ref 0.0–1.2)
CO2: 24 mmol/L (ref 20–29)
Calcium: 10.1 mg/dL (ref 8.7–10.2)
Chloride: 97 mmol/L (ref 96–106)
Creatinine, Ser: 1.1 mg/dL (ref 0.76–1.27)
Globulin, Total: 2.3 g/dL (ref 1.5–4.5)
Glucose: 93 mg/dL (ref 70–99)
Potassium: 4.5 mmol/L (ref 3.5–5.2)
Sodium: 139 mmol/L (ref 134–144)
Total Protein: 7.8 g/dL (ref 6.0–8.5)
eGFR: 81 mL/min/{1.73_m2} (ref >59)

## 2022-07-07 LAB — CBC WITH DIFFERENTIAL/PLATELET
Basophils Absolute: 0 10*3/uL (ref 0.0–0.2)
Basos: 1 %
EOS (ABSOLUTE): 0.2 10*3/uL (ref 0.0–0.4)
Eos: 3 %
Hematocrit: 51.5 % — ABNORMAL HIGH (ref 37.5–51.0)
Hemoglobin: 17.9 g/dL — ABNORMAL HIGH (ref 13.0–17.7)
Immature Grans (Abs): 0 10*3/uL (ref 0.0–0.1)
Immature Granulocytes: 0 %
Lymphocytes Absolute: 1.7 10*3/uL (ref 0.7–3.1)
Lymphs: 28 %
MCH: 31.8 pg (ref 26.6–33.0)
MCHC: 34.8 g/dL (ref 31.5–35.7)
MCV: 92 fL (ref 79–97)
Monocytes Absolute: 0.6 10*3/uL (ref 0.1–0.9)
Monocytes: 9 %
Neutrophils Absolute: 3.8 10*3/uL (ref 1.4–7.0)
Neutrophils: 59 %
Platelets: 218 10*3/uL (ref 150–450)
RBC: 5.63 x10E6/uL (ref 4.14–5.80)
RDW: 12.7 % (ref 11.6–15.4)
WBC: 6.3 10*3/uL (ref 3.4–10.8)

## 2022-07-07 LAB — PSA: Prostate Specific Ag, Serum: 1.4 ng/mL (ref 0.0–4.0)

## 2022-07-07 LAB — LIPID PANEL
Chol/HDL Ratio: 4.2 ratio (ref 0.0–5.0)
Cholesterol, Total: 220 mg/dL — ABNORMAL HIGH (ref 100–199)
HDL: 53 mg/dL (ref >39)
LDL Chol Calc (NIH): 126 mg/dL — ABNORMAL HIGH (ref 0–99)
Triglycerides: 232 mg/dL — ABNORMAL HIGH (ref 0–149)
VLDL Cholesterol Cal: 41 mg/dL — ABNORMAL HIGH (ref 5–40)

## 2022-07-07 MED ORDER — ATORVASTATIN CALCIUM 80 MG PO TABS
80.0000 mg | ORAL_TABLET | Freq: Every day | ORAL | 0 refills | Status: DC
  Filled 2022-07-07 – 2022-08-08 (×2): qty 90, 90d supply, fill #0

## 2022-07-12 ENCOUNTER — Encounter: Payer: Self-pay | Admitting: Gastroenterology

## 2022-07-15 ENCOUNTER — Other Ambulatory Visit (HOSPITAL_COMMUNITY): Payer: Self-pay

## 2022-07-26 DIAGNOSIS — L570 Actinic keratosis: Secondary | ICD-10-CM | POA: Diagnosis not present

## 2022-07-26 DIAGNOSIS — D225 Melanocytic nevi of trunk: Secondary | ICD-10-CM | POA: Diagnosis not present

## 2022-07-26 DIAGNOSIS — L281 Prurigo nodularis: Secondary | ICD-10-CM | POA: Diagnosis not present

## 2022-07-26 DIAGNOSIS — Z86018 Personal history of other benign neoplasm: Secondary | ICD-10-CM | POA: Diagnosis not present

## 2022-07-26 DIAGNOSIS — L814 Other melanin hyperpigmentation: Secondary | ICD-10-CM | POA: Diagnosis not present

## 2022-07-26 DIAGNOSIS — D2272 Melanocytic nevi of left lower limb, including hip: Secondary | ICD-10-CM | POA: Diagnosis not present

## 2022-07-26 DIAGNOSIS — L578 Other skin changes due to chronic exposure to nonionizing radiation: Secondary | ICD-10-CM | POA: Diagnosis not present

## 2022-07-27 ENCOUNTER — Ambulatory Visit (AMBULATORY_SURGERY_CENTER): Payer: Self-pay

## 2022-07-27 ENCOUNTER — Other Ambulatory Visit (HOSPITAL_COMMUNITY): Payer: Self-pay

## 2022-07-27 VITALS — Ht 73.0 in | Wt 238.0 lb

## 2022-07-27 DIAGNOSIS — Z86010 Personal history of colonic polyps: Secondary | ICD-10-CM

## 2022-07-27 MED ORDER — NA SULFATE-K SULFATE-MG SULF 17.5-3.13-1.6 GM/177ML PO SOLN
1.0000 | Freq: Once | ORAL | 0 refills | Status: AC
  Filled 2022-07-27 – 2022-08-08 (×2): qty 354, 1d supply, fill #0

## 2022-07-27 NOTE — Progress Notes (Signed)
No egg or soy allergy known to patient  No issues known to pt with past sedation with any surgeries or procedures Patient denies ever being told they had issues or difficulty with intubation  No FH of Malignant Hyperthermia Pt is not on diet pills Pt is not on  home 02  Pt is not on blood thinners  Pt denies issues with constipation  No A fib or A flutter Have any cardiac testing pending--no Pt instructed to use Singlecare.com or GoodRx for a price reduction on prep  Patient's chart reviewed by Osvaldo Angst CNRA prior to previsit and patient appropriate for the [facility].  Previsit completed and red dot placed by patient's name on their procedure day (on provider's schedule).   Patient's chart reviewed by Osvaldo Angst CNRA prior to previsit and patient appropriate for the [facility].  Previsit completed and red dot placed by patient's name on their procedure day (on provider's schedule).

## 2022-08-05 ENCOUNTER — Other Ambulatory Visit (HOSPITAL_COMMUNITY): Payer: Self-pay

## 2022-08-08 ENCOUNTER — Other Ambulatory Visit (HOSPITAL_COMMUNITY): Payer: Self-pay

## 2022-08-09 ENCOUNTER — Other Ambulatory Visit (INDEPENDENT_AMBULATORY_CARE_PROVIDER_SITE_OTHER): Payer: 59

## 2022-08-09 DIAGNOSIS — Z23 Encounter for immunization: Secondary | ICD-10-CM

## 2022-08-18 ENCOUNTER — Encounter: Payer: Self-pay | Admitting: Gastroenterology

## 2022-08-22 ENCOUNTER — Encounter: Payer: Self-pay | Admitting: Gastroenterology

## 2022-08-22 ENCOUNTER — Telehealth: Payer: Self-pay | Admitting: Gastroenterology

## 2022-08-22 ENCOUNTER — Ambulatory Visit (AMBULATORY_SURGERY_CENTER): Payer: 59 | Admitting: Gastroenterology

## 2022-08-22 VITALS — BP 130/83 | HR 58 | Temp 96.8°F | Resp 24 | Ht 73.0 in | Wt 238.0 lb

## 2022-08-22 DIAGNOSIS — Z09 Encounter for follow-up examination after completed treatment for conditions other than malignant neoplasm: Secondary | ICD-10-CM

## 2022-08-22 DIAGNOSIS — D122 Benign neoplasm of ascending colon: Secondary | ICD-10-CM | POA: Diagnosis not present

## 2022-08-22 DIAGNOSIS — Z86010 Personal history of colonic polyps: Secondary | ICD-10-CM

## 2022-08-22 DIAGNOSIS — Z1211 Encounter for screening for malignant neoplasm of colon: Secondary | ICD-10-CM | POA: Diagnosis not present

## 2022-08-22 MED ORDER — SODIUM CHLORIDE 0.9 % IV SOLN: 500.0000 mL | Freq: Once | INTRAVENOUS | Status: DC

## 2022-08-22 NOTE — Progress Notes (Signed)
Pt resting comfortably. VSS. Airway intact. SBAR complete to RN. All questions answered.   

## 2022-08-22 NOTE — Telephone Encounter (Signed)
Patient has colonoscopy today and is running behind. He is on the way. He was to be here at 1030am

## 2022-08-22 NOTE — Progress Notes (Signed)
History & Physical  Primary Care Physician:  Rita Ohara, MD Primary Gastroenterologist: Lucio Edward, MD  CHIEF COMPLAINT:  Personal history of colon polyps   HPI: Larry Cruz is a 51 y.o. male with a personal history of adenomatous and sessile serrated colon polyps for surveillance colonoscopy.   Past Medical History:  Diagnosis Date   Aseptic meningitis    Colorblind    Dyslipidemia    Headache    Herpes genitalis in men    Hx of elbow surgery 09/02/2019   right elbow percutaneous ECRB release/lateral elbow percutaneous release-Dr.Gramig   Hyperlipidemia    Lateral epicondylitis of right elbow 03/2018   s/p injection by Dr. Micheline Chapman; MRI 10/2018, f/b eval by Dr. Amedeo Plenty   Low back pain    Meningitis 2019   negative cultures   Torn tendon    R elbow    Past Surgical History:  Procedure Laterality Date   CERVICAL DISC SURGERY  2009   JONES, MD; fusion   ELBOW SURGERY  09/02/2019   right elbow percutaneous ECRB release/lateral elbow percutaneous release-Dr. Amedeo Plenty   LATERAL EPICONDYLE RELEASE Right    percutaneous--done by Dr. Amedeo Plenty in either 08/2019 or 09/2019   LUMBAR DISC SURGERY  2000   CARTER, MD; L4-L5 (right)   LUMBAR DISC SURGERY  11/2013   L4-5, Dr. Ronnald Ramp (left)   SHOULDER ARTHROSCOPY Right 12/30/2021   Procedure: ARTHROSCOPY SHOULDER/DEBRIDEMENT;  Surgeon: Hiram Gash, MD;  Location: [facility];  Service: Orthopedics;  Laterality: Right;   SHOULDER ARTHROSCOPY WITH DISTAL CLAVICLE RESECTION Right 12/30/2021   Procedure: SHOULDER ARTHROSCOPY WITH DISTAL CLAVICLE EXCISION;  Surgeon: Hiram Gash, MD;  Location: [facility];  Service: Orthopedics;  Laterality: Right;   SHOULDER ARTHROSCOPY WITH SUBACROMIAL DECOMPRESSION, ROTATOR CUFF REPAIR AND BICEP TENDON REPAIR Right 12/30/2021   Procedure: SHOULDER ARTHROSCOPY WITH SUBACROMIAL DECOMPRESSION, ROTATOR CUFF REPAIR AND BICEP TENDON REPAIR;  Surgeon: Hiram Gash, MD;  Location:  [facility];  Service: Orthopedics;  Laterality: Right;   VASECTOMY  11/09    Prior to Admission medications   Medication Sig Start Date End Date Taking? Authorizing Provider  atorvastatin (LIPITOR) 80 MG tablet Take 1 tablet (80 mg total) by mouth daily. 07/07/22  Yes Rita Ohara, MD  Esomeprazole Magnesium (NEXIUM 24HR PO) Take 1 capsule by mouth daily as needed. Patient not taking: Reported on 07/06/2022    [provider]  Multiple Vitamin (MULTIVITAMIN) tablet Take 1 tablet by mouth daily.    [provider]  valACYclovir (VALTREX) 500 MG tablet Take 1 tablet (500 mg total) by mouth 2 (two) times daily for 3 days as needed for outbreaks. Patient not taking: Reported on 07/06/2022 12/13/21   Rita Ohara, MD  zolpidem (AMBIEN) 10 MG tablet Take 1 tablet (10 mg total) by mouth at bedtime as needed for sleep. Patient not taking: Reported on 07/06/2022 12/13/21   Rita Ohara, MD    Current Outpatient Medications  Medication Sig Dispense Refill   atorvastatin (LIPITOR) 80 MG tablet Take 1 tablet (80 mg total) by mouth daily. 90 tablet 0   Esomeprazole Magnesium (NEXIUM 24HR PO) Take 1 capsule by mouth daily as needed. (Patient not taking: Reported on 07/06/2022)     Multiple Vitamin (MULTIVITAMIN) tablet Take 1 tablet by mouth daily.     valACYclovir (VALTREX) 500 MG tablet Take 1 tablet (500 mg total) by mouth 2 (two) times daily for 3 days as needed for outbreaks. (Patient not taking:  Reported on 07/06/2022) 30 tablet 0   zolpidem (AMBIEN) 10 MG tablet Take 1 tablet (10 mg total) by mouth at bedtime as needed for sleep. (Patient not taking: Reported on 07/06/2022) 30 tablet 0   Current Facility-Administered Medications  Medication Dose Route Frequency Provider Last Rate Last Admin   0.9 %  sodium chloride infusion  500 mL Intravenous Once Ladene Artist, MD        Allergies as of 08/22/2022   (No Known Allergies)    Family History  Problem Relation Age  of Onset   Hyperlipidemia Mother    Dementia Mother    Heart disease Father        CABG in mid 84's   Hypertension Father    Alzheimer's disease Father    Hyperlipidemia Father    Diabetes Father    Hyperlipidemia Sister    Hyperlipidemia Sister    Melanoma Sister    Breast cancer Sister 75   Hyperlipidemia Sister    Breast cancer Sister 51   Hyperlipidemia Sister    Hearing loss Sister        sudden onset, both ears, resolved in 66 only   Hyperlipidemia Sister    Hyperlipidemia Sister    Hyperlipidemia Sister    Hyperlipidemia Brother    Hypertension Brother    Bladder Cancer Brother    Hyperlipidemia Brother    Hearing loss Brother        sudden onset, resolved   Asthma Son    Colon cancer Neg Hx    Esophageal cancer Neg Hx    Rectal cancer Neg Hx    Stomach cancer Neg Hx     Social History   Socioeconomic History   Marital status: Married    Spouse name: Not on file   Number of children: 2   Years of education: Not on file   Highest education level: Master's degree (e.g., MA, MS, MEng, MEd, MSW, MBA)  Occupational History   Occupation: Development worker, [employer]    Employer: [employer]  Tobacco Use   Smoking status: Never   Smokeless tobacco: Never  Vaping Use   Vaping Use: Never used  Substance and Sexual Activity   Alcohol use: Yes    Comment: 3-4/week   Drug use: No   Sexual activity: Yes    Partners: Female    Birth control/protection: Surgical    Comment: vasectomy  Other Topics Concern   Not on file  Social History Narrative   Lives with wife, daughter, son and dog.   Daughter attends [employer], and is in the marching band.   Right handed   Caffeine: 1-2 cups of coffee daily   Social Determinants of Health   Financial Resource Strain: Not on file  Food Insecurity: Not on file  Transportation Needs: Not on file  Physical Activity: Not on file  Stress: Not on file  Social Connections: Not on file  Intimate Partner Violence: Not  on file    Review of Systems:  All systems reviewed were negative except where noted in HPI.   Physical Exam: General:  Alert, well-developed, in NAD Head:  Normocephalic and atraumatic. Eyes:  Sclera clear, no icterus.   Conjunctiva pink. Ears:  Normal auditory acuity. Mouth:  No deformity or lesions.  Neck:  Supple; no masses . Lungs:  Clear throughout to auscultation.   No wheezes, crackles, or rhonchi. No acute distress. Heart:  Regular rate and rhythm; no murmurs. Abdomen:  Soft, nondistended, nontender. No  masses, hepatomegaly. No obvious masses.  Normal bowel .    Rectal:  Deferred   Msk:  Symmetrical without gross deformities.. Pulses:  Normal pulses noted. Extremities:  Without edema. Neurologic:  Alert and  oriented x4;  grossly normal neurologically. Skin:  Intact without significant lesions or rashes. Psych:  Alert and cooperative. Normal mood and affect.   Impression / Plan:   Personal history of adenomatous and sessile serrated colon polyps for surveillance colonoscopy.  Pricilla Riffle. Fuller Plan  08/22/2022, 12:02 PM See Shea Evans, [facility], to contact our on call provider

## 2022-08-22 NOTE — Op Note (Signed)
[facility] Patient Name: Larry Cruz Procedure Date: 08/22/2022 11:53 AM MRN: [id number] Endoscopist: Ladene Artist , MD, [id number] Age: 51 Referring MD:  Date of Birth: 04-14-1971 Gender: Male Account #: [id number] Procedure:                Colonoscopy Indications:              Surveillance: Personal history of adenomatous and                            sessile serrated polyps on last colonoscopy 3 years                            ago Medicines:                Monitored Anesthesia Care Procedure:                Pre-Anesthesia Assessment:                           - Prior to the procedure, a History and Physical                            was performed, and patient medications and                            allergies were reviewed. The patient's tolerance of                            previous anesthesia was also reviewed. The risks                            and benefits of the procedure and the sedation                            options and risks were discussed with the patient.                            All questions were answered, and informed consent                            was obtained. Prior Anticoagulants: The patient has                            taken no anticoagulant or antiplatelet agents. ASA                            Grade Assessment: II - A patient with mild systemic                            disease. After reviewing the risks and benefits,                            the patient was deemed in satisfactory condition to  undergo the procedure.                           After obtaining informed consent, the colonoscope                            was passed under direct vision. Throughout the                            procedure, the patient's blood pressure, pulse, and                            oxygen saturations were monitored continuously. The                            CF [id number] #[id number] was introduced through the anus                             and advanced to the the cecum, identified by                            appendiceal orifice and ileocecal valve. The                            ileocecal valve, appendiceal orifice, and rectum                            were photographed. The quality of the bowel                            preparation was good. The colonoscopy was somewhat                            difficult due to significant looping and a tortuous                            colon. The patient tolerated the procedure well. Scope In: 12:12:33 PM Scope Out: 12:30:07 PM Scope Withdrawal Time: 0 hours 9 minutes 23 seconds  Total Procedure Duration: 0 hours 17 minutes 34 seconds  Findings:                 The perianal and digital rectal examinations were                            normal.                           A 6 mm polyp was found in the ascending colon. The                            polyp was sessile. The polyp was removed with a                            cold snare. Resection and retrieval were complete.  Internal hemorrhoids were found during                            retroflexion. The hemorrhoids were small and Grade                            I (internal hemorrhoids that do not prolapse).                           The exam was otherwise without abnormality on                            direct and retroflexion views. Complications:            No immediate complications. Estimated blood loss:                            None. Estimated Blood Loss:     Estimated blood loss: none. Impression:               - One 6 mm polyp in the ascending colon, removed                            with a cold snare. Resected and retrieved.                           - Internal hemorrhoids.                           - The examination was otherwise normal on direct                            and retroflexion views. Recommendation:           - Repeat colonoscopy after studies are  complete for                            surveillance based on pathology results.                           - Patient has a contact number available for                            emergencies. The signs and symptoms of potential                            delayed complications were discussed with the                            patient. Return to normal activities tomorrow.                            Written discharge instructions were provided to the                            patient.                           -  Resume previous diet.                           - Continue present medications.                           - Await pathology results. Ladene Artist, MD 08/22/2022 12:32:44 PM This report has been signed electronically.

## 2022-08-22 NOTE — Progress Notes (Signed)
Pt's states no medical or surgical changes since previsit or office visit. 

## 2022-08-22 NOTE — Patient Instructions (Addendum)
YOU HAD AN ENDOSCOPIC PROCEDURE TODAY AT [facility]:   Refer to the procedure report that was given to you for any specific questions about what was found during the examination.  If the procedure report does not answer your questions, please call your gastroenterologist to clarify.  If you requested that your care partner not be given the details of your procedure findings, then the procedure report has been included in a sealed envelope for you to review at your convenience later.  YOU SHOULD EXPECT: Some feelings of bloating in the abdomen. Passage of more gas than usual.  Walking can help get rid of the air that was put into your GI tract during the procedure and reduce the bloating. If you had a lower endoscopy (such as a colonoscopy or flexible sigmoidoscopy) you may notice spotting of blood in your stool or on the toilet paper. If you underwent a bowel prep for your procedure, you may not have a normal bowel movement for a few days.  Please Note:  You might notice some irritation and congestion in your nose or some drainage.  This is from the oxygen used during your procedure.  There is no need for concern and it should clear up in a day or so.  SYMPTOMS TO REPORT IMMEDIATELY:  Following lower endoscopy (colonoscopy or flexible sigmoidoscopy):  Excessive amounts of blood in the stool  Significant tenderness or worsening of abdominal pains  Swelling of the abdomen that is new, acute  Fever of 100F or higher   For urgent or emergent issues, a gastroenterologist can be reached at any hour by calling [phone]. Do not use MyChart messaging for urgent concerns.    DIET:  We do recommend a small meal at first, but then you may proceed to your regular diet.  Drink plenty of fluids but you should avoid alcoholic beverages for 24 hours.  ACTIVITY:  You should plan to take it easy for the rest of today and you should NOT DRIVE or use heavy machinery until tomorrow (because  of the sedation medicines used during the test).    FOLLOW UP: Our staff will call the number listed on your records the next business day following your procedure.  We will call around 7:15- 8:00 am to check on you and address any questions or concerns that you may have regarding the information given to you following your procedure. If we do not reach you, we will leave a message.     If any biopsies were taken you will be contacted by phone or by letter within the next 1-3 weeks.  Please call us at [phone] if you have not heard about the biopsies in 3 weeks.    SIGNATURES/CONFIDENTIALITY: You and/or your care partner have signed paperwork which will be entered into your electronic medical record.  These signatures attest to the fact that that the information above on your After Visit Summary has been reviewed and is understood.  Full responsibility of the confidentiality of this discharge information lies with you and/or your care-partner.    Repeat colonoscopy after studies are complete for surveillance based on pathology results. - Patient has a contact number available for emergencies. The signs and symptoms of potential delayed complications were discussed with the patient. Return to normal activities tomorrow. Written discharge instructions were provided to the patient.

## 2022-08-22 NOTE — Progress Notes (Signed)
Called to room to assist during endoscopic procedure.  Patient ID and intended procedure confirmed with present staff. Received instructions for my participation in the procedure from the performing physician.  

## 2022-08-23 ENCOUNTER — Telehealth: Payer: Self-pay | Admitting: *Deleted

## 2022-08-23 NOTE — Telephone Encounter (Signed)
Post procedure follow up phone call. No answer at number given.  Left message on voicemail.  

## 2022-08-25 ENCOUNTER — Ambulatory Visit (HOSPITAL_BASED_OUTPATIENT_CLINIC_OR_DEPARTMENT_OTHER)
Admission: RE | Admit: 2022-08-25 | Discharge: 2022-08-25 | Disposition: A | Discharge status: Home / Self Care | Payer: 59 | Source: Ambulatory Visit | Attending: Family Medicine | Admitting: Family Medicine

## 2022-08-25 DIAGNOSIS — E782 Mixed hyperlipidemia: Secondary | ICD-10-CM | POA: Insufficient documentation

## 2022-08-31 ENCOUNTER — Encounter: Payer: Self-pay | Admitting: Gastroenterology

## 2022-09-01 DIAGNOSIS — H524 Presbyopia: Secondary | ICD-10-CM | POA: Diagnosis not present

## 2022-09-13 ENCOUNTER — Other Ambulatory Visit: Payer: Self-pay | Admitting: Family Medicine

## 2022-09-13 ENCOUNTER — Other Ambulatory Visit (HOSPITAL_COMMUNITY): Payer: Self-pay

## 2022-09-13 DIAGNOSIS — G4700 Insomnia, unspecified: Secondary | ICD-10-CM

## 2022-09-13 MED ORDER — ZOLPIDEM TARTRATE 10 MG PO TABS
10.0000 mg | ORAL_TABLET | Freq: Every evening | ORAL | 0 refills | Status: AC | PRN
  Filled 2022-09-13: qty 30, 30d supply, fill #0

## 2022-09-14 ENCOUNTER — Other Ambulatory Visit: Payer: Self-pay

## 2022-09-18 DIAGNOSIS — Z760 Encounter for issue of repeat prescription: Secondary | ICD-10-CM | POA: Diagnosis not present

## 2022-09-23 ENCOUNTER — Other Ambulatory Visit: Payer: Self-pay

## 2022-09-27 ENCOUNTER — Encounter: Payer: Self-pay | Admitting: Internal Medicine

## 2022-09-27 ENCOUNTER — Encounter: Payer: Self-pay | Admitting: *Deleted

## 2022-11-21 IMAGING — MR MR SHOULDER*R* W/O CM
4 of 6 series · 19 of 40 positions shown · non-contrast
Comparison: None.

CLINICAL DATA: Tear of right rotator cuff, unspecified tear extent,
unspecified whether traumatic. No previous relevant surgery.

EXAM:
MRI OF THE RIGHT SHOULDER WITHOUT CONTRAST
TECHNIQUE: Multiplanar, multisequence MR imaging of the shoulder was performed.
No intravenous contrast was administered.

[Series 3: T2 fat-sat · axial · 4.0mm · 0.27mm/px · z∈[-90,+5]mm · 6 of 25 slices shown (1 of 3)]
[im 1/25]
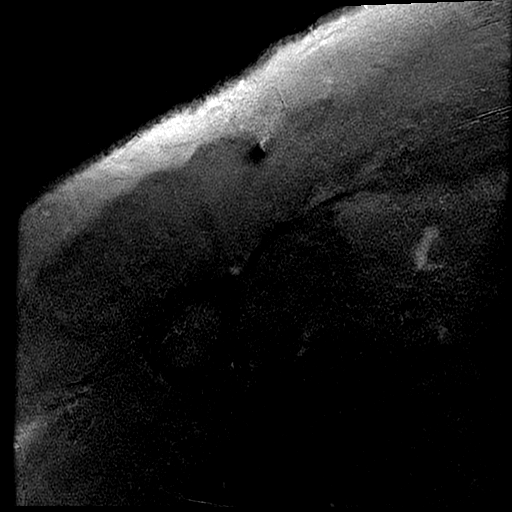
[im 5/25]
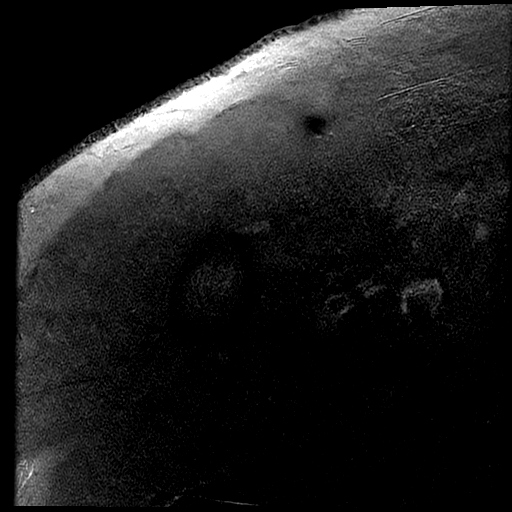
[im 9/25]
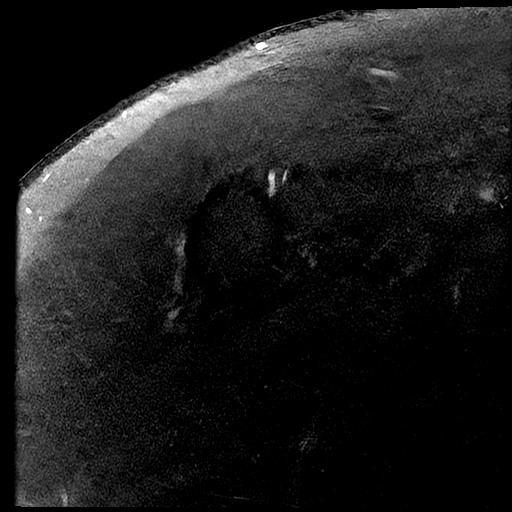
[im 13/25]
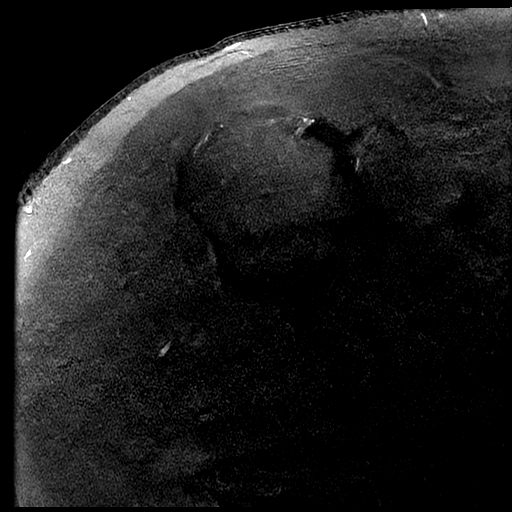
[im 17/25]
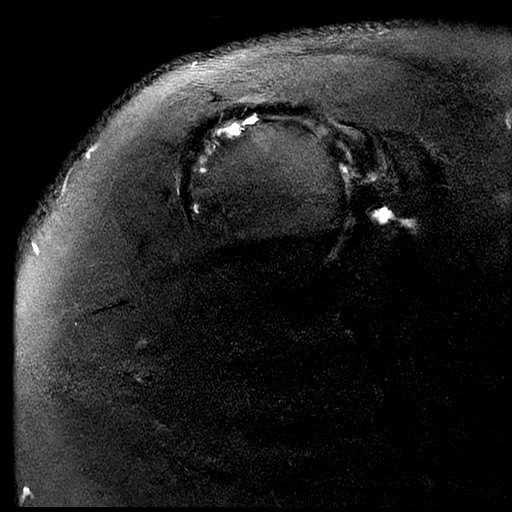
[im 21/25]
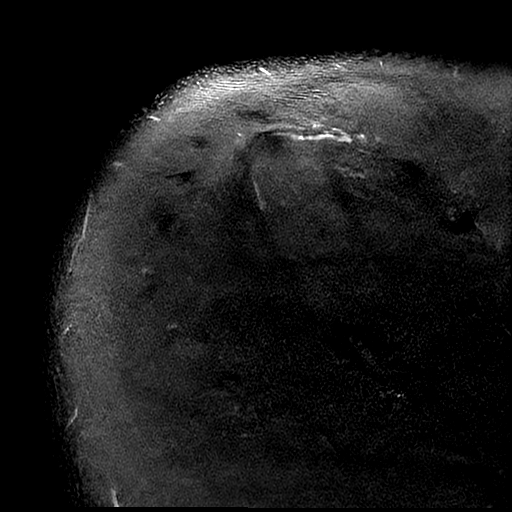

[Series 4: T2 fat-sat · axial · 4.0mm · 0.31mm/px · z∈[-66,+10]mm · 3 of 25 slices shown (2 of 3)]
[im 5/25]
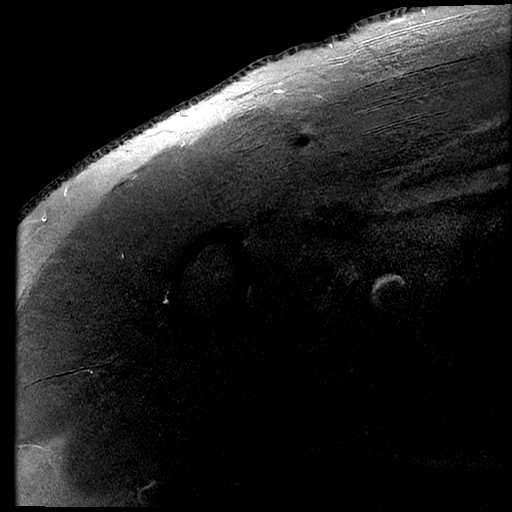
[im 13/25]
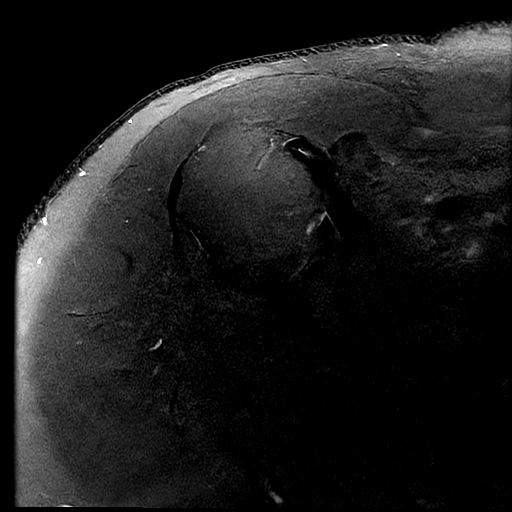
[im 21/25]
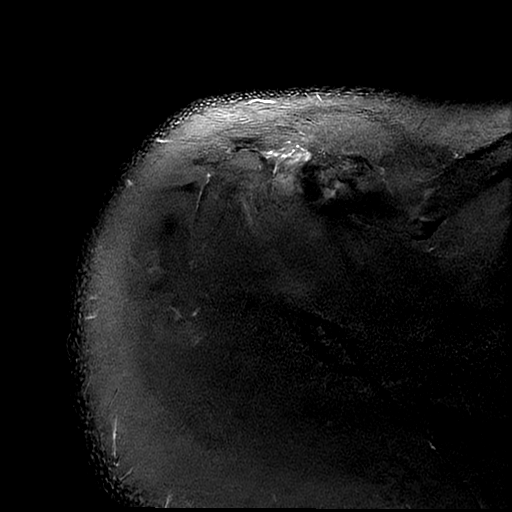

[Series 5: T2 fat-sat · oblique · 4.0mm · 0.31mm/px · 3 of 24 slices shown (3 of 3)]
[im 4/24]
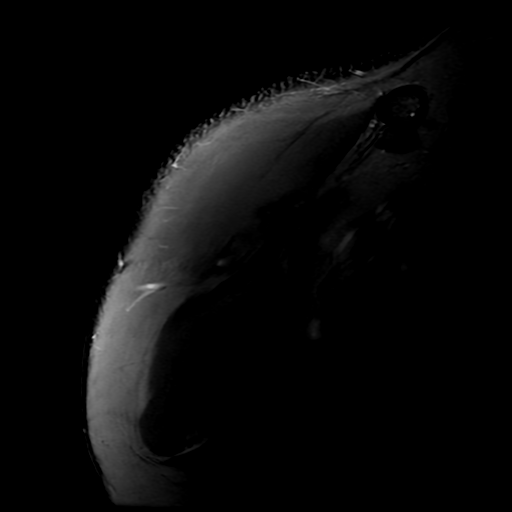
[im 12/24]
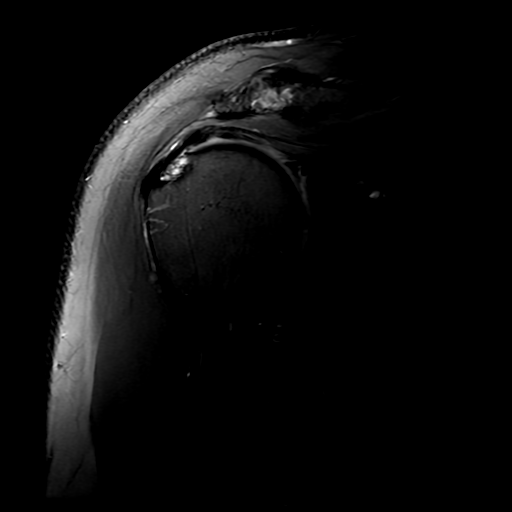
[im 20/24]
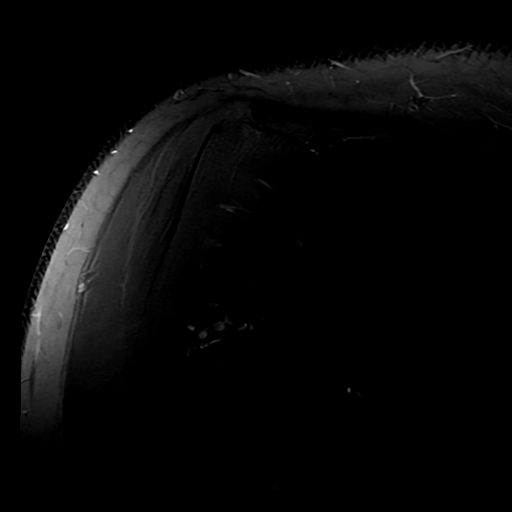

[Series 6: PD fat-sat · oblique · 4.0mm · 0.31mm/px · 7 of 24 slices shown]
[im 1/24]
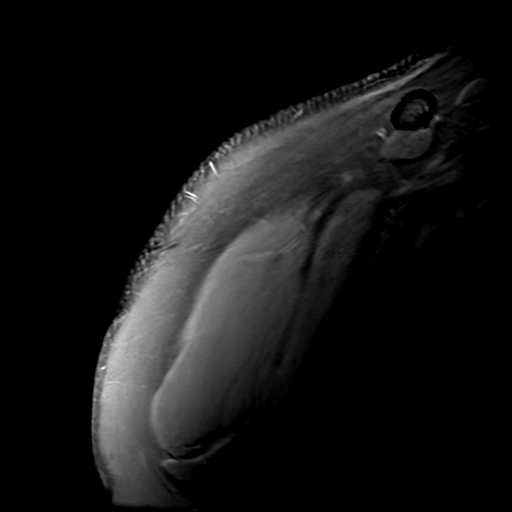
[im 4/24]
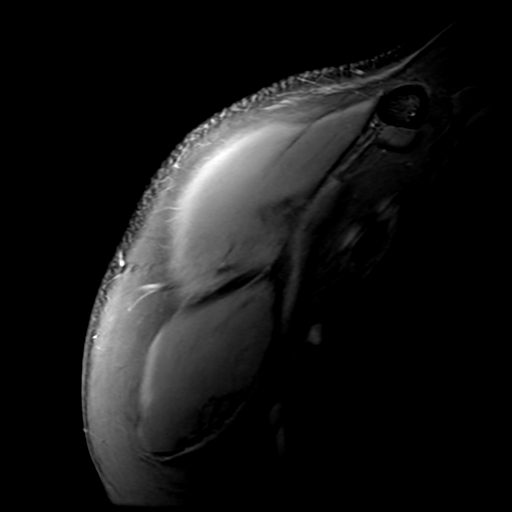
[im 8/24]
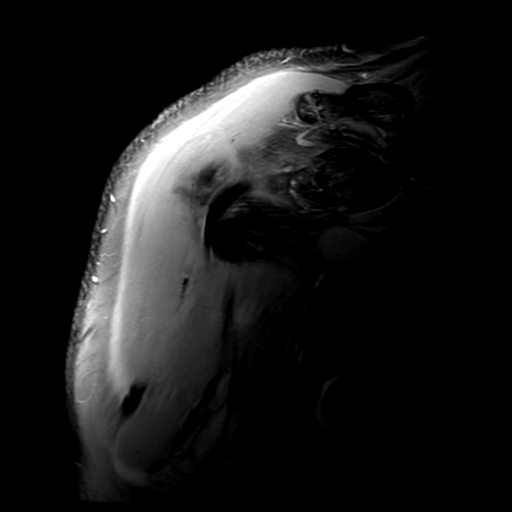
[im 12/24]
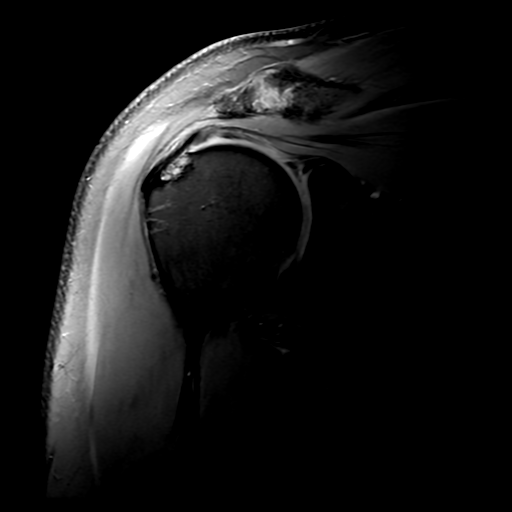
[im 16/24]
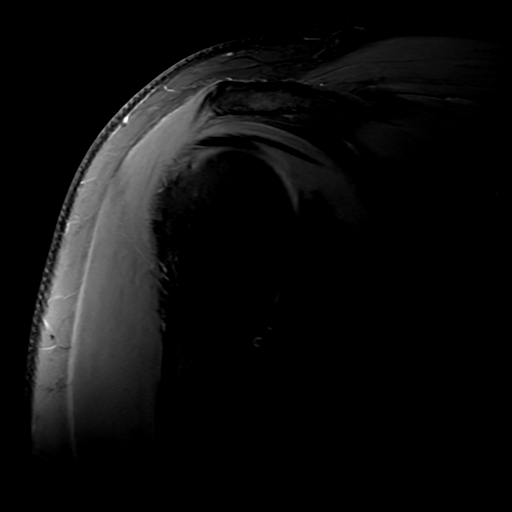
[im 20/24]
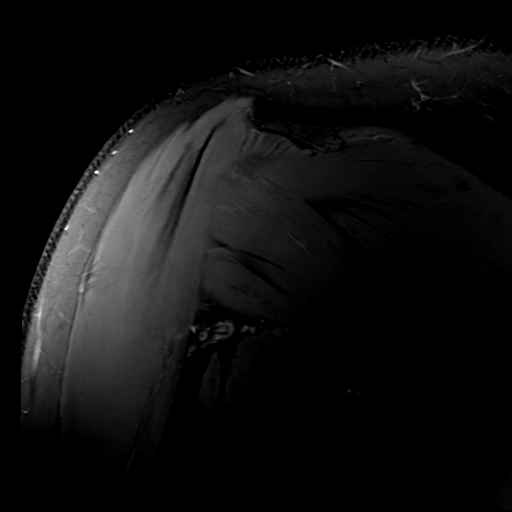
[im 24/24]
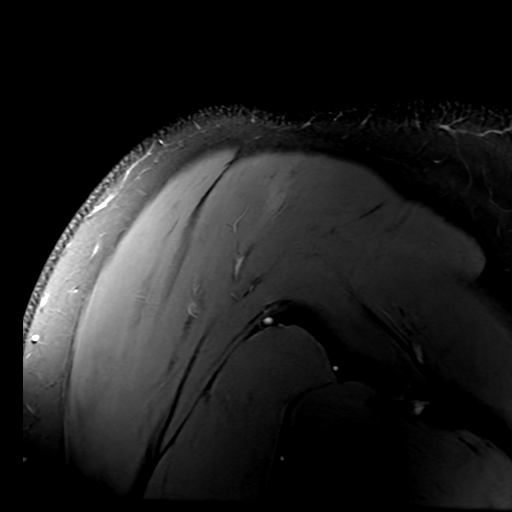

[19 of 40 positions shown; findings below may reference images not displayed]

FINDINGS: Rotator cuff: Tendinosis with partial intrasubstance and articular
surface insertional tearing of the infraspinatus tendon. No
full-thickness tendon tear or tendon retraction. Mild supraspinatus
tendinosis without tear. The subscapularis and teres minor tendons
appear normal.

Muscles:  No focal muscular atrophy or edema.

Biceps long head:  Intact and normally positioned.

Acromioclavicular Joint: The acromion is type 2. There is
fragmentation of the distal clavicle anteriorly which is likely due
to an old fracture. There are moderate acromioclavicular
degenerative changes. No significant fluid is present in the
subacromial - subdeltoid bursa.

Glenohumeral Joint: Mild glenohumeral degenerative changes. No
significant shoulder joint effusion.

Labrum: Labral assessment limited by the lack of joint fluid. No
definite labral tear or paralabral cyst.

Bones: No acute or significant extra-articular osseous findings.As
above, suspected old intra-articular fracture of the distal
clavicle.

Other: No significant soft tissue findings.
IMPRESSION: 1. Infraspinatus tendinosis with partial intrasubstance and
articular surface insertional tearing. No full-thickness rotator
cuff tear, tendon retraction or muscular atrophy.
2. Supraspinatus tendinosis without evidence of tear. The labrum and
biceps tendon appear intact.
3. Suspected old fracture of the distal right clavicle with moderate
acromioclavicular degenerative changes.

## 2023-01-25 ENCOUNTER — Encounter: Payer: Self-pay | Admitting: Family Medicine

## 2023-01-25 ENCOUNTER — Other Ambulatory Visit (HOSPITAL_COMMUNITY): Payer: Self-pay

## 2023-01-25 ENCOUNTER — Ambulatory Visit: Payer: Commercial Managed Care - PPO | Admitting: Family Medicine

## 2023-01-25 VITALS — BP 120/70 | HR 76 | Temp 97.3°F | Ht 73.0 in | Wt 238.6 lb

## 2023-01-25 DIAGNOSIS — J302 Other seasonal allergic rhinitis: Secondary | ICD-10-CM

## 2023-01-25 DIAGNOSIS — H60502 Unspecified acute noninfective otitis externa, left ear: Secondary | ICD-10-CM

## 2023-01-25 DIAGNOSIS — L255 Unspecified contact dermatitis due to plants, except food: Secondary | ICD-10-CM

## 2023-01-25 MED ORDER — TRIAMCINOLONE ACETONIDE 0.1 % EX CREA
TOPICAL_CREAM | CUTANEOUS | 0 refills | Status: DC
  Filled 2023-01-25: qty 45, 15d supply, fill #0

## 2023-01-25 MED ORDER — NEOMYCIN-POLYMYXIN-HC 3.5-10000-1 OT SOLN
4.0000 [drp] | Freq: Four times a day (QID) | OTIC | 0 refills | Status: DC
Start: 2023-01-25 — End: 2023-07-31
  Filled 2023-01-25: qty 10, 7d supply, fill #0

## 2023-01-25 NOTE — Patient Instructions (Signed)
Use the ear drops to treat the external ear infection. If you aren't improving, or if worse, you may need to see your ENT due to your narrow canals and history of resistant infections.  Allergies may be contributing to some of your ear symptoms.  Take your zyrtec more regularly--daily until allergies/pollen subsides. Use it PRIOR to mowing the yard, and consider wearing a mask or doing a saline sinus rinse upon coming inside. Consider using Flonase regularly (2 gentle sniffs once daily, aiming slightly away from the center) and use this daily throughout allergy season.  Use the steroid cream up to twice daily for up to 2 weeks, if needed for rash related to poison ivy or other plant dermatitis. Prevention is key!

## 2023-01-25 NOTE — Progress Notes (Signed)
Chief Complaint  Patient presents with   Ear Pain    Left ear pain 24-36hrs ago. Gets ear infections every other year or so.    Has some allergies flaring currently. Itchy eyes, discomfort at upper nose, some PND, sore throat.  Occasional cough. L ear feels like there is something in it, feels swollen. He hears popping, bubbling and has discomfort in front of the year. Denies jaw pain, but has some discomfort with clenching  Gets ear infection every other year. Has narrow canals, has needed to see ENT in the past (thinks usually external infection).  He has been mowing his yard, which causes his allergies to flare. He has been taking zyrtec after he comes in from mowing, rather than prior to exposure.  He also reports having some poison ivy (vs other plant) dermatitis.  Has areas of rash on leg, arm, butt crack 2nd time he has had it this year (first was in February); sometimes his reactions are more severe and requires steroids. Current outbreak started 2 weekends ago. First noted at L wrist/forearm. Still spreading some, but thinks it overall is getting better, "at the tail end".  PMH, PSH, SH reviewed  Outpatient Encounter Medications as of 01/25/2023  Medication Sig Note   atorvastatin (LIPITOR) 80 MG tablet Take 1 tablet (80 mg total) by mouth daily.    cetirizine (ZYRTEC) 10 MG tablet Take 10 mg by mouth daily. 01/25/2023: Uses prn only, just a few times in the past 10 days   Esomeprazole Magnesium (NEXIUM 24HR PO) Take 1 capsule by mouth daily as needed. 01/25/2023: Uses prn, using pretty regularly in the last 10 days, goes weeks without   Multiple Vitamin (MULTIVITAMIN) tablet Take 1 tablet by mouth daily. 07/06/2022:     valACYclovir (VALTREX) 500 MG tablet Take 1 tablet (500 mg total) by mouth 2 (two) times daily for 3 days as needed for outbreaks. (Patient not taking: Reported on 07/06/2022) 01/25/2023: As needed   zolpidem (AMBIEN) 10 MG tablet Take 1 tablet (10 mg total) by mouth at  bedtime as needed for sleep. (Patient not taking: Reported on 01/25/2023) 01/25/2023: As needed   Facility-Administered Encounter Medications as of 01/25/2023  Medication   0.9 %  sodium chloride infusion   No Known Allergies  ROS:  Allergy and ear pain per HPI. Feels inflamed between eyes and upper nose. Has HA at left side of head currently.   Denies fever, chills, n/v/d.  Rash per HPI. No dizziness, chest pain, shortness of breath or other concerns.   PHYSICAL EXAM:  BP 120/70   Pulse 76   Temp (!) 97.3 F (36.3 C) (Tympanic)   Ht 6\' 1"  (1.854 m)   Wt 238 lb 9.6 oz (108.2 kg)   BMI 31.48 kg/m   Well-appearing, pleasant male, in no distress HEENT: conjunctiva and sclera are clear, EOMI. R TM and EAC normal L canal--tender to exam (with insertion of otoscope speculum); canal is somewhat swollen. TM is partly visualized and appears normal.  He has discomfort with movement of external ear (auricle and anteriorly) OP clear. Nasal mucosa is moderately edematous with clear mucus L>R.. Very mild maxillary sinus tenderness Neck: No lymphadenopathy or mass Heart: regular rate and rhythm Lungs: clear bilaterally Skin: Left forearm--one linear area of papules/vesicles. Remainder of visualized skin is normal (not undressed). Psych: normal mood, affect, hygiene and grooming Neuro: alert and oriented, cranial nerves grossly intact, normal gait.   ASSESSMENT/PLAN:  Acute otitis externa of left ear, unspecified type -  treat with cortisporin otci; f/u with ENT if persistent pain. suspect some component of ETD from allergies - Plan: neomycin-polymyxin-hydrocortisone (CORTISPORIN) OTIC solution  Plant dermatitis - sx x 2 weeks, still some spread, but overall very limited area. Reviewed use of topical steroids, and prevention - Plan: triamcinolone cream (KENALOG) 0.1 %  Seasonal allergies - discussed regular use of flonase, proper use; discussed regular use of zyrtec. Minimize exposure (mask with  mowing vs sinus rinses after). no e/o infection   TAC for plant dermatitis. Not spready widely, just locally. Discussed indications (and risks) for oral steroids.   Use the ear drops to treat the external ear infection. If you aren't improving, or if worse, you may need to see your ENT due to your narrow canals and history of resistant infections.  Allergies may be contributing to some of your ear symptoms.  Take your zyrtec more regularly--daily until allergies/pollen subsides. Use it PRIOR to mowing the yard, and consider wearing a mask or doing a saline sinus rinse upon coming inside. Consider using Flonase regularly (2 gentle sniffs once daily, aiming slightly away from the center) and use this daily throughout allergy season.  Use the steroid cream up to twice daily for up to 2 weeks, if needed for rash related to poison ivy or other plant dermatitis. Prevention is key!   I spent 35 minutes dedicated to the care of this patient, including pre-visit review of records, face to face time, post-visit ordering of testing and documentation.

## 2023-02-01 ENCOUNTER — Other Ambulatory Visit: Payer: Self-pay | Admitting: Family Medicine

## 2023-02-01 ENCOUNTER — Other Ambulatory Visit (HOSPITAL_COMMUNITY): Payer: Self-pay

## 2023-02-01 DIAGNOSIS — E782 Mixed hyperlipidemia: Secondary | ICD-10-CM

## 2023-02-01 MED ORDER — ATORVASTATIN CALCIUM 80 MG PO TABS
80.0000 mg | ORAL_TABLET | Freq: Every day | ORAL | 1 refills | Status: DC
  Filled 2023-02-01: qty 90, 90d supply, fill #0
  Filled 2023-06-30: qty 90, 90d supply, fill #1

## 2023-04-03 ENCOUNTER — Other Ambulatory Visit: Payer: Self-pay | Admitting: Oncology

## 2023-04-03 DIAGNOSIS — Z006 Encounter for examination for normal comparison and control in clinical research program: Secondary | ICD-10-CM

## 2023-06-30 ENCOUNTER — Other Ambulatory Visit (HOSPITAL_COMMUNITY): Payer: Self-pay

## 2023-06-30 ENCOUNTER — Other Ambulatory Visit (HOSPITAL_BASED_OUTPATIENT_CLINIC_OR_DEPARTMENT_OTHER): Payer: Self-pay

## 2023-07-27 DIAGNOSIS — D2272 Melanocytic nevi of left lower limb, including hip: Secondary | ICD-10-CM | POA: Diagnosis not present

## 2023-07-27 DIAGNOSIS — D225 Melanocytic nevi of trunk: Secondary | ICD-10-CM | POA: Diagnosis not present

## 2023-07-27 DIAGNOSIS — L570 Actinic keratosis: Secondary | ICD-10-CM | POA: Diagnosis not present

## 2023-07-27 DIAGNOSIS — Z86018 Personal history of other benign neoplasm: Secondary | ICD-10-CM | POA: Diagnosis not present

## 2023-07-27 DIAGNOSIS — L578 Other skin changes due to chronic exposure to nonionizing radiation: Secondary | ICD-10-CM | POA: Diagnosis not present

## 2023-07-27 DIAGNOSIS — L814 Other melanin hyperpigmentation: Secondary | ICD-10-CM | POA: Diagnosis not present

## 2023-07-28 NOTE — Progress Notes (Unsigned)
No chief complaint on file.   Larry Cruz is a 52 y.o. male who presents for a complete physical.    Hyperlipidemia follow-up:  He reports compliance with atorvastatin 80mg  daily, and is trying to follow a lowfat, low cholesterol diet.  He denies any side effects.  He has had borderline LDL, chol ratios, elevated TG, and is due for recheck. It was recommended that he had omega-3 fish oil in addition to lowfat diet after last labs.  Fatty liver changes were noted on abdominal ultrasound in June 2021, with h/o elevated LFT's. He had calcium score of 0 in 08/2022.  His current diet includes red meat to 2-3x/week, +cheese (sandwich, snacks), eats eggs less often (prev 2-3 eggs/week), occasional ice cream; Italian/vinaigrette dressings, no mayo.  He reports being "heavy into soccer season", and daughter in the band at [employer]. 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  UPDATE DIET***  Lab Results  Component Value Date   CHOL 220 (H) 07/06/2022   HDL 53 07/06/2022   LDLCALC 126 (H) 07/06/2022   TRIG 232 (H) 07/06/2022   CHOLHDL 4.2 07/06/2022   Lab Results  Component Value Date   ALT 44 07/06/2022   AST 43 (H) 07/06/2022   ALKPHOS 110 07/06/2022   BILITOT 1.1 07/06/2022    GERD:  He reports having some intermittent reflux symptoms. Uses an OTC Nexium prn (every few weeks).  Unable to identify triggers, has some spicy foods.  He still occasionally has some RUQ pain, often if he skips a meal, on an empty stomach.  Resolves on its own.  Described as slightly crampy, not burning.  Can't tell if Nexium helps with this discomfort.  Pain is sporadic, less frequent than in the past. Feels better laying down, rather than sitting/standing.  Insomnia--intermittent. There may be weeks he may need Ambien 1-2 times, occasionally up to 4, but many weeks without.  Sometimes only needs 1/2 tablet. Last filled #30 on 09/13/22.  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  Last refilled Valtrex #30 11/2021. As long as he gets adequate sleep, he doesn't have outbreaks.  Has less than once a year.  H/o Right lateral epicondylitis, s/p percutaneous release by Dr. Amanda Pea, but has persistent discomfort, and hasn't been able to return to tennis. He can't sleep with his arm bent much.  He has spoken to Dr. Everardo Pacific about this. Might consider looking back into this, now that his shoulder has healed.  UPDATE ***  H/o chronic headaches since meningitis in 2019. Topamax wasn't helpful, ultimately resolved with Botox--had total of about 4-5 treatments.  He still has some mild infrequent headaches, usually related to dehydration or not getting enough sleep.  He had a severe headache with fever after Moderna booster 06/2021 (reminiscent of the meningitis HA).  Immunization History  Administered Date(s) Administered   Influenza Split 07/03/2012, 07/03/2014, 06/20/2019   Influenza-Unspecified 06/19/2018, 06/14/2021, 07/13/2022, 06/20/2023   Moderna Covid-19 Vaccine Bivalent Booster 42yrs & up 06/28/2021   Tdap 10/22/2012   Zoster Recombinant(Shingrix) 11/22/2021, 08/09/2022   Has had 3 COVID vaccines, booster in 08/2020. We have no records, not in [place], thinks was [employer].  Had reaction to the [employer] flu shots yearly through work [employer] employee) Last colonoscopy: 08/2022 with Dr. Russella Dar.  Tubular adenoma and internal hemorrhoids; follow-up in 7 years recommended. (Prior colonoscopy was 08/2019) Last PSA:  Lab Results  Component Value Date   PSA1 1.4 07/06/2022   PSA1 1.2 06/28/2021  PSA1 1.2 06/15/2020  Dentist: twice yearly   Ophtho: yearly Exercise:   walks 2-3 days/week, 20-30 minutes. Occ weight-bearing activity.  Vitamin D-OH normal in 04/2015 (40)  PMH, PSH, SH and FH were reviewed and updated    ROS: The patient denies anorexia, fever, vision loss, decreased hearing, ear pain, hoarseness, chest pain, dizziness, syncope, dyspnea on exertion,  cough, swelling, nausea, vomiting, diarrhea, melena, hematochezia, hematuria, incontinence, erectile dysfunction, nocturia, weakened urine stream, dysuria, genital lesions, numbness, tingling, weakness, tremor, suspicious skin lesions, depression, anxiety, abnormal bleeding/bruising, or enlarged lymph nodes.   Herpes flares are infrequent. No headaches or neurologic issues. Insomnia--per HPI, infrequent Goes to the dermatologist yearly.  No current skin concerns. Heartburn periodically; also has intermittent RUQ pain, infrequent Still has issues with R elbow pain.    PHYSICAL EXAM:  There were no vitals taken for this visit.  Wt Readings from Last 3 Encounters:  01/25/23 238 lb 9.6 oz (108.2 kg)  08/22/22 238 lb (108 kg)  07/27/22 238 lb (108 kg)    General Appearance  Alert, cooperative, no distress, appears stated age    Head:   Normocephalic, without obvious abnormality, atraumatic    Eyes:   PERRL, conjunctiva/corneas clear, EOM's intact, fundi benign    Ears:   Normal TMs and EACs bilaterally  Nose:   No drainage or sinus tenderness  Throat:   Normal mucosa, no lesions  Neck:   Supple, no lymphadenopathy; thyroid: no enlargement/ tenderness/nodules; no carotid bruit or JVD    Back:   Spine nontender, no curvature, ROM normal, no CVA tenderness    Lungs:   Clear to auscultation bilaterally without wheezes, rales or ronchi; respirations unlabored   Chest Wall:   No tenderness or deformity    Heart:   Regular rate and rhythm, S1 and S2 normal, no murmur, rub or gallop    Breast Exam:  No chest wall tenderness, masses or gynecomastia   Abdomen:   Soft, non-tender, nondistended, normoactive bowel sounds,  no masses, no hepatosplenomegaly. +central abdominal obesity. Left sided umbilical hernia noted, nontender, reducible  Genitalia:   Normal male external genitalia without lesions. Testicles without masses.  No inguinal hernias.    Rectal:   Normal sphincter tone, no masses or  tenderness; guaiac negative stool. Prostate smooth, no nodules, not enlarged.    Extremities:   No clubbing, cyanosis or edema. Multiple small WHSS at right shoulder  Pulses:   2+ and symmetric all extremities    Skin:   Skin color, texture, turgor normal, no lesions.   Lymph nodes:   Cervical, supraclavicular, and inguinal nodes normal    Neurologic:   Alert and oriented. Normal strength, sensation and gait; reflexes 2+ and symmetric throughout                Psych:  Normal mood, affect, hygiene and grooming  UPDATE ***L sided umbilical hernia  ASSESSMENT/PLAN:  Tdap  Discuss Hep C test (ensure not blood donor, done elsewhere, that he's okay with doing)  Discussed PSA screening (risks/benefits), recommended at least 30 minutes of aerobic activity at least 5 days/week, weight-bearing exercise 2x/week; proper sunscreen use reviewed; healthy diet and alcohol recommendations (less than or equal to 2 drinks/day, even less if elevated LFT's pesist) reviewed; regular seatbelt use; changing batteries in smoke detectors. Immunization recommendations discussed--continue yearly flu shots through work TdaP given today Colonoscopy recommendations reviewed, UTD; due again 08/2029.   CPE 1 year, sooner prn based on lab results

## 2023-07-28 NOTE — Patient Instructions (Incomplete)
  HEALTH MAINTENANCE RECOMMENDATIONS:  It is recommended that you get at least 30 minutes of aerobic exercise at least 5 days/week (for weight loss, you may need as much as 60-90 minutes). This can be any activity that gets your heart rate up. This can be divided in 10-15 minute intervals if needed, but try and build up your endurance at least once a week.  Weight bearing exercise is also recommended twice weekly.  Eat a healthy diet with lots of vegetables, fruits and fiber.  "Colorful" foods have a lot of vitamins (ie green vegetables, tomatoes, red peppers, etc).  Limit sweet tea, regular sodas and alcoholic beverages, all of which has a lot of calories and sugar.  Up to 2 alcoholic drinks daily may be beneficial for men (unless trying to lose weight, watch sugars).  Drink a lot of water.  Sunscreen of at least SPF 30 should be used on all sun-exposed parts of the skin when outside between the hours of 10 am and 4 pm (not just when at beach or pool, but even with exercise, golf, tennis, and yard work!)  Use a sunscreen that says "broad spectrum" so it covers both UVA and UVB rays, and make sure to reapply every 1-2 hours.  Remember to change the batteries in your smoke detectors when changing your clock times in the spring and fall.  Carbon monoxide detectors are recommended for your home.  Use your seat belt every time you are in a car, and please drive safely and not be distracted with cell phones and texting while driving.    

## 2023-07-31 ENCOUNTER — Encounter: Payer: Self-pay | Admitting: Family Medicine

## 2023-07-31 ENCOUNTER — Encounter: Payer: Self-pay | Admitting: *Deleted

## 2023-07-31 ENCOUNTER — Ambulatory Visit (INDEPENDENT_AMBULATORY_CARE_PROVIDER_SITE_OTHER): Payer: Commercial Managed Care - PPO | Admitting: Family Medicine

## 2023-07-31 VITALS — BP 120/84 | HR 64 | Ht 73.0 in | Wt 236.8 lb

## 2023-07-31 DIAGNOSIS — Z1159 Encounter for screening for other viral diseases: Secondary | ICD-10-CM

## 2023-07-31 DIAGNOSIS — E782 Mixed hyperlipidemia: Secondary | ICD-10-CM | POA: Diagnosis not present

## 2023-07-31 DIAGNOSIS — Z6831 Body mass index (BMI) 31.0-31.9, adult: Secondary | ICD-10-CM

## 2023-07-31 DIAGNOSIS — R400 Somnolence: Secondary | ICD-10-CM

## 2023-07-31 DIAGNOSIS — G478 Other sleep disorders: Secondary | ICD-10-CM

## 2023-07-31 DIAGNOSIS — R0683 Snoring: Secondary | ICD-10-CM | POA: Diagnosis not present

## 2023-07-31 DIAGNOSIS — J302 Other seasonal allergic rhinitis: Secondary | ICD-10-CM | POA: Diagnosis not present

## 2023-07-31 DIAGNOSIS — Z5181 Encounter for therapeutic drug level monitoring: Secondary | ICD-10-CM | POA: Diagnosis not present

## 2023-07-31 DIAGNOSIS — Z23 Encounter for immunization: Secondary | ICD-10-CM

## 2023-07-31 DIAGNOSIS — Z0000 Encounter for general adult medical examination without abnormal findings: Secondary | ICD-10-CM | POA: Diagnosis not present

## 2023-07-31 DIAGNOSIS — Z125 Encounter for screening for malignant neoplasm of prostate: Secondary | ICD-10-CM | POA: Diagnosis not present

## 2023-08-01 ENCOUNTER — Other Ambulatory Visit (HOSPITAL_COMMUNITY): Payer: Self-pay

## 2023-08-01 ENCOUNTER — Other Ambulatory Visit: Payer: Self-pay | Admitting: Family Medicine

## 2023-08-01 DIAGNOSIS — E782 Mixed hyperlipidemia: Secondary | ICD-10-CM

## 2023-08-01 LAB — CBC WITH DIFFERENTIAL/PLATELET
Basophils Absolute: 0 10*3/uL (ref 0.0–0.2)
Basos: 0 %
EOS (ABSOLUTE): 0.3 10*3/uL (ref 0.0–0.4)
Eos: 4 %
Hematocrit: 50.7 % (ref 37.5–51.0)
Hemoglobin: 17 g/dL (ref 13.0–17.7)
Immature Grans (Abs): 0 10*3/uL (ref 0.0–0.1)
Immature Granulocytes: 0 %
Lymphocytes Absolute: 1.9 10*3/uL (ref 0.7–3.1)
Lymphs: 28 %
MCH: 31 pg (ref 26.6–33.0)
MCHC: 33.5 g/dL (ref 31.5–35.7)
MCV: 92 fL (ref 79–97)
Monocytes Absolute: 0.6 10*3/uL (ref 0.1–0.9)
Monocytes: 8 %
Neutrophils Absolute: 4 10*3/uL (ref 1.4–7.0)
Neutrophils: 60 %
Platelets: 227 10*3/uL (ref 150–450)
RBC: 5.49 x10E6/uL (ref 4.14–5.80)
RDW: 12.9 % (ref 11.6–15.4)
WBC: 6.9 10*3/uL (ref 3.4–10.8)

## 2023-08-01 LAB — CMP14+EGFR
ALT: 41 IU/L (ref 0–44)
AST: 42 IU/L — ABNORMAL HIGH (ref 0–40)
Albumin: 4.9 g/dL (ref 3.8–4.9)
Alkaline Phosphatase: 98 [IU]/L (ref 44–121)
BUN/Creatinine Ratio: 10 (ref 9–20)
BUN: 11 mg/dL (ref 6–24)
Bilirubin Total: 0.9 mg/dL (ref 0.0–1.2)
CO2: 24 mmol/L (ref 20–29)
Calcium: 9.8 mg/dL (ref 8.7–10.2)
Chloride: 101 mmol/L (ref 96–106)
Creatinine, Ser: 1.12 mg/dL (ref 0.76–1.27)
Globulin, Total: 2.6 g/dL (ref 1.5–4.5)
Glucose: 101 mg/dL — ABNORMAL HIGH (ref 70–99)
Potassium: 4.8 mmol/L (ref 3.5–5.2)
Sodium: 141 mmol/L (ref 134–144)
Total Protein: 7.5 g/dL (ref 6.0–8.5)
eGFR: 79 mL/min/{1.73_m2} (ref >59)

## 2023-08-01 LAB — LIPID PANEL
Chol/HDL Ratio: 4.2 ratio (ref 0.0–5.0)
Cholesterol, Total: 221 mg/dL — ABNORMAL HIGH (ref 100–199)
HDL: 53 mg/dL (ref >39)
LDL Chol Calc (NIH): 146 mg/dL — ABNORMAL HIGH (ref 0–99)
Triglycerides: 125 mg/dL (ref 0–149)
VLDL Cholesterol Cal: 22 mg/dL (ref 5–40)

## 2023-08-01 LAB — PSA: Prostate Specific Ag, Serum: 1.1 ng/mL (ref 0.0–4.0)

## 2023-08-01 MED ORDER — ATORVASTATIN CALCIUM 80 MG PO TABS
80.0000 mg | ORAL_TABLET | Freq: Every day | ORAL | 1 refills | Status: DC
  Filled 2023-08-01 – 2023-10-18 (×2): qty 90, 90d supply, fill #0
  Filled 2024-02-06: qty 90, 90d supply, fill #1

## 2023-08-03 ENCOUNTER — Other Ambulatory Visit (HOSPITAL_COMMUNITY): Payer: Self-pay

## 2023-08-03 ENCOUNTER — Other Ambulatory Visit: Payer: Self-pay | Admitting: *Deleted

## 2023-08-03 ENCOUNTER — Encounter: Payer: Self-pay | Admitting: Family Medicine

## 2023-08-03 DIAGNOSIS — E782 Mixed hyperlipidemia: Secondary | ICD-10-CM

## 2023-08-03 MED ORDER — EZETIMIBE 10 MG PO TABS
10.0000 mg | ORAL_TABLET | Freq: Every day | ORAL | 3 refills | Status: DC
  Filled 2023-08-03: qty 30, 30d supply, fill #0
  Filled 2023-08-29: qty 30, 30d supply, fill #1
  Filled 2023-10-18: qty 30, 30d supply, fill #2

## 2023-08-03 NOTE — Telephone Encounter (Signed)
Rx sent. Please schedule fasting labs for 3 months, and order future fasting lipid panel.  Thanks

## 2023-09-04 ENCOUNTER — Other Ambulatory Visit (HOSPITAL_COMMUNITY): Payer: Self-pay | Attending: Oncology

## 2023-10-03 ENCOUNTER — Ambulatory Visit (HOSPITAL_BASED_OUTPATIENT_CLINIC_OR_DEPARTMENT_OTHER): Payer: Commercial Managed Care - PPO | Attending: Family Medicine | Admitting: Internal Medicine

## 2023-10-03 DIAGNOSIS — G478 Other sleep disorders: Secondary | ICD-10-CM | POA: Diagnosis not present

## 2023-10-03 DIAGNOSIS — R400 Somnolence: Secondary | ICD-10-CM | POA: Diagnosis not present

## 2023-10-03 DIAGNOSIS — R0683 Snoring: Secondary | ICD-10-CM

## 2023-10-03 DIAGNOSIS — G4733 Obstructive sleep apnea (adult) (pediatric): Secondary | ICD-10-CM | POA: Diagnosis not present

## 2023-10-14 DIAGNOSIS — G478 Other sleep disorders: Secondary | ICD-10-CM

## 2023-10-14 NOTE — Procedures (Signed)
     Patient Name: Larry Cruz, Larry Cruz Date: 10/03/2023 Gender: Male D.O.B: 1971-05-10 Age (years): 30 Referring Provider: Annabelle Fetters Height (inches): 73 Interpreting Physician: Reggy Salt MD, ABSM Weight (lbs): 236 RPSGT: Bonni Misty BMI: 31 MRN: [id number] Neck Size: <br>  CLINICAL INFORMATION Sleep Study Type: HST Indication for sleep study: Snoring Epworth Sleepiness Score: 4  SLEEP STUDY TECHNIQUE A multi-channel overnight portable sleep study was performed. The channels recorded were: nasal airflow, thoracic respiratory movement, and oxygen saturation with a pulse oximetry. Snoring was also monitored.  MEDICATIONS Patient self administered medications include: none reported.  SLEEP ARCHITECTURE Patient was studied for 365 minutes. The sleep efficiency was 100.0 % and the patient was supine for 0%. The arousal index was 0.0 per hour.  RESPIRATORY PARAMETERS The overall AHI was 6.1 per hour, with a central apnea index of 0 per hour. The oxygen nadir was 90% during sleep.  CARDIAC DATA Mean heart rate during sleep was 57.3 bpm.  IMPRESSIONS - Mild obstructive sleep apnea occurred during this study (AHI = 6.1/h). - The patient had minimal or no oxygen desaturation during the study (Min O2 = 90%) - Patient snored.  DIAGNOSIS - Obstructive Sleep Apnea (G47.33)  RECOMMENDATIONS - Conservative options for very mild OSA may include observation, weight loss, and sleep position off back.  Other options may include CPAP, a fitted oral appliance or ENT evaluation based on clinical judgment. - Be careful with alcohol, sedatives and other CNS depressants that may worsen sleep apnea and disrupt normal sleep architecture. - Sleep hygiene should be reviewed to assess factors that may improve sleep quality. - Weight management and regular exercise should be initiated or continued.  [Electronically signed] 10/14/2023 11:46 AM  Reggy Salt MD, ABSM Diplomate, American  Board of Sleep Medicine NPI: [id number]                        Reggy Salt Diplomate, American Board of Sleep Medicine  ELECTRONICALLY SIGNED ON:  10/14/2023, 11:55 AM [facility] PH: [phone]   FX: [phone] ACCREDITED BY THE AMERICAN ACADEMY OF SLEEP MEDICINE

## 2023-10-16 ENCOUNTER — Encounter: Payer: Self-pay | Admitting: Family Medicine

## 2023-10-18 ENCOUNTER — Other Ambulatory Visit (HOSPITAL_COMMUNITY): Payer: Self-pay

## 2023-11-03 ENCOUNTER — Other Ambulatory Visit: Payer: Commercial Managed Care - PPO

## 2023-11-03 DIAGNOSIS — E782 Mixed hyperlipidemia: Secondary | ICD-10-CM | POA: Diagnosis not present

## 2023-11-03 LAB — LIPID PANEL
Chol/HDL Ratio: 3.3 {ratio} (ref 0.0–5.0)
Cholesterol, Total: 137 mg/dL (ref 100–199)
HDL: 41 mg/dL (ref >39)
LDL Chol Calc (NIH): 73 mg/dL (ref 0–99)
Triglycerides: 131 mg/dL (ref 0–149)
VLDL Cholesterol Cal: 23 mg/dL (ref 5–40)

## 2023-11-04 ENCOUNTER — Encounter: Payer: Self-pay | Admitting: Family Medicine

## 2023-11-04 ENCOUNTER — Other Ambulatory Visit: Payer: Self-pay | Admitting: Family Medicine

## 2023-11-04 ENCOUNTER — Other Ambulatory Visit (HOSPITAL_COMMUNITY): Payer: Self-pay

## 2023-11-04 DIAGNOSIS — E782 Mixed hyperlipidemia: Secondary | ICD-10-CM

## 2023-11-04 MED ORDER — EZETIMIBE 10 MG PO TABS
10.0000 mg | ORAL_TABLET | Freq: Every day | ORAL | 2 refills | Status: DC
  Filled 2023-11-04 – 2023-11-14 (×2): qty 90, 90d supply, fill #0
  Filled 2024-02-06: qty 90, 90d supply, fill #1
  Filled 2024-05-10: qty 90, 90d supply, fill #2

## 2023-11-06 ENCOUNTER — Other Ambulatory Visit (HOSPITAL_COMMUNITY): Payer: Self-pay

## 2023-11-14 ENCOUNTER — Other Ambulatory Visit (HOSPITAL_COMMUNITY): Payer: Self-pay

## 2024-05-10 ENCOUNTER — Other Ambulatory Visit: Payer: Self-pay | Admitting: Family Medicine

## 2024-05-10 ENCOUNTER — Other Ambulatory Visit (HOSPITAL_COMMUNITY): Payer: Self-pay

## 2024-05-10 ENCOUNTER — Other Ambulatory Visit: Payer: Self-pay

## 2024-05-10 DIAGNOSIS — E782 Mixed hyperlipidemia: Secondary | ICD-10-CM

## 2024-05-10 DIAGNOSIS — A6000 Herpesviral infection of urogenital system, unspecified: Secondary | ICD-10-CM

## 2024-05-10 MED ORDER — ATORVASTATIN CALCIUM 80 MG PO TABS
80.0000 mg | ORAL_TABLET | Freq: Every day | ORAL | 0 refills | Status: DC
Start: 2024-05-10 — End: 2024-08-05
  Filled 2024-05-10: qty 90, 90d supply, fill #0

## 2024-05-10 MED ORDER — VALACYCLOVIR HCL 500 MG PO TABS
500.0000 mg | ORAL_TABLET | Freq: Two times a day (BID) | ORAL | 0 refills | Status: AC | PRN
  Filled 2024-05-10: qty 30, 15d supply, fill #0

## 2024-07-19 ENCOUNTER — Other Ambulatory Visit: Payer: Self-pay | Admitting: Medical Genetics

## 2024-07-19 DIAGNOSIS — Z006 Encounter for examination for normal comparison and control in clinical research program: Secondary | ICD-10-CM

## 2024-08-05 DIAGNOSIS — G4733 Obstructive sleep apnea (adult) (pediatric): Secondary | ICD-10-CM | POA: Insufficient documentation

## 2024-08-05 NOTE — Progress Notes (Unsigned)
 No chief complaint on file.   Larry Cruz is a 53 y.o. male who presents for a complete physical.   Mild OSA, snoring, noted on sleep study 09/2023.   He wakes up refreshed most of the time, denies daytime somnolence *** napping in recliner after work sometimes?? ***UPDATE  Hyperlipidemia follow-up:  Zetia  was added to atorvastatin  80mg  last year when lipids were above goal. Recheck in 10/2023 was significantly improved.  He denies side effects to medications. Omega-3 fish oil ***?  Fatty liver changes were noted on abdominal ultrasound in June 2021, with h/o elevated LFT's. He had calcium  score of 0 in 08/2022.   ***UPDATE ALL DIET He previously reported eating red meat to 2-3x/week, +cheese (sandwich, snacks), eats eggs less often (prev 2-3 eggs/week), occasional ice cream; Italian/vinaigrette dressings, no mayo.  Busy with travel for son's soccer, and also daughter in marching band.  Eating less fast food overall, still sporadic. Today he reports sometimes going a week or two without fast food/beef, sometimes has 3x/week.  He doesn't order fries, eats some of his family's.  Drinks mostly water, coffee in the morning.  Component Ref Range & Units (hover) 9 mo ago (11/03/23) 1 yr ago (07/31/23) 2 yr ago (07/06/22) 2 yr ago (11/22/21) 3 yr ago (04/22/21) 3 yr ago (11/23/20) 4 yr ago (06/15/20)  Cholesterol, Total 137 221 High  220 High  209 High  209 High  205 High  240 High   Triglycerides 131 125 232 High  141 156 High  119 186 High   HDL 41 53 53 48 53 44 55  VLDL Cholesterol Cal 23 22 41 High  25 28 21  34  LDL Chol Calc (NIH) 73 146 High  126 High  136 High  128 High  140 High  151 High   Chol/HDL Ratio 3.3 4.2 CM 4.2 CM 4.4 CM 3.9 CM 4.7 CM 4.4 CM   Lab Results  Component Value Date   ALT 41 07/31/2023   AST 42 (H) 07/31/2023   ALKPHOS 98 07/31/2023   BILITOT 0.9 07/31/2023    GERD:  He reports having some intermittent reflux symptoms. Uses an OTC Nexium prn (every few  weeks).  Unable to identify triggers, has some spicy foods.    He sometimes gets RUQ pain, not related to eating.  Sometimes this occurs on an empty stomach, when due to eat.  Not epigastric.  Hasn't been as often over the last 6 months. Has had x 2-3 years (discussed at prior visits).  He usually feels better laying down, rather than sitting/standing.  He takes Aleve most nights due to shoulder discomfort, for about 3-4 months.  Not taking with food, since taking it at night. ***UPDATE  Insomnia--intermittent.  Last filled #30 on 09/13/22. He rarely needs this, doesn't use often--wife complains about his snoring more when he takes it.  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  Last refilled Valtrex  #30 04/2024. As long as he gets adequate sleep, he doesn't have outbreaks.  Has less than once a year.  H/o Right lateral epicondylitis, s/p percutaneous release by Dr. Camella.  Both shoulders cause discomfort mainly with sleeping.  Immunization History  Administered Date(s) Administered   Influenza Split 07/03/2012, 07/03/2014, 06/20/2019   Influenza-Unspecified 06/19/2018, 06/14/2021, 07/13/2022, 06/20/2023   Moderna Covid-19 Vaccine Bivalent Booster 20yrs & up 06/28/2021   Tdap 10/22/2012, 07/31/2023   Zoster Recombinant(Shingrix ) 11/22/2021, 08/09/2022   Has had 3 COVID vaccines, booster in 08/2020. We have no  records, not in [place], thinks was [employer].  Had reaction to the Moderna 06/2021 (severe HA ad fever), declines further boosters. Gets flu shots yearly through work [employer] employee) Last colonoscopy: 08/2022 with Dr. Aneita.  Tubular adenoma and internal hemorrhoids; follow-up in 7 years recommended. (Prior colonoscopy was 08/2019) Last PSA:  Lab Results  Component Value Date   PSA1 1.1 07/31/2023   PSA1 1.4 07/06/2022   PSA1 1.2 06/28/2021  Dentist: twice yearly   Ophtho: yearly Exercise:    not as much as I should be.  Better in the summer. walks 2-3 days/week, 20-30  minutes. Occ weight-bearing activity.  Vitamin D -OH normal in 04/2015 (40)  PMH, PSH, SH and FH were reviewed and updated    ROS: The patient denies anorexia, fever, vision loss, decreased hearing, ear pain, hoarseness, chest pain, dizziness, syncope, dyspnea on exertion, cough, swelling, nausea, vomiting, diarrhea, melena, hematochezia, hematuria, incontinence, erectile dysfunction, nocturia, weakened urine stream, dysuria, genital lesions, numbness, tingling, weakness, tremor, suspicious skin lesions, depression, anxiety, abnormal bleeding/bruising, or enlarged lymph nodes.   Herpes flares are infrequent. No headaches or neurologic issues. Rare insomnia Goes to the dermatologist yearly.  No current skin concerns. Recently went Heartburn periodically; also has intermittent RUQ pain, infrequent Bilateral shoulder pain at night. Denies pain to umbilical hernia. Snoring, unrefreshed sleep intermittently, per HPI   PHYSICAL EXAM:  There were no vitals taken for this visit.  Wt Readings from Last 3 Encounters:  07/31/23 236 lb 12.8 oz (107.4 kg)  01/25/23 238 lb 9.6 oz (108.2 kg)  08/22/22 238 lb (108 kg)    General Appearance  Alert, cooperative, no distress, appears stated age    Head:   Normocephalic, without obvious abnormality, atraumatic    Eyes:   PERRL, conjunctiva/corneas clear, EOM's intact, fundi benign    Ears:   Normal TMs and EACs bilaterally  Nose:   Mod edema with clear mucus bilaterally. No erythema, purulence or sinus tenderness  Throat:   Normal mucosa, no lesions  Neck:   Supple, no lymphadenopathy; thyroid : no enlargement/ tenderness/nodules; no carotid bruit or JVD    Back:   Spine nontender, no curvature, ROM normal, no CVA tenderness    Lungs:   Clear to auscultation bilaterally without wheezes, rales or ronchi; respirations unlabored   Chest Wall:   No tenderness or deformity    Heart:   Regular rate and rhythm, S1 and S2 normal, no murmur, rub or gallop     Breast Exam:  No chest wall tenderness, masses or gynecomastia   Abdomen:   Soft, non-tender, nondistended, normoactive bowel sounds,  no masses, no hepatosplenomegaly. +central abdominal obesity. Left sided umbilical hernia noted, nontender, reducible  Genitalia:   Normal male external genitalia without lesions. Testicles without masses.  No inguinal hernias.    Rectal:   Normal sphincter tone, no masses or tenderness; guaiac negative stool. Prostate smooth, no nodules, not enlarged.    Extremities:   No clubbing, cyanosis or edema. Multiple small WHSS at right shoulder  Pulses:   2+ and symmetric all extremities    Skin:   Skin color, texture, turgor normal, no lesions. Scab at L upper cheek (recent LN2 treatment by derm)  Lymph nodes:   Cervical, supraclavicular, and inguinal nodes normal    Neurologic:   Alert and oriented. Normal strength, sensation and gait; reflexes 2+ and symmetric throughout                Psych:  Normal mood, affect, hygiene and  grooming    ASSESSMENT/PLAN:  Document flu shot Prevnar-20 Add coQ10 to med list if still taking  Cbc, c-met, lipids, PSA TSH if sx  Discussed PSA screening (risks/benefits), recommended at least 30 minutes of aerobic activity at least 5 days/week, weight-bearing exercise 2x/week; proper sunscreen use reviewed; healthy diet and alcohol recommendations (less than or equal to 2 drinks/day, even less if elevated LFT's pesist) reviewed; regular seatbelt use; changing batteries in smoke detectors. Immunization recommendations discussed--continue yearly flu shots through work. Declines COVID booster. Prevnar-20 *** Colonoscopy recommendations reviewed, UTD; due again 08/2029.   CPE 1 year, sooner prn based on lab results

## 2024-08-05 NOTE — Patient Instructions (Signed)
   HEALTH MAINTENANCE RECOMMENDATIONS:  It is recommended that you get at least 30 minutes of aerobic exercise at least 5 days/week (for weight loss, you may need as much as 60-90 minutes). This can be any activity that gets your heart rate up. This can be divided in 10-15 minute intervals if needed, but try and build up your endurance at least once a week.  Weight bearing exercise is also recommended twice weekly.  Eat a healthy diet with lots of vegetables, fruits and fiber.  "Colorful" foods have a lot of vitamins (ie green vegetables, tomatoes, red peppers, etc).  Limit sweet tea, regular sodas and alcoholic beverages, all of which has a lot of calories and sugar.  Up to 2 alcoholic drinks daily may be beneficial for men (unless trying to lose weight, watch sugars).  Drink a lot of water.  Sunscreen of at least SPF 30 should be used on all sun-exposed parts of the skin when outside between the hours of 10 am and 4 pm (not just when at beach or pool, but even with exercise, golf, tennis, and yard work!)  Use a sunscreen that says "broad spectrum" so it covers both UVA and UVB rays, and make sure to reapply every 1-2 hours.  Remember to change the batteries in your smoke detectors when changing your clock times in the spring and fall.  Carbon monoxide detectors are recommended for your home.  Use your seat belt every time you are in a car, and please drive safely and not be distracted with cell phones and texting while driving.

## 2024-08-06 DIAGNOSIS — Z86018 Personal history of other benign neoplasm: Secondary | ICD-10-CM | POA: Diagnosis not present

## 2024-08-06 DIAGNOSIS — L578 Other skin changes due to chronic exposure to nonionizing radiation: Secondary | ICD-10-CM | POA: Diagnosis not present

## 2024-08-06 DIAGNOSIS — L570 Actinic keratosis: Secondary | ICD-10-CM | POA: Diagnosis not present

## 2024-08-06 DIAGNOSIS — D2272 Melanocytic nevi of left lower limb, including hip: Secondary | ICD-10-CM | POA: Diagnosis not present

## 2024-08-06 DIAGNOSIS — D225 Melanocytic nevi of trunk: Secondary | ICD-10-CM | POA: Diagnosis not present

## 2024-08-06 DIAGNOSIS — L814 Other melanin hyperpigmentation: Secondary | ICD-10-CM | POA: Diagnosis not present

## 2024-08-07 ENCOUNTER — Other Ambulatory Visit (HOSPITAL_COMMUNITY): Payer: Self-pay

## 2024-08-07 ENCOUNTER — Encounter: Payer: Self-pay | Admitting: Family Medicine

## 2024-08-07 ENCOUNTER — Ambulatory Visit (INDEPENDENT_AMBULATORY_CARE_PROVIDER_SITE_OTHER): Payer: Commercial Managed Care - PPO | Admitting: Family Medicine

## 2024-08-07 VITALS — BP 110/70 | HR 60 | Ht 73.0 in | Wt 226.4 lb

## 2024-08-07 DIAGNOSIS — Z0000 Encounter for general adult medical examination without abnormal findings: Secondary | ICD-10-CM

## 2024-08-07 DIAGNOSIS — G4733 Obstructive sleep apnea (adult) (pediatric): Secondary | ICD-10-CM

## 2024-08-07 DIAGNOSIS — Z125 Encounter for screening for malignant neoplasm of prostate: Secondary | ICD-10-CM | POA: Diagnosis not present

## 2024-08-07 DIAGNOSIS — Z23 Encounter for immunization: Secondary | ICD-10-CM | POA: Diagnosis not present

## 2024-08-07 DIAGNOSIS — A6000 Herpesviral infection of urogenital system, unspecified: Secondary | ICD-10-CM | POA: Diagnosis not present

## 2024-08-07 DIAGNOSIS — E782 Mixed hyperlipidemia: Secondary | ICD-10-CM

## 2024-08-07 DIAGNOSIS — Z6829 Body mass index (BMI) 29.0-29.9, adult: Secondary | ICD-10-CM | POA: Diagnosis not present

## 2024-08-07 DIAGNOSIS — Z5181 Encounter for therapeutic drug level monitoring: Secondary | ICD-10-CM | POA: Diagnosis not present

## 2024-08-07 DIAGNOSIS — K760 Fatty (change of) liver, not elsewhere classified: Secondary | ICD-10-CM | POA: Diagnosis not present

## 2024-08-07 MED ORDER — EZETIMIBE 10 MG PO TABS
10.0000 mg | ORAL_TABLET | Freq: Every day | ORAL | 3 refills | Status: AC
  Filled 2024-08-07: qty 90, 90d supply, fill #0

## 2024-08-07 MED ORDER — ATORVASTATIN CALCIUM 80 MG PO TABS
80.0000 mg | ORAL_TABLET | Freq: Every day | ORAL | 3 refills | Status: AC
  Filled 2024-08-07: qty 90, 90d supply, fill #0

## 2024-08-08 ENCOUNTER — Ambulatory Visit: Payer: Self-pay | Admitting: Family Medicine

## 2024-08-08 LAB — CBC WITH DIFFERENTIAL/PLATELET
Basophils Absolute: 0 x10E3/uL (ref 0.0–0.2)
Basos: 0 %
EOS (ABSOLUTE): 0.2 x10E3/uL (ref 0.0–0.4)
Eos: 4 %
Hematocrit: 50.4 % (ref 37.5–51.0)
Hemoglobin: 17.2 g/dL (ref 13.0–17.7)
Immature Grans (Abs): 0 x10E3/uL (ref 0.0–0.1)
Immature Granulocytes: 0 %
Lymphocytes Absolute: 1.9 x10E3/uL (ref 0.7–3.1)
Lymphs: 29 %
MCH: 32.3 pg (ref 26.6–33.0)
MCHC: 34.1 g/dL (ref 31.5–35.7)
MCV: 95 fL (ref 79–97)
Monocytes Absolute: 0.5 x10E3/uL (ref 0.1–0.9)
Monocytes: 8 %
Neutrophils Absolute: 3.9 x10E3/uL (ref 1.4–7.0)
Neutrophils: 59 %
Platelets: 220 x10E3/uL (ref 150–450)
RBC: 5.32 x10E6/uL (ref 4.14–5.80)
RDW: 13.2 % (ref 11.6–15.4)
WBC: 6.6 x10E3/uL (ref 3.4–10.8)

## 2024-08-08 LAB — COMPREHENSIVE METABOLIC PANEL WITH GFR
ALT: 47 IU/L — ABNORMAL HIGH (ref 0–44)
AST: 46 IU/L — ABNORMAL HIGH (ref 0–40)
Albumin: 5.1 g/dL — ABNORMAL HIGH (ref 3.8–4.9)
Alkaline Phosphatase: 101 IU/L (ref 47–123)
BUN/Creatinine Ratio: 11 (ref 9–20)
BUN: 13 mg/dL (ref 6–24)
Bilirubin Total: 1.1 mg/dL (ref 0.0–1.2)
CO2: 24 mmol/L (ref 20–29)
Calcium: 10.1 mg/dL (ref 8.7–10.2)
Chloride: 96 mmol/L (ref 96–106)
Creatinine, Ser: 1.19 mg/dL (ref 0.76–1.27)
Globulin, Total: 2.7 g/dL (ref 1.5–4.5)
Glucose: 102 mg/dL — ABNORMAL HIGH (ref 70–99)
Potassium: 4.5 mmol/L (ref 3.5–5.2)
Sodium: 139 mmol/L (ref 134–144)
Total Protein: 7.8 g/dL (ref 6.0–8.5)
eGFR: 72 mL/min/1.73 (ref >59)

## 2024-08-08 LAB — LIPID PANEL
Chol/HDL Ratio: 3.4 ratio (ref 0.0–5.0)
Cholesterol, Total: 181 mg/dL (ref 100–199)
HDL: 54 mg/dL (ref >39)
LDL Chol Calc (NIH): 99 mg/dL (ref 0–99)
Triglycerides: 160 mg/dL — ABNORMAL HIGH (ref 0–149)
VLDL Cholesterol Cal: 28 mg/dL (ref 5–40)

## 2024-08-08 LAB — PSA: Prostate Specific Ag, Serum: 1.4 ng/mL (ref 0.0–4.0)

## 2024-08-24 LAB — GENECONNECT MOLECULAR SCREEN: Genetic Analysis Overall Interpretation: NEGATIVE

## 2024-09-25 ENCOUNTER — Other Ambulatory Visit (HOSPITAL_COMMUNITY): Payer: Self-pay

## 2025-09-01 ENCOUNTER — Encounter: Admitting: Family Medicine
